# Patient Record
Sex: Female | Born: 1945 | Race: Black or African American | Hispanic: No | Smoking: Never smoker
Health system: Southern US, Community
[De-identification: ages and names within clinical notes are randomized; demographics above are authoritative.]

## PROBLEM LIST (undated history)

## (undated) DIAGNOSIS — N289 Disorder of kidney and ureter, unspecified: Secondary | ICD-10-CM

## (undated) DIAGNOSIS — I4891 Unspecified atrial fibrillation: Secondary | ICD-10-CM

## (undated) DIAGNOSIS — H269 Unspecified cataract: Secondary | ICD-10-CM

## (undated) DIAGNOSIS — I10 Essential (primary) hypertension: Secondary | ICD-10-CM

## (undated) DIAGNOSIS — I639 Cerebral infarction, unspecified: Secondary | ICD-10-CM

## (undated) DIAGNOSIS — H409 Unspecified glaucoma: Secondary | ICD-10-CM

---

## 2004-09-29 ENCOUNTER — Emergency Department (HOSPITAL_COMMUNITY): Admission: EM | Admit: 2004-09-29 | Discharge: 2004-09-29 | Payer: Self-pay | Admitting: Family Medicine

## 2004-11-19 ENCOUNTER — Encounter: Admission: RE | Admit: 2004-11-19 | Discharge: 2005-02-17 | Payer: Self-pay | Admitting: Internal Medicine

## 2007-05-20 ENCOUNTER — Emergency Department (HOSPITAL_COMMUNITY): Admission: EM | Admit: 2007-05-20 | Discharge: 2007-05-20 | Payer: Self-pay | Admitting: Emergency Medicine

## 2010-10-30 ENCOUNTER — Other Ambulatory Visit: Payer: Self-pay | Admitting: Orthopedic Surgery

## 2010-10-30 ENCOUNTER — Ambulatory Visit
Admission: RE | Admit: 2010-10-30 | Discharge: 2010-10-30 | Disposition: A | Discharge status: Home / Self Care | Payer: Medicare HMO | Source: Ambulatory Visit | Attending: Orthopedic Surgery | Admitting: Orthopedic Surgery

## 2010-10-30 DIAGNOSIS — M25569 Pain in unspecified knee: Secondary | ICD-10-CM

## 2010-10-30 DIAGNOSIS — M25559 Pain in unspecified hip: Secondary | ICD-10-CM

## 2011-05-22 LAB — DIFFERENTIAL
Basophils Absolute: 0
Eosinophils Absolute: 0.2
Eosinophils Relative: 3
Lymphocytes Relative: 35
Monocytes Relative: 11
Neutro Abs: 2.6

## 2011-05-22 LAB — CBC
Hemoglobin: 14.3
MCHC: 33.3
MCV: 88.6
RDW: 13.2
WBC: 5.2

## 2011-05-22 LAB — BASIC METABOLIC PANEL
CO2: 30
GFR calc Af Amer: >60
GFR calc non Af Amer: 54 — ABNORMAL LOW
Potassium: 4.2
Sodium: 139

## 2012-05-17 ENCOUNTER — Emergency Department (HOSPITAL_COMMUNITY)
Admission: EM | Admit: 2012-05-17 | Discharge: 2012-05-18 | Disposition: A | Discharge status: Home / Self Care | Payer: Medicare HMO | Attending: Emergency Medicine | Admitting: Emergency Medicine

## 2012-05-17 ENCOUNTER — Encounter (HOSPITAL_COMMUNITY): Payer: Self-pay | Admitting: *Deleted

## 2012-05-17 ENCOUNTER — Encounter (INDEPENDENT_AMBULATORY_CARE_PROVIDER_SITE_OTHER): Payer: Medicare HMO | Admitting: Ophthalmology

## 2012-05-17 DIAGNOSIS — I10 Essential (primary) hypertension: Secondary | ICD-10-CM

## 2012-05-17 DIAGNOSIS — I129 Hypertensive chronic kidney disease with stage 1 through stage 4 chronic kidney disease, or unspecified chronic kidney disease: Secondary | ICD-10-CM | POA: Insufficient documentation

## 2012-05-17 DIAGNOSIS — N183 Chronic kidney disease, stage 3 unspecified: Secondary | ICD-10-CM

## 2012-05-17 DIAGNOSIS — H40019 Open angle with borderline findings, low risk, unspecified eye: Secondary | ICD-10-CM

## 2012-05-17 DIAGNOSIS — E1139 Type 2 diabetes mellitus with other diabetic ophthalmic complication: Secondary | ICD-10-CM

## 2012-05-17 DIAGNOSIS — H35039 Hypertensive retinopathy, unspecified eye: Secondary | ICD-10-CM

## 2012-05-17 DIAGNOSIS — E119 Type 2 diabetes mellitus without complications: Secondary | ICD-10-CM

## 2012-05-17 DIAGNOSIS — H2510 Age-related nuclear cataract, unspecified eye: Secondary | ICD-10-CM

## 2012-05-17 DIAGNOSIS — H3581 Retinal edema: Secondary | ICD-10-CM

## 2012-05-17 DIAGNOSIS — E11319 Type 2 diabetes mellitus with unspecified diabetic retinopathy without macular edema: Secondary | ICD-10-CM

## 2012-05-17 DIAGNOSIS — H43819 Vitreous degeneration, unspecified eye: Secondary | ICD-10-CM

## 2012-05-17 HISTORY — DX: Unspecified glaucoma: H40.9

## 2012-05-17 HISTORY — DX: Essential (primary) hypertension: I10

## 2012-05-17 HISTORY — DX: Unspecified cataract: H26.9

## 2012-05-17 LAB — COMPREHENSIVE METABOLIC PANEL
ALT: 18 U/L (ref 0–35)
AST: 21 U/L (ref 0–37)
Albumin: 3.6 g/dL (ref 3.5–5.2)
Alkaline Phosphatase: 88 U/L (ref 39–117)
CO2: 29 mEq/L (ref 19–32)
Calcium: 10.1 mg/dL (ref 8.4–10.5)
GFR calc non Af Amer: 35 mL/min — ABNORMAL LOW (ref >90)
Glucose, Bld: 160 mg/dL — ABNORMAL HIGH (ref 70–99)
Sodium: 144 mEq/L (ref 135–145)

## 2012-05-17 LAB — CBC
HCT: 39.7 % (ref 36.0–46.0)
MCHC: 34 g/dL (ref 30.0–36.0)
Platelets: 390 10*3/uL (ref 150–400)
RBC: 4.58 MIL/uL (ref 3.87–5.11)

## 2012-05-17 MED ORDER — LABETALOL HCL 5 MG/ML IV SOLN
10.0000 mg | Freq: Once | INTRAVENOUS | Status: AC
  Administered 2012-05-17: 10 mg via INTRAVENOUS
  Filled 2012-05-17: qty 4

## 2012-05-17 NOTE — ED Provider Notes (Signed)
History     CSN: [id number]  Arrival date & time 05/17/12  1728   First MD Initiated Contact with Patient 05/17/12 2144      Chief Complaint  Patient presents with  . Hypertension    (Consider location/radiation/quality/duration/timing/severity/associated sxs/prior treatment) HPI  66 y.o. female in no acute distress sent here by Dr. Ashley Royalty her ophthalmologist who is evaluating her for her diabetic retinopathy for elevated blood pressure. Pressures in the office for 190s over the 110s. Patient is being treated for hypertension with amlodipine and hydrochlorothiazide she took both of her medications this a.m. Patient is asymptomatic: Denies chest pain, abdominal pain, palpitations, headache, change in vision, focal weakness,dysarthria, nausea/vomiting. Patient states that she recently went on vacation and has been eating very salty foods.  PCP: Dr Doristine Locks  Past Medical History  Diagnosis Date  . Hypertension   . Diabetes mellitus   . Glaucoma   . Cataract     History reviewed. No pertinent past surgical history.  No family history on file.  History  Substance Use Topics  . Smoking status: Never Smoker   . Smokeless tobacco: Not on file  . Alcohol Use: No    OB History    Grav Para Term Preterm Abortions TAB SAB Ect Mult Living                  Review of Systems  Constitutional: Negative for fever.  Respiratory: Negative for shortness of breath.   Cardiovascular: Negative for chest pain.  Gastrointestinal: Negative for nausea, vomiting, abdominal pain and diarrhea.  All other systems reviewed and are negative.    Allergies  Review of patient's allergies indicates no known allergies.  Home Medications   Current Outpatient Rx  Name Route Sig Dispense Refill  . AMLODIPINE BESY-BENAZEPRIL HCL 10-20 MG PO CAPS Oral Take 1 capsule by mouth daily.    Marland Kitchen HYDROCHLOROTHIAZIDE 25 MG PO TABS Oral Take 25 mg by mouth daily.    Marland Kitchen METFORMIN HCL 500 MG PO TABS Oral Take  500 mg by mouth 2 (two) times daily with a meal.      BP 194/114  Pulse 94  Temp 97.8 F (36.6 C) (Oral)  Resp 20  SpO2 98%  Physical Exam  Nursing note and vitals reviewed. Constitutional: She is oriented to person, place, and time. She appears well-developed and well-nourished. No distress.  HENT:  Head: Normocephalic.  Eyes: Conjunctivae normal and EOM are normal.  Cardiovascular: Normal rate, regular rhythm, normal heart sounds and intact distal pulses.  Exam reveals no gallop and no friction rub.   No murmur heard.      Pressure is 160s over 100 at evaluation  Pulmonary/Chest: Effort normal and breath sounds normal. No stridor. No respiratory distress. She has no wheezes. She has no rales. She exhibits no tenderness.  Abdominal: Soft. Bowel sounds are normal. She exhibits no distension and no mass. There is no tenderness. There is no rebound and no guarding.  Musculoskeletal: Normal range of motion.  Neurological: She is alert and oriented to person, place, and time.       Cranial nerves III through XII intact, strength 5 out of 5x4 extremities, negative pronator drift, finger to nose and heel-to-shin coordinated, sensation intact to pinprick and light touch, gait is coordinated and Romberg is negative.   Psychiatric: She has a normal mood and affect.    ED Course  Procedures (including critical care time)  Labs Reviewed  COMPREHENSIVE METABOLIC PANEL - Abnormal; Notable for  the following:    Glucose, Bld 160 (*)     BUN 33 (*)     Creatinine, Ser 1.55 (*)     GFR calc non Af Amer 35 (*)     GFR calc Af Amer 40 (*)     All other components within normal limits  CBC   No results found.   1. Hypertension   2. CKD (chronic kidney disease) stage 3, GFR 30-59 ml/min   3. Diabetes mellitus       MDM  Asymptomatic elevated blood pressure.   Creatinine is elevated to 1.55 baseline is unknown. Urinalysis shows proteinuria at greater than 300. Advised patient that she  needs to follow with her primary care doctor  for optimal sugar and blood pressure control. Patient voiced understanding.    Pt verbalized understanding and agrees with care plan. Outpatient follow-up and return precautions given.           Wynetta Emery, PA-C 05/18/12 [phone]

## 2012-05-17 NOTE — ED Notes (Signed)
Pt. States came from eye doctor with elevated BP. States takes hydrochlorothiazide and amlodipine for BP daily but states my bp has been high for a while".

## 2012-05-17 NOTE — ED Notes (Signed)
Pt was at dr. Ashley Royalty office and awaiting an eye procedure and told to come here because blood pressure too high..  Pt states she is having problems with vision but came after he dilated her eyes.  No stroke symptoms.  Pt takes bp medications

## 2012-05-18 LAB — URINALYSIS, ROUTINE W REFLEX MICROSCOPIC
Bilirubin Urine: NEGATIVE
Glucose, UA: NEGATIVE mg/dL
Protein, ur: >300 mg/dL — AB

## 2012-05-18 LAB — URINE MICROSCOPIC-ADD ON

## 2012-05-20 NOTE — ED Provider Notes (Signed)
Medical screening examination/treatment/procedure(s) were performed by non-physician practitioner and as supervising physician I was immediately available for consultation/collaboration.   Dione Booze, MD 05/20/12 [phone]

## 2012-05-24 ENCOUNTER — Other Ambulatory Visit: Payer: Self-pay | Admitting: Family Medicine

## 2012-05-24 DIAGNOSIS — I10 Essential (primary) hypertension: Secondary | ICD-10-CM

## 2012-05-29 ENCOUNTER — Ambulatory Visit (HOSPITAL_COMMUNITY)
Admission: RE | Admit: 2012-05-29 | Discharge: 2012-05-29 | Disposition: A | Discharge status: Home / Self Care | Payer: Medicare HMO | Source: Ambulatory Visit | Attending: Family Medicine | Admitting: Family Medicine

## 2012-05-29 ENCOUNTER — Other Ambulatory Visit: Payer: Medicare HMO

## 2012-05-29 DIAGNOSIS — I10 Essential (primary) hypertension: Secondary | ICD-10-CM

## 2012-06-04 ENCOUNTER — Other Ambulatory Visit (INDEPENDENT_AMBULATORY_CARE_PROVIDER_SITE_OTHER): Payer: Medicare HMO | Admitting: Ophthalmology

## 2012-06-04 DIAGNOSIS — E1139 Type 2 diabetes mellitus with other diabetic ophthalmic complication: Secondary | ICD-10-CM

## 2012-06-04 DIAGNOSIS — E11359 Type 2 diabetes mellitus with proliferative diabetic retinopathy without macular edema: Secondary | ICD-10-CM

## 2012-06-24 ENCOUNTER — Other Ambulatory Visit (INDEPENDENT_AMBULATORY_CARE_PROVIDER_SITE_OTHER): Payer: Medicare HMO | Admitting: Ophthalmology

## 2012-06-30 ENCOUNTER — Other Ambulatory Visit (INDEPENDENT_AMBULATORY_CARE_PROVIDER_SITE_OTHER): Payer: Medicare HMO | Admitting: Ophthalmology

## 2012-11-17 ENCOUNTER — Encounter (HOSPITAL_COMMUNITY): Payer: Self-pay | Admitting: *Deleted

## 2012-11-17 ENCOUNTER — Inpatient Hospital Stay (HOSPITAL_COMMUNITY)
Admission: EM | Admit: 2012-11-17 | Discharge: 2012-11-24 | DRG: 064 | Disposition: A | Discharge status: Rehabilitation Facility | Payer: PRIVATE HEALTH INSURANCE | Attending: Internal Medicine | Admitting: Internal Medicine

## 2012-11-17 DIAGNOSIS — H53469 Homonymous bilateral field defects, unspecified side: Secondary | ICD-10-CM | POA: Diagnosis present

## 2012-11-17 DIAGNOSIS — N189 Chronic kidney disease, unspecified: Secondary | ICD-10-CM

## 2012-11-17 DIAGNOSIS — K5900 Constipation, unspecified: Secondary | ICD-10-CM | POA: Diagnosis present

## 2012-11-17 DIAGNOSIS — N058 Unspecified nephritic syndrome with other morphologic changes: Secondary | ICD-10-CM | POA: Diagnosis present

## 2012-11-17 DIAGNOSIS — Z7901 Long term (current) use of anticoagulants: Secondary | ICD-10-CM

## 2012-11-17 DIAGNOSIS — I639 Cerebral infarction, unspecified: Secondary | ICD-10-CM

## 2012-11-17 DIAGNOSIS — R29810 Facial weakness: Secondary | ICD-10-CM | POA: Diagnosis present

## 2012-11-17 DIAGNOSIS — R519 Headache, unspecified: Secondary | ICD-10-CM | POA: Diagnosis present

## 2012-11-17 DIAGNOSIS — E1169 Type 2 diabetes mellitus with other specified complication: Secondary | ICD-10-CM | POA: Diagnosis present

## 2012-11-17 DIAGNOSIS — I4891 Unspecified atrial fibrillation: Secondary | ICD-10-CM

## 2012-11-17 DIAGNOSIS — Z79899 Other long term (current) drug therapy: Secondary | ICD-10-CM

## 2012-11-17 DIAGNOSIS — Z823 Family history of stroke: Secondary | ICD-10-CM

## 2012-11-17 DIAGNOSIS — I509 Heart failure, unspecified: Secondary | ICD-10-CM

## 2012-11-17 DIAGNOSIS — Z8249 Family history of ischemic heart disease and other diseases of the circulatory system: Secondary | ICD-10-CM

## 2012-11-17 DIAGNOSIS — I6340 Cerebral infarction due to embolism of unspecified cerebral artery: Principal | ICD-10-CM | POA: Diagnosis present

## 2012-11-17 DIAGNOSIS — I129 Hypertensive chronic kidney disease with stage 1 through stage 4 chronic kidney disease, or unspecified chronic kidney disease: Secondary | ICD-10-CM | POA: Diagnosis present

## 2012-11-17 DIAGNOSIS — R4701 Aphasia: Secondary | ICD-10-CM | POA: Diagnosis present

## 2012-11-17 DIAGNOSIS — N183 Chronic kidney disease, stage 3 unspecified: Secondary | ICD-10-CM

## 2012-11-17 DIAGNOSIS — E119 Type 2 diabetes mellitus without complications: Secondary | ICD-10-CM

## 2012-11-17 DIAGNOSIS — N184 Chronic kidney disease, stage 4 (severe): Secondary | ICD-10-CM | POA: Diagnosis present

## 2012-11-17 DIAGNOSIS — Z833 Family history of diabetes mellitus: Secondary | ICD-10-CM

## 2012-11-17 DIAGNOSIS — G8190 Hemiplegia, unspecified affecting unspecified side: Secondary | ICD-10-CM | POA: Diagnosis present

## 2012-11-17 DIAGNOSIS — I169 Hypertensive crisis, unspecified: Secondary | ICD-10-CM

## 2012-11-17 DIAGNOSIS — H409 Unspecified glaucoma: Secondary | ICD-10-CM | POA: Diagnosis present

## 2012-11-17 DIAGNOSIS — I10 Essential (primary) hypertension: Secondary | ICD-10-CM | POA: Diagnosis present

## 2012-11-17 DIAGNOSIS — I674 Hypertensive encephalopathy: Secondary | ICD-10-CM | POA: Diagnosis present

## 2012-11-17 DIAGNOSIS — H02409 Unspecified ptosis of unspecified eyelid: Secondary | ICD-10-CM | POA: Diagnosis present

## 2012-11-17 DIAGNOSIS — Z6841 Body Mass Index (BMI) 40.0 and over, adult: Secondary | ICD-10-CM

## 2012-11-17 DIAGNOSIS — Z794 Long term (current) use of insulin: Secondary | ICD-10-CM

## 2012-11-17 DIAGNOSIS — Z7982 Long term (current) use of aspirin: Secondary | ICD-10-CM

## 2012-11-17 DIAGNOSIS — G936 Cerebral edema: Secondary | ICD-10-CM | POA: Diagnosis present

## 2012-11-17 DIAGNOSIS — H532 Diplopia: Secondary | ICD-10-CM | POA: Diagnosis present

## 2012-11-17 DIAGNOSIS — R51 Headache: Secondary | ICD-10-CM

## 2012-11-17 DIAGNOSIS — E1129 Type 2 diabetes mellitus with other diabetic kidney complication: Secondary | ICD-10-CM | POA: Diagnosis present

## 2012-11-17 MED ORDER — DILTIAZEM HCL 25 MG/5ML IV SOLN
10.0000 mg | Freq: Once | INTRAVENOUS | Status: AC
  Administered 2012-11-18: 10 mg via INTRAVENOUS
  Filled 2012-11-17: qty 5

## 2012-11-17 MED ORDER — DILTIAZEM HCL 100 MG IV SOLR
5.0000 mg/h | INTRAVENOUS | Status: DC
  Administered 2012-11-18 (×2): 5 mg/h via INTRAVENOUS

## 2012-11-17 NOTE — ED Provider Notes (Signed)
History  This chart was scribed for Heather Rosch Smitty Cords, MD by Shari Heritage, ED Scribe. The patient was seen in room D30C/D30C. Patient's care was started at 2318.   CSN: [id number]  Arrival date & time 11/17/12  2315   First MD Initiated Contact with Patient 11/17/12 2318      Chief Complaint  Patient presents with  . Hypertension     Patient is a 67 y.o. female presenting with hypertension and palpitations. The history is limited by the condition of the patient.  Hypertension This is a chronic problem. The problem occurs constantly. The problem has been gradually worsening. She has tried nothing for the symptoms. The treatment provided no relief.  Palpitations  This is a new problem. The current episode started less than 1 hour ago. The problem has not changed since onset.The problem is associated with an unknown factor. Treatments tried: Cardizem. Her past medical history does not include anemia, heart disease, hyperthyroidism or valve disorder.    HPI Comments - Level 5 Caveat, Full history could not be obtained due to patient's condition.  Level 5 Caveat due to patient's condition.  [name] is a 67 y.o. female with a history of hypertension, diabetes and glaucoma who presents to the Emergency Department complaining of hypertension and atrial fibrillation onset a few hours ago. EMS was called to patient's home after she complained of a severe headache and high BP. Per EMT, patient's BP at the scene was 193/139, CBG 86, HR 109 irregular, O2 sat 100% on 2 L/min . Upon arriving here in the ED, EMS noted that patient was in atrial fibrillation. Patient has no personal history of atrial fibrillation, but her sister does. Patient is currently taking metformin 500 mg 2x daily, hydrochlorothiazide 25 mg, amlodipine-benazepril 10-20mg  daily, metoprolol succinate 50 mg daily.   Past Medical History  Diagnosis Date  . Hypertension   . Diabetes mellitus   . Glaucoma   . Cataract     No  past surgical history on file.  No family history on file.  History  Substance Use Topics  . Smoking status: Never Smoker   . Smokeless tobacco: Not on file  . Alcohol Use: No    OB History   Grav Para Term Preterm Abortions TAB SAB Ect Mult Living                  Review of Systems  Unable to perform ROS: Acuity of condition  Cardiovascular: Positive for palpitations.  Level 5 Caveat - Full ROS could not be obtained due to the condition of the patient.  Allergies  Review of patient's allergies indicates no known allergies.  Home Medications   Current Outpatient Rx  Name  Route  Sig  Dispense  Refill  . metFORMIN (GLUCOPHAGE) 500 MG tablet   Oral   Take 500 mg by mouth 2 (two) times daily with a meal.         . amLODipine-benazepril (LOTREL) 10-20 MG per capsule   Oral   Take 1 capsule by mouth daily.         . hydrochlorothiazide (HYDRODIURIL) 25 MG tablet   Oral   Take 25 mg by mouth daily.         . metoprolol succinate (TOPROL-XL) 50 MG 24 hr tablet   Oral   Take 50 mg by mouth daily. Take with or immediately following a meal.           Triage Vitals: BP 170/127  Pulse 153  Temp(Src) 98.3 F (36.8 C) (Oral)  Resp 20  SpO2 99%  Physical Exam  Constitutional: She appears well-developed and well-nourished.  HENT:  Head: Normocephalic and atraumatic.  Mouth/Throat: Oropharynx is clear and moist.  Eyes: Conjunctivae and EOM are normal. Pupils are equal, round, and reactive to light.  Neck: Normal range of motion. Neck supple. No tracheal deviation present.  Cardiovascular: Intact distal pulses.  An irregularly irregular rhythm present. Tachycardia present.   Pulmonary/Chest: Effort normal and breath sounds normal. No respiratory distress. She has no wheezes. She has no rales.  Abdominal: Soft. Bowel sounds are normal. She exhibits no distension. There is no tenderness. There is no rebound and no guarding.  Musculoskeletal: Normal range of motion.   Neurological: She is alert. She has normal reflexes. No cranial nerve deficit.  Skin: Skin is warm and dry. No rash noted. No erythema.  Psychiatric: She has a normal mood and affect.    ED Course  Procedures (including critical care time) DIAGNOSTIC STUDIES: Oxygen Saturation is 99% on room air, normal by my interpretation.    COORDINATION OF CARE: 11:28 PM- Patient informed of current plan for treatment and evaluation and agrees with plan at this time.    Labs Reviewed  CBC WITH DIFFERENTIAL - Abnormal; Notable for the following:    Monocytes Absolute 1.1 (*)    All other components within normal limits  POCT I-STAT, CHEM 8 - Abnormal; Notable for the following:    BUN 48 (*)    Creatinine, Ser 1.70 (*)    Glucose, Bld 148 (*)    All other components within normal limits  PROTIME-INR  POCT I-STAT TROPONIN I     Dg Chest 2 View  11/18/2012  *RADIOLOGY REPORT*  Clinical Data: Hypertension.  Lethargy.  CHEST - 2 VIEW  Comparison: Two-view chest 05/20/2007.  Findings: Mild cardiac enlargement is noted.  Pulmonary vascular congestion is present.  No focal airspace consolidation is evident. Mild exaggerated thoracic kyphosis is present.  IMPRESSION:  1.  Mild cardiomegaly and pulmonary vascular congestion suggesting early congestive heart failure. 2.  No significant airspace consolidation.   Original Report Authenticated By: Marin Roberts, M.D.    Ct Head Wo Contrast  11/18/2012  *RADIOLOGY REPORT*  Clinical Data: Hypertension; severe headache.  CT HEAD WITHOUT CONTRAST  Technique:  Contiguous axial images were obtained from the base of the skull through the vertex without contrast.  Comparison: None.  Findings: There is no evidence of acute infarction, mass lesion, or intra- or extra-axial hemorrhage on CT.  Scattered periventricular and subcortical white matter change likely reflects small vessel ischemic microangiopathy.  Small chronic lacunar infarcts are noted at the thalami  bilaterally. There is prominent calcification along the anterior falx cerebri.  The posterior fossa, including the cerebellum, brainstem and fourth ventricle, is within normal limits.  The third and lateral ventricles, and basal ganglia are unremarkable in appearance.  The cerebral hemispheres demonstrate grossly normal gray-white differentiation.  No mass effect or midline shift is seen.  There is no evidence of fracture; visualized osseous structures are unremarkable in appearance.  The visualized portions of the orbits are within normal limits.  The paranasal sinuses and mastoid air cells are well-aerated.  No significant soft tissue abnormalities are seen.  IMPRESSION:  1.  No acute intracranial pathology seen on CT. 2.  Scattered small vessel ischemic microangiopathy. 3.  Small chronic lacunar infarcts at the thalami bilaterally.   Original Report Authenticated By: Tonia Ghent, M.D.  No diagnosis found.    MDM  CRITICAL CARE Performed by: Jasmine Awe   Total critical care time: 60 minutes  Critical care time was exclusive of separately billable procedures and treating other patients.  Critical care was necessary to treat or prevent imminent or life-threatening deterioration.  Critical care was time spent personally by me on the following activities: development of treatment plan with patient and/or surrogate as well as nursing, discussions with consultants, evaluation of patient's response to treatment, examination of patient, obtaining history from patient or surrogate, ordering and performing treatments and interventions, ordering and review of laboratory studies, ordering and review of radiographic studies, pulse oximetry and re-evaluation of patient's condition. MDM Reviewed: previous chart, nursing note and vitals Interpretation: labs, ECG, x-ray and CT scan Total time providing critical care: 30-74 minutes. This excludes time spent performing separately reportable  procedures and services. Consults: admitting MD   Medications  diltiazem (CARDIZEM) 100 mg in dextrose 5 % 100 mL infusion (5 mg/hr Intravenous New Bag/Given 11/18/12 0041)  diltiazem (CARDIZEM) injection 10 mg (0 mg Intravenous Stopped 11/18/12 0045)  furosemide (LASIX) injection 40 mg (40 mg Intravenous Given 11/18/12 0110)         I personally performed the services described in this documentation, which was scribed in my presence. The recorded information has been reviewed and is accurate.    Jasmine Awe, MD 11/18/12 [phone]

## 2012-11-17 NOTE — ED Notes (Signed)
Pt arrived from home via [facility] c/o HA. On scene EMS found her in Afib, BP  193/139, CBG 87,  HR 109 irregular, O2 sat 100% on 2 L/min.

## 2012-11-18 ENCOUNTER — Inpatient Hospital Stay (HOSPITAL_COMMUNITY): Payer: PRIVATE HEALTH INSURANCE

## 2012-11-18 ENCOUNTER — Emergency Department (HOSPITAL_COMMUNITY): Payer: PRIVATE HEALTH INSURANCE

## 2012-11-18 ENCOUNTER — Encounter (HOSPITAL_COMMUNITY): Payer: Self-pay | Admitting: Radiology

## 2012-11-18 DIAGNOSIS — I10 Essential (primary) hypertension: Secondary | ICD-10-CM

## 2012-11-18 DIAGNOSIS — N189 Chronic kidney disease, unspecified: Secondary | ICD-10-CM

## 2012-11-18 DIAGNOSIS — R51 Headache: Secondary | ICD-10-CM | POA: Diagnosis present

## 2012-11-18 DIAGNOSIS — I639 Cerebral infarction, unspecified: Secondary | ICD-10-CM | POA: Diagnosis present

## 2012-11-18 DIAGNOSIS — I6350 Cerebral infarction due to unspecified occlusion or stenosis of unspecified cerebral artery: Secondary | ICD-10-CM

## 2012-11-18 DIAGNOSIS — I4891 Unspecified atrial fibrillation: Secondary | ICD-10-CM

## 2012-11-18 DIAGNOSIS — E119 Type 2 diabetes mellitus without complications: Secondary | ICD-10-CM

## 2012-11-18 DIAGNOSIS — E1129 Type 2 diabetes mellitus with other diabetic kidney complication: Secondary | ICD-10-CM | POA: Diagnosis present

## 2012-11-18 DIAGNOSIS — R519 Headache, unspecified: Secondary | ICD-10-CM | POA: Diagnosis present

## 2012-11-18 DIAGNOSIS — N184 Chronic kidney disease, stage 4 (severe): Secondary | ICD-10-CM | POA: Diagnosis present

## 2012-11-18 LAB — BASIC METABOLIC PANEL
BUN: 31 mg/dL — ABNORMAL HIGH (ref 6–23)
Chloride: 100 mEq/L (ref 96–112)
GFR calc Af Amer: 42 mL/min — ABNORMAL LOW (ref >90)
GFR calc non Af Amer: 36 mL/min — ABNORMAL LOW (ref >90)
Potassium: 3.4 mEq/L — ABNORMAL LOW (ref 3.5–5.1)
Sodium: 140 mEq/L (ref 135–145)

## 2012-11-18 LAB — POCT I-STAT TROPONIN I: Troponin i, poc: 0.02 ng/mL (ref 0.00–0.08)

## 2012-11-18 LAB — CBC WITH DIFFERENTIAL/PLATELET
Basophils Absolute: 0 10*3/uL (ref 0.0–0.1)
Basophils Absolute: 0 10*3/uL (ref 0.0–0.1)
Eosinophils Relative: 0 % (ref 0–5)
HCT: 36.1 % (ref 36.0–46.0)
HCT: 36.4 % (ref 36.0–46.0)
Hemoglobin: 12.2 g/dL (ref 12.0–15.0)
Hemoglobin: 12.2 g/dL (ref 12.0–15.0)
Lymphocytes Relative: 14 % (ref 12–46)
Lymphocytes Relative: 21 % (ref 12–46)
Lymphs Abs: 1.3 10*3/uL (ref 0.7–4.0)
MCV: 87 fL (ref 78.0–100.0)
Monocytes Absolute: 0.4 10*3/uL (ref 0.1–1.0)
Monocytes Absolute: 1.1 10*3/uL — ABNORMAL HIGH (ref 0.1–1.0)
Monocytes Relative: 5 % (ref 3–12)
Neutro Abs: 6.5 10*3/uL (ref 1.7–7.7)
Neutrophils Relative %: 66 % (ref 43–77)
RDW: 13.2 % (ref 11.5–15.5)
RDW: 13.2 % (ref 11.5–15.5)
WBC: 8.7 10*3/uL (ref 4.0–10.5)
WBC: 9.9 10*3/uL (ref 4.0–10.5)

## 2012-11-18 LAB — COMPREHENSIVE METABOLIC PANEL
ALT: 19 U/L (ref 0–35)
Albumin: 3.7 g/dL (ref 3.5–5.2)
Alkaline Phosphatase: 69 U/L (ref 39–117)
BUN: 38 mg/dL — ABNORMAL HIGH (ref 6–23)
Calcium: 9.9 mg/dL (ref 8.4–10.5)
Potassium: 4.5 mEq/L (ref 3.5–5.1)
Sodium: 138 mEq/L (ref 135–145)
Total Protein: 8.4 g/dL — ABNORMAL HIGH (ref 6.0–8.3)

## 2012-11-18 LAB — GLUCOSE, CAPILLARY
Glucose-Capillary: 163 mg/dL — ABNORMAL HIGH (ref 70–99)
Glucose-Capillary: 98 mg/dL (ref 70–99)
Glucose-Capillary: 122 mg/dL — ABNORMAL HIGH (ref 70–99)

## 2012-11-18 LAB — POCT I-STAT, CHEM 8
Creatinine, Ser: 1.7 mg/dL — ABNORMAL HIGH (ref 0.50–1.10)
HCT: 39 % (ref 36.0–46.0)
Hemoglobin: 13.3 g/dL (ref 12.0–15.0)
Potassium: 4.2 mEq/L (ref 3.5–5.1)
Sodium: 140 mEq/L (ref 135–145)

## 2012-11-18 LAB — RAPID URINE DRUG SCREEN, HOSP PERFORMED: Opiates: NOT DETECTED

## 2012-11-18 LAB — PROTIME-INR
INR: 1.01 (ref 0.00–1.49)
Prothrombin Time: 13.2 seconds (ref 11.6–15.2)

## 2012-11-18 LAB — HEMOGLOBIN A1C
Hgb A1c MFr Bld: 6.1 % — ABNORMAL HIGH (ref <5.7)
Mean Plasma Glucose: 128 mg/dL — ABNORMAL HIGH (ref <117)

## 2012-11-18 MED ORDER — ACETAMINOPHEN 650 MG RE SUPP: 650.0000 mg | Freq: Four times a day (QID) | RECTAL | Status: DC | PRN

## 2012-11-18 MED ORDER — SODIUM CHLORIDE 0.9 % IV SOLN
INTRAVENOUS | Status: DC
  Administered 2012-11-18 (×2): 10 mL/h via INTRAVENOUS

## 2012-11-18 MED ORDER — IOHEXOL 350 MG/ML SOLN
50.0000 mL | Freq: Once | INTRAVENOUS | Status: AC | PRN
  Administered 2012-11-18: 50 mL via INTRAVENOUS

## 2012-11-18 MED ORDER — NICARDIPINE HCL 2.5 MG/ML IV SOLN: 5.0000 mg/h | INTRAVENOUS | Status: DC

## 2012-11-18 MED ORDER — METOPROLOL TARTRATE 50 MG PO TABS
50.0000 mg | ORAL_TABLET | Freq: Two times a day (BID) | ORAL | Status: DC
  Administered 2012-11-18 (×2): 50 mg via ORAL
  Filled 2012-11-18: qty 1
  Filled 2012-11-18: qty 2
  Filled 2012-11-18: qty 1

## 2012-11-18 MED ORDER — BENAZEPRIL HCL 20 MG PO TABS
20.0000 mg | ORAL_TABLET | Freq: Every day | ORAL | Status: DC
  Filled 2012-11-18: qty 1

## 2012-11-18 MED ORDER — SODIUM BICARBONATE 8.4 % IV SOLN
INTRAVENOUS | Status: AC
  Administered 2012-11-18: 17:00:00 via INTRAVENOUS
  Filled 2012-11-18 (×2): qty 850

## 2012-11-18 MED ORDER — SODIUM CHLORIDE 0.9 % IJ SOLN
3.0000 mL | Freq: Two times a day (BID) | INTRAMUSCULAR | Status: DC
  Administered 2012-11-18 – 2012-11-20 (×5): 3 mL via INTRAVENOUS
  Administered 2012-11-20: 09:00:00 via INTRAVENOUS
  Administered 2012-11-21 – 2012-11-24 (×7): 3 mL via INTRAVENOUS

## 2012-11-18 MED ORDER — IOHEXOL 350 MG/ML SOLN: 80.0000 mL | Freq: Once | INTRAVENOUS | Status: DC | PRN

## 2012-11-18 MED ORDER — SODIUM CHLORIDE 0.9 % IV SOLN: INTRAVENOUS | Status: DC

## 2012-11-18 MED ORDER — SODIUM CHLORIDE 0.9 % IJ SOLN
3.0000 mL | Freq: Two times a day (BID) | INTRAMUSCULAR | Status: DC
  Administered 2012-11-18 – 2012-11-22 (×7): 3 mL via INTRAVENOUS

## 2012-11-18 MED ORDER — FUROSEMIDE 10 MG/ML IJ SOLN
40.0000 mg | Freq: Once | INTRAMUSCULAR | Status: AC
  Administered 2012-11-18: 40 mg via INTRAVENOUS
  Filled 2012-11-18: qty 4

## 2012-11-18 MED ORDER — AMLODIPINE BESYLATE 10 MG PO TABS
10.0000 mg | ORAL_TABLET | Freq: Every day | ORAL | Status: DC
  Administered 2012-11-18: 10 mg via ORAL
  Filled 2012-11-18: qty 1

## 2012-11-18 MED ORDER — AMLODIPINE BESY-BENAZEPRIL HCL 10-20 MG PO CAPS: 1.0000 | ORAL_CAPSULE | Freq: Every day | ORAL | Status: DC

## 2012-11-18 MED ORDER — NICARDIPINE HCL IN NACL 20-0.86 MG/200ML-% IV SOLN
5.0000 mg/h | INTRAVENOUS | Status: DC
  Administered 2012-11-18: 2 mg/h via INTRAVENOUS
  Filled 2012-11-18: qty 200

## 2012-11-18 MED ORDER — DEXTROSE 5 % IV SOLN
15.0000 mg/kg/h | INTRAVENOUS | Status: DC
  Filled 2012-11-18: qty 200

## 2012-11-18 MED ORDER — INSULIN ASPART 100 UNIT/ML ~~LOC~~ SOLN
0.0000 [IU] | Freq: Three times a day (TID) | SUBCUTANEOUS | Status: DC
  Administered 2012-11-18 – 2012-11-19 (×3): 2 [IU] via SUBCUTANEOUS
  Administered 2012-11-19 – 2012-11-22 (×8): 1 [IU] via SUBCUTANEOUS
  Administered 2012-11-22 – 2012-11-23 (×3): 2 [IU] via SUBCUTANEOUS
  Administered 2012-11-23: 1 [IU] via SUBCUTANEOUS
  Administered 2012-11-24: 2 [IU] via SUBCUTANEOUS
  Administered 2012-11-24: 1 [IU] via SUBCUTANEOUS

## 2012-11-18 MED ORDER — ONDANSETRON HCL 4 MG/2ML IJ SOLN: 4.0000 mg | Freq: Four times a day (QID) | INTRAMUSCULAR | Status: DC | PRN

## 2012-11-18 MED ORDER — HYDRALAZINE HCL 20 MG/ML IJ SOLN
10.0000 mg | INTRAMUSCULAR | Status: DC | PRN
  Administered 2012-11-18: 10 mg via INTRAVENOUS
  Filled 2012-11-18: qty 1

## 2012-11-18 MED ORDER — ONDANSETRON HCL 4 MG PO TABS: 4.0000 mg | ORAL_TABLET | Freq: Four times a day (QID) | ORAL | Status: DC | PRN

## 2012-11-18 MED ORDER — HYDROCHLOROTHIAZIDE 25 MG PO TABS
25.0000 mg | ORAL_TABLET | Freq: Every day | ORAL | Status: DC
  Administered 2012-11-18: 25 mg via ORAL
  Filled 2012-11-18: qty 1

## 2012-11-18 MED ORDER — METOPROLOL TARTRATE 1 MG/ML IV SOLN
5.0000 mg | INTRAVENOUS | Status: DC | PRN
  Administered 2012-11-19: 5 mg via INTRAVENOUS
  Filled 2012-11-18: qty 5

## 2012-11-18 MED ORDER — NICARDIPINE HCL IN NACL 40-0.83 MG/200ML-% IV SOLN
5.0000 mg/h | INTRAVENOUS | Status: DC
  Administered 2012-11-18 – 2012-11-19 (×2): 5 mg/h via INTRAVENOUS
  Filled 2012-11-18 (×2): qty 200

## 2012-11-18 MED ORDER — ACETAMINOPHEN 325 MG PO TABS
650.0000 mg | ORAL_TABLET | Freq: Four times a day (QID) | ORAL | Status: DC | PRN
  Administered 2012-11-22: 650 mg via ORAL
  Filled 2012-11-18: qty 2

## 2012-11-18 MED ORDER — TRAMADOL HCL 50 MG PO TABS
50.0000 mg | ORAL_TABLET | Freq: Four times a day (QID) | ORAL | Status: DC | PRN
  Administered 2012-11-18: 50 mg via ORAL
  Filled 2012-11-18 (×2): qty 1

## 2012-11-18 NOTE — Consult Note (Signed)
Referring Physician: Butler Denmark    Chief Complaint: confusion, HA , HTN  HPI:                                                                                                                                         [name] is an 67 y.o. female who was brought into the hospital intialy due to experiencing headache last evening. It was also noted she had elevated BP by family. EMS was called and patient was brought to the ER. Patient blood pressure was found to be 172/127 and patient was found to be tachycardic and in A. fib with RVR. CT of the head was negative for acute infarct.  At this time patient was started on Cardizem infusion and admitted.  Due to increased complaints of vision neurology was called.  Patient was sent to MRI and findings large acute left PCA infarct and small acute right PCA cortical infarct involving the occipital pole.    Date last known well: 4.9.14 Time last known well: 2000 tPA Given: No: Out of window  Past Medical History  Diagnosis Date  . Hypertension   . Diabetes mellitus   . Glaucoma   . Cataract     History reviewed. No pertinent past surgical history.  Family History  Problem Relation Age of Onset  . Stroke Mother   . Diabetes type II Mother   . CAD Father    Social History:  reports that she has never smoked. She does not have any smokeless tobacco history on file. She reports that she does not drink alcohol or use illicit drugs.  Allergies: No Known Allergies  Medications:                                                                                                                           Prior to Admission:  Prescriptions prior to admission  Medication Sig Dispense Refill  . amLODipine-benazepril (LOTREL) 10-20 MG per capsule Take 1 capsule by mouth daily.      . hydrochlorothiazide (HYDRODIURIL) 25 MG tablet Take 25 mg by mouth daily.      . metFORMIN (GLUCOPHAGE) 500 MG tablet Take 500 mg by mouth 2 (two) times daily with a meal.       . metoprolol succinate (TOPROL-XL) 50 MG 24 hr tablet Take 50 mg by mouth daily. Take with or immediately following  a meal.       Scheduled: . insulin aspart  0-9 Units Subcutaneous TID WC  . sodium chloride  3 mL Intravenous Q12H  . sodium chloride  3 mL Intravenous Q12H    ROS:                                                                                                                                       History obtained from unobtainable from patient due to confusion   Neurologic Examination:                                                                                                      Blood pressure 160/116, pulse 98, temperature 98.5 F (36.9 C), temperature source Oral, resp. rate 16, height 5' (1.524 m), weight 110.6 kg (243 lb 13.3 oz), SpO2 100.00%.   Mental Status: Alert, confused, able to follow some commands but when asked questions she does not answer with appropriate answers.   Speech fluent without evidence of aphasia.  Able to follow simple commands. Cranial Nerves: II: Discs flat bilaterally; Visual fields difficult to assess, she does not blink to threat bilaterally and cannot count fingers when held in front of her. pupils equal, round, reactive to light III,IV, VI: ptosis not present, patient looks to left and right when asked to look toward the voice when her name is called. V,VII: smile symmetric, facial light touch sensation normal bilaterally VIII: hearing normal bilaterally IX,X: gag reflex present XI: bilateral shoulder shrug XII: midline tongue extension Motor: Right : Upper extremity   4/5    Left:     Upper extremity   5/5  Lower extremity   (note)    Lower extremity   5/5 --right hip has fracture and she has decreased strength in proximal muscles due to pain Tone and bulk:normal tone throughout; no atrophy noted Sensory: Pinprick and light touch intact throughout, bilaterally Deep Tendon Reflexes: 2+ and symmetric throughout UE, bilateral KJ  and AJ not present Plantars: Right: downgoing   Left: downgoing CV: pulses palpable throughout   Results for orders placed during the hospital encounter of 11/17/12 (from the past 48 hour(s))  CBC WITH DIFFERENTIAL     Status: Abnormal   Collection Time    11/17/12 11:47 PM      Result Value Range   WBC 9.9  4.0 - 10.5 K/uL   RBC 4.18  3.87 - 5.11 MIL/uL   Hemoglobin 12.2  12.0 - 15.0 g/dL  HCT 36.4  36.0 - 46.0 %   MCV 87.1  78.0 - 100.0 fL   MCH 29.2  26.0 - 34.0 pg   MCHC 33.5  30.0 - 36.0 g/dL   RDW 16.1  09.6 - 04.5 %   Platelets 348  150 - 400 K/uL   Neutrophils Relative 66  43 - 77 %   Neutro Abs 6.5  1.7 - 7.7 K/uL   Lymphocytes Relative 21  12 - 46 %   Lymphs Abs 2.0  0.7 - 4.0 K/uL   Monocytes Relative 11  3 - 12 %   Monocytes Absolute 1.1 (*) 0.1 - 1.0 K/uL   Eosinophils Relative 2  0 - 5 %   Eosinophils Absolute 0.2  0.0 - 0.7 K/uL   Basophils Relative 0  0 - 1 %   Basophils Absolute 0.0  0.0 - 0.1 K/uL  PROTIME-INR     Status: None   Collection Time    11/17/12 11:47 PM      Result Value Range   Prothrombin Time 13.2  11.6 - 15.2 seconds   INR 1.01  0.00 - 1.49  POCT I-STAT TROPONIN I     Status: None   Collection Time    11/18/12 12:09 AM      Result Value Range   Troponin i, poc 0.02  0.00 - 0.08 ng/mL   Comment 3            Comment: Due to the release kinetics of cTnI,     a negative result within the first hours     of the onset of symptoms does not rule out     myocardial infarction with certainty.     If myocardial infarction is still suspected,     repeat the test at appropriate intervals.  POCT I-STAT, CHEM 8     Status: Abnormal   Collection Time    11/18/12 12:11 AM      Result Value Range   Sodium 140  135 - 145 mEq/L   Potassium 4.2  3.5 - 5.1 mEq/L   Chloride 103  96 - 112 mEq/L   BUN 48 (*) 6 - 23 mg/dL   Creatinine, Ser 4.09 (*) 0.50 - 1.10 mg/dL   Glucose, Bld 811 (*) 70 - 99 mg/dL   Calcium, Ion 9.14  7.82 - 1.30 mmol/L   TCO2  28  0 - 100 mmol/L   Hemoglobin 13.3  12.0 - 15.0 g/dL   HCT 95.6  21.3 - 08.6 %  TROPONIN I     Status: None   Collection Time    11/18/12  3:30 AM      Result Value Range   Troponin I <0.30  <0.30 ng/mL   Comment:            Due to the release kinetics of cTnI,     a negative result within the first hours     of the onset of symptoms does not rule out     myocardial infarction with certainty.     If myocardial infarction is still suspected,     repeat the test at appropriate intervals.  HEMOGLOBIN A1C     Status: Abnormal   Collection Time    11/18/12  3:31 AM      Result Value Range   Hemoglobin A1C 6.1 (*) <5.7 %   Comment: (NOTE)  According to the ADA Clinical Practice Recommendations for 2011, when     HbA1c is used as a screening test:      >=6.5%   Diagnostic of Diabetes Mellitus               (if abnormal result is confirmed)     5.7-6.4%   Increased risk of developing Diabetes Mellitus     References:Diagnosis and Classification of Diabetes Mellitus,Diabetes     Care,2011,34(Suppl 1):S62-S69 and Standards of Medical Care in             Diabetes - 2011,Diabetes Care,2011,34 (Suppl 1):S11-S61.   Mean Plasma Glucose 128 (*) <117 mg/dL  URINE RAPID DRUG SCREEN (HOSP PERFORMED)     Status: None   Collection Time    11/18/12  3:45 AM      Result Value Range   Opiates NONE DETECTED  NONE DETECTED   Cocaine NONE DETECTED  NONE DETECTED   Benzodiazepines NONE DETECTED  NONE DETECTED   Amphetamines NONE DETECTED  NONE DETECTED   Tetrahydrocannabinol NONE DETECTED  NONE DETECTED   Barbiturates NONE DETECTED  NONE DETECTED   Comment:            DRUG SCREEN FOR MEDICAL PURPOSES     ONLY.  IF CONFIRMATION IS NEEDED     FOR ANY PURPOSE, NOTIFY LAB     WITHIN 5 DAYS.                LOWEST DETECTABLE LIMITS     FOR URINE DRUG SCREEN     Drug Class       Cutoff (ng/mL)     Amphetamine      1000      Barbiturate      200     Benzodiazepine   200     Tricyclics       300     Opiates          300     Cocaine          300     THC              50  POCT PREGNANCY, URINE     Status: None   Collection Time    11/18/12  3:46 AM      Result Value Range   Preg Test, Ur NEGATIVE  NEGATIVE   Comment:            THE SENSITIVITY OF THIS     METHODOLOGY IS >24 mIU/mL  MRSA PCR SCREENING     Status: None   Collection Time    11/18/12  4:54 AM      Result Value Range   MRSA by PCR NEGATIVE  NEGATIVE   Comment:            The GeneXpert MRSA Assay (FDA     approved for NASAL specimens     only), is one component of a     comprehensive MRSA colonization     surveillance program. It is not     intended to diagnose MRSA     infection nor to guide or     monitor treatment for     MRSA infections.  GLUCOSE, CAPILLARY     Status: Abnormal   Collection Time    11/18/12  7:22 AM      Result Value Range   Glucose-Capillary 177 (*) 70 - 99 mg/dL  COMPREHENSIVE METABOLIC PANEL     Status:  Abnormal   Collection Time    11/18/12  9:04 AM      Result Value Range   Sodium 138  135 - 145 mEq/L   Potassium 4.5  3.5 - 5.1 mEq/L   Chloride 98  96 - 112 mEq/L   CO2 31  19 - 32 mEq/L   Glucose, Bld 183 (*) 70 - 99 mg/dL   BUN 38 (*) 6 - 23 mg/dL   Creatinine, Ser 1.19 (*) 0.50 - 1.10 mg/dL   Calcium 9.9  8.4 - 14.7 mg/dL   Total Protein 8.4 (*) 6.0 - 8.3 g/dL   Albumin 3.7  3.5 - 5.2 g/dL   AST 17  0 - 37 U/L   ALT 19  0 - 35 U/L   Alkaline Phosphatase 69  39 - 117 U/L   Total Bilirubin 0.2 (*) 0.3 - 1.2 mg/dL   GFR calc non Af Amer 31 (*) >90 mL/min   GFR calc Af Amer 36 (*) >90 mL/min   Comment:            The eGFR has been calculated     using the CKD EPI equation.     This calculation has not been     validated in all clinical     situations.     eGFR's persistently     <90 mL/min signify     possible Chronic Kidney Disease.  CBC WITH DIFFERENTIAL     Status: Abnormal   Collection  Time    11/18/12  9:04 AM      Result Value Range   WBC 8.7  4.0 - 10.5 K/uL   RBC 4.15  3.87 - 5.11 MIL/uL   Hemoglobin 12.2  12.0 - 15.0 g/dL   HCT 82.9  56.2 - 13.0 %   MCV 87.0  78.0 - 100.0 fL   MCH 29.4  26.0 - 34.0 pg   MCHC 33.8  30.0 - 36.0 g/dL   RDW 86.5  78.4 - 69.6 %   Platelets 368  150 - 400 K/uL   Neutrophils Relative 81 (*) 43 - 77 %   Neutro Abs 7.0  1.7 - 7.7 K/uL   Lymphocytes Relative 14  12 - 46 %   Lymphs Abs 1.3  0.7 - 4.0 K/uL   Monocytes Relative 5  3 - 12 %   Monocytes Absolute 0.4  0.1 - 1.0 K/uL   Eosinophils Relative 0  0 - 5 %   Eosinophils Absolute 0.0  0.0 - 0.7 K/uL   Basophils Relative 0  0 - 1 %   Basophils Absolute 0.0  0.0 - 0.1 K/uL  GLUCOSE, CAPILLARY     Status: Abnormal   Collection Time    11/18/12 12:26 PM      Result Value Range   Glucose-Capillary 163 (*) 70 - 99 mg/dL   Dg Chest 2 View  2/95/2841  *RADIOLOGY REPORT*  Clinical Data: Hypertension.  Lethargy.  CHEST - 2 VIEW  Comparison: Two-view chest 05/20/2007.  Findings: Mild cardiac enlargement is noted.  Pulmonary vascular congestion is present.  No focal airspace consolidation is evident. Mild exaggerated thoracic kyphosis is present.  IMPRESSION:  1.  Mild cardiomegaly and pulmonary vascular congestion suggesting early congestive heart failure. 2.  No significant airspace consolidation.   Original Report Authenticated By: Marin Roberts, M.D.    Ct Head Wo Contrast  11/18/2012  *RADIOLOGY REPORT*  Clinical Data: Hypertension; severe headache.  CT HEAD WITHOUT CONTRAST  Technique:  Contiguous axial images were obtained from the base of the skull through the vertex without contrast.  Comparison: None.  Findings: There is no evidence of acute infarction, mass lesion, or intra- or extra-axial hemorrhage on CT.  Scattered periventricular and subcortical white matter change likely reflects small vessel ischemic microangiopathy.  Small chronic lacunar infarcts are noted at the thalami  bilaterally. There is prominent calcification along the anterior falx cerebri.  The posterior fossa, including the cerebellum, brainstem and fourth ventricle, is within normal limits.  The third and lateral ventricles, and basal ganglia are unremarkable in appearance.  The cerebral hemispheres demonstrate grossly normal gray-white differentiation.  No mass effect or midline shift is seen.  There is no evidence of fracture; visualized osseous structures are unremarkable in appearance.  The visualized portions of the orbits are within normal limits.  The paranasal sinuses and mastoid air cells are well-aerated.  No significant soft tissue abnormalities are seen.  IMPRESSION:  1.  No acute intracranial pathology seen on CT. 2.  Scattered small vessel ischemic microangiopathy. 3.  Small chronic lacunar infarcts at the thalami bilaterally.   Original Report Authenticated By: Tonia Ghent, M.D.    Mr Brain Wo Contrast  11/18/2012  *RADIOLOGY REPORT*  Clinical Data: 67 year old female with right side numbness and weakness.  Aphasia.  Possible visual field cut.  Extremely high blood pressure.  MRI HEAD WITHOUT CONTRAST  Technique:  Multiplanar, multiecho pulse sequences of the brain and surrounding structures were obtained according to standard protocol without intravenous contrast.  Comparison: Head CT without contrast 11/17/2012.  Findings: The examination had to be discontinued prior to completion due to patient anxiety.  Axial diffusion, axial T2-weighted imaging, and sagittal T1- weighted imaging was acquired.  Confluent restricted diffusion throughout much of the left PCA territory.  Extensive left occipital lobe involvement.  Extension to the left mesial temporal lobe.  Patchy involvement of the inferior left parietal lobe. Small focus of involvement at the left dorsal lateral thalamus.  At the same time there are small areas of patchy mostly cortical restricted diffusion in the right occipital pole, right PCA  territory.  No posterior fossa restricted diffusion.  No other supratentorial restricted diffusion.  Affected areas show T2 hyperintensity in a pattern compatible with cytotoxic edema.  Major intracranial vascular flow voids are grossly preserved.  No ventriculomegaly.  Superimposed periventricular white matter T2 and FLAIR hyperintensity plus chronic lacunar infarcts in the bilateral thalami, pons, and left corona radiata out a.  Probable chronic micro hemorrhage in the right pons. There is probably there are probably also one or more chronic micro hemorrhages in the right cerebellar hemisphere.  Grossly normal pituitary, cervicomedullary junction and visualized cervical spine.  Hyperostosis of the calvarium. Visualized orbit soft tissues are within normal limits.  Visualized paranasal sinuses and mastoids are clear.  Negative visible scalp soft tissues.  IMPRESSION: 1.  Large acute left PCA infarct.  No significant mass effect at this time. 2.  Small acute right PCA cortical infarct involving the occipital pole. 3.  Underlying advanced chronic small vessel ischemia. 4.  The examination had to be discontinued prior to completion.   Original Report Authenticated By: Erskine Speed, M.D.     Assessment and plan discussed with with attending physician and they are in agreement.    Felicie Morn PA-C [facility] [phone]  11/18/2012, 1:38 PM  Patient seen and examined.  Clinical course and management discussed.  Necessary edits performed.  I agree with the above.  Assessment  and plan of care developed and discussed below.    Assessment: 67 y.o. female presenting after developing headache, increased BP and confusion.  Has seemed to worsen while hospitalized.  MRI today shows bilateral occipital infarcts.  Concerned about the posterior circulation.  Patient not considered a tPA candidate since LSW time was yesterday.    Stroke Risk Factors - atrial fibrillation, diabetes mellitus and  hypertension  Plan: 1. HgbA1c, fasting lipid panel 2. CTA of the brain.  Patient to have a low contrast study due to decreased renal function.  Will view posterior circulation and need for further intervention.   3. PT consult, OT consult, Speech consult 4. Echocardiogram 5. Carotid dopplers 6. Prophylactic therapy-Antiplatelet med: Aspirin - dose 325mg  daily 7. Risk factor modification 8. Telemetry monitoring 9. Frequent neuro checks  [name], MD [facility] [phone]  11/18/2012  5:09 PM

## 2012-11-18 NOTE — Progress Notes (Signed)
  Echocardiogram 2D Echocardiogram has been performed.  [name] 11/18/2012, 10:21 AM 

## 2012-11-18 NOTE — Care Management Note (Signed)
    Page 1 of 1   11/18/2012     8:17:09 AM   CARE MANAGEMENT NOTE 11/18/2012  Patient:  [name], [name]   Account Number:  [id number]  Date Initiated:  11/18/2012  Documentation initiated by:  Junius Creamer  Subjective/Objective Assessment:   adm w at fib     Action/Plan:   lives alone, pcp dr Consuella Lose griffin   Anticipated DC Date:     Anticipated DC Plan:        DC Planning Services  CM consult      Choice offered to / List presented to:             Status of service:   Medicare Important Message given?   (If response is "NO", the following Medicare IM given date fields will be blank) Date Medicare IM given:   Date Additional Medicare IM given:    Discharge Disposition:    Per UR Regulation:  Reviewed for med. necessity/level of care/duration of stay  If discussed at Long Length of Stay Meetings, dates discussed:    Comments:  4/10 0816 debbie Kathlynn Swofford rn,bsn

## 2012-11-18 NOTE — ED Notes (Addendum)
You may contact the following if needed:   [facility] [place](Daughter) [phone] [facility] [name](Sister) [phone] [facility] [place](Sister) [phone]

## 2012-11-18 NOTE — Progress Notes (Signed)
[facility] Progress Note [place] TEAM 1 - Stepdown/ICU TEAM   Shawny Borkowski ZOX:[id number] DOB: April 21, 1946 DOA: 11/17/2012 PCP: Astrid Divine, MD  Brief narrative: 67 y.o. female with history of diabetes mellitus type 2 and hypertension started experiencing headache last evening around 9 PM. It was mostly frontal and severe. EMS was called and patient was brought to the ER. Patient's blood pressure was found to be very high. In addition patient was found to be tachycardic and EKG showed patient is in A. fib with RVR which was new for her. CT of the head was negative for anything acute. Patient was nonfocal and denied any blurred vision, difficulty speaking or swallowing or any weakness of the upper lower extremities. Patient was found to be mildly confused initially. At time of admission the patient had been started on Cardizem infusion and has been admitted for further management. Patient denied any chest pain, shortness of breath, nausea/ vomiting or abdominal pain. Patient stated her blood pressure has not been well controlled recently and states that she has been compliant with her medications.   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 & Small acute right PCA cortical infarct involving the occipital -appreciate Neurology assistance -needs CTA head to clarify anatomy/any thrombus -NPO until swallow eval completed -MRI showed b/l PCA territory CVA    New Onset Atrial fibrillation with RVR -rate better controlled so since needs Cardene gtt for BP control will dc Cardizem gtt and use prn IV Lopressor for HR > 120    CKD (chronic kidney disease) stage 3, GFR 30-59 ml/min -Has progressed since 2008 but also looks a little pre renal -since she needs IV contrast (will use low dose Omnipaque)  will give bicarb infusion @ 75/hr -check BMET in am -suspect use of Metformin, diabetic nephropathy and poorly controlled HTN contributing    Diabetes mellitus, controlled -HgbA1c 6.1-given her age and renal function this is an appropriate reading- too tight control in this pt population can lead to recurrent hypoglycemia -would PERMANENTLY discontinue Metformin and search for alternative oral agent    Headache - 2/2 high BP and new CVA- better per pt report   DVT prophylaxis: SCDs Code Status: Full Family Communication: Patient and sister Disposition Plan: Currently in step down but we'll need to transfer to ICU given initiation of Cardene infusion Isolation: None  Consultants: Neurology  Procedures: 2-D echocardiogram pending CT angios of the head pending  Antibiotics: None  HPI/Subjective: Patient alert and having less expressive aphasia. No apparent extremity focal deficits appreciated. Becomes easily agitated as evidenced by tachycardia and hypertension when stimulated especially when family in room. Seems mostly to become frustrated with word finding and attempting to communicate. I was called later while pt was undergoing MRI to be notified that she is now having visual changes and numbness of right arm. MRI reveals evolving posterior CVA.   Objective: Blood pressure 161/83, pulse 78, temperature 98.5 F (36.9 C), temperature source Oral, resp. rate 23, height 5' (1.524 m), weight 110.6 kg (243 lb 13.3 oz), SpO2 100.00%.  Intake/Output Summary (Last 24 hours) at 11/18/12 1506 Last data filed at 11/18/12 1049  Gross per 24 hour  Intake 234.92 ml  Output    735 ml  Net -500.08 ml     Exam: Follow up exam completed. Patient admitted at 3:19 AM this morning  Data Reviewed: Basic Metabolic Panel:  Recent Labs Lab 11/18/12 0011 11/18/12 0904  NA 140 138  K 4.2 4.5  CL 103 98  CO2  --  31  GLUCOSE 148* 183*  BUN 48* 38*  CREATININE 1.70* 1.69*  CALCIUM   --  9.9   Liver Function Tests:  Recent Labs Lab 11/18/12 0904  AST 17  ALT 19  ALKPHOS 69  BILITOT 0.2*  PROT 8.4*  ALBUMIN 3.7   No results found for this basename: LIPASE, AMYLASE,  in the last 168 hours No results found for this basename: AMMONIA,  in the last 168 hours CBC:  Recent Labs Lab 11/17/12 2347 11/18/12 0011 11/18/12 0904  WBC 9.9  --  8.7  NEUTROABS 6.5  --  7.0  HGB 12.2 13.3 12.2  HCT 36.4 39.0 36.1  MCV 87.1  --  87.0  PLT 348  --  368   Cardiac Enzymes:  Recent Labs Lab 11/18/12 0330  TROPONINI <0.30   BNP (last 3 results) No results found for this basename: PROBNP,  in the last 8760 hours CBG:  Recent Labs Lab 11/18/12 0722 11/18/12 1226  GLUCAP 177* 163*    Recent Results (from the past 240 hour(s))  MRSA PCR SCREENING     Status: None   Collection Time    11/18/12  4:54 AM      Result Value Range Status   MRSA by PCR NEGATIVE  NEGATIVE Final   Comment:            The GeneXpert MRSA Assay (FDA     approved for NASAL specimens     only), is one component of a     comprehensive MRSA colonization     surveillance program. It is not     intended to diagnose MRSA     infection nor to guide or     monitor treatment for     MRSA infections.     Studies:  Recent x-ray studies have been reviewed in detail by the Attending Physician  Scheduled Meds:  Reviewed in detail by the Attending Physician   Junious Silk, ANP [facility] Office  [phone] Pager [phone]  On-Call/Text Page:      [email or web address]      password [email or web address]  If 7PM-7AM, please contact night-coverage [email or web address] Password [facility] 11/18/2012, 3:06 PM   LOS: 1 day   I have examined the patient, reviewed the chart and modified the above note which I agree with.   Dorraine Ellender,MD [phone] 11/18/2012, 4:45 PM

## 2012-11-18 NOTE — H&P (Addendum)
[facility] History and Physical  [name] ZOX:[id number] DOB: [birth year] DOA: 11/17/2012  Referring physician: ER physician. PCP: Heather Divine, MD  Specialists: None.  Chief Complaint: Headache.  HPI: [name] is a 67 y.o. female with history of diabetes mellitus type 2 and hypertension started experiencing headache last evening around 9 PM. It was mostly frontal and severe. EMS was called and patient was brought to the ER. Patient blood pressure was found to be very high in addition patient was found to be tachycardic and EKG showed patient is in A. fib with RVR. CT of the head was negative for anything acute. Patient was nonfocal denied any blurred vision difficulty speaking or swallowing or any weakness of the upper lower extremities. Patient was found to be mildly confused initially. At this time patient has been started on Cardizem infusion and has been admitted for further management. Patient denies any chest pain shortness of breath nausea vomiting abdominal pain. Patient states her blood pressure has not been well controlled recently and states that she has been compliant with her medications.  Review of Systems: As presented in the history of presenting illness, rest negative.  Past Medical History  Diagnosis Date  . Hypertension   . Diabetes mellitus   . Glaucoma   . Cataract    History reviewed. No pertinent past surgical history. Social History:  reports that she has never smoked. She does not have any smokeless tobacco history on file. She reports that she does not drink alcohol or use illicit drugs. Lives at home. where does patient live-- Can do ADLs. Can patient participate in ADLs?  No Known Allergies  Family History  Problem Relation Age of Onset  . Stroke Mother   . Diabetes type II Mother   . CAD Father       Prior to Admission medications   Medication Sig Start Date End Date Taking? Authorizing Provider  amLODipine-benazepril (LOTREL)  10-20 MG per capsule Take 1 capsule by mouth daily.   Yes Historical Provider, MD  hydrochlorothiazide (HYDRODIURIL) 25 MG tablet Take 25 mg by mouth daily.   Yes Historical Provider, MD  metFORMIN (GLUCOPHAGE) 500 MG tablet Take 500 mg by mouth 2 (two) times daily with a meal.   Yes Historical Provider, MD  metoprolol succinate (TOPROL-XL) 50 MG 24 hr tablet Take 50 mg by mouth daily. Take with or immediately following a meal.   Yes Historical Provider, MD   Physical Exam: Filed Vitals:   11/18/12 0215 11/18/12 0230 11/18/12 0245 11/18/12 0300  BP: 178/93 172/97 156/89 161/77  Pulse: 66     Temp:      TempSrc:      Resp: 15 14 20 20   SpO2: 96%        General:  Well-developed and nourished.  Eyes: Anicteric no pallor.  ENT: No facial asymmetry. No discharge from ears eyes nose or mouth.  Neck: No mass felt.  Cardiovascular: S1-S2 heard tachycardic and irregular.  Respiratory: No rhonchi or crepitations.  Abdomen: Soft nontender bowel sounds present.  Skin: No rash.  Musculoskeletal: No edema.  Psychiatric: Appears normal.  Neurologic: Presently alert awake oriented to time place and person. Moves all extremities.  Labs on Admission:  Basic Metabolic Panel:  Recent Labs Lab 11/18/12 0011  NA 140  K 4.2  CL 103  GLUCOSE 148*  BUN 48*  CREATININE 1.70*   Liver Function Tests: No results found for this basename: AST, ALT, ALKPHOS, BILITOT, PROT, ALBUMIN,  in the last  168 hours No results found for this basename: LIPASE, AMYLASE,  in the last 168 hours No results found for this basename: AMMONIA,  in the last 168 hours CBC:  Recent Labs Lab 11/17/12 2347 11/18/12 0011  WBC 9.9  --   NEUTROABS 6.5  --   HGB 12.2 13.3  HCT 36.4 39.0  MCV 87.1  --   PLT 348  --    Cardiac Enzymes: No results found for this basename: CKTOTAL, CKMB, CKMBINDEX, TROPONINI,  in the last 168 hours  BNP (last 3 results) No results found for this basename: PROBNP,  in the last  8760 hours CBG: No results found for this basename: GLUCAP,  in the last 168 hours  Radiological Exams on Admission: Dg Chest 2 View  11/18/2012  *RADIOLOGY REPORT*  Clinical Data: Hypertension.  Lethargy.  CHEST - 2 VIEW  Comparison: Two-view chest 05/20/2007.  Findings: Mild cardiac enlargement is noted.  Pulmonary vascular congestion is present.  No focal airspace consolidation is evident. Mild exaggerated thoracic kyphosis is present.  IMPRESSION:  1.  Mild cardiomegaly and pulmonary vascular congestion suggesting early congestive heart failure. 2.  No significant airspace consolidation.   Original Report Authenticated By: Marin Roberts, M.D.    Ct Head Wo Contrast  11/18/2012  *RADIOLOGY REPORT*  Clinical Data: Hypertension; severe headache.  CT HEAD WITHOUT CONTRAST  Technique:  Contiguous axial images were obtained from the base of the skull through the vertex without contrast.  Comparison: None.  Findings: There is no evidence of acute infarction, mass lesion, or intra- or extra-axial hemorrhage on CT.  Scattered periventricular and subcortical white matter change likely reflects small vessel ischemic microangiopathy.  Small chronic lacunar infarcts are noted at the thalami bilaterally. There is prominent calcification along the anterior falx cerebri.  The posterior fossa, including the cerebellum, brainstem and fourth ventricle, is within normal limits.  The third and lateral ventricles, and basal ganglia are unremarkable in appearance.  The cerebral hemispheres demonstrate grossly normal gray-white differentiation.  No mass effect or midline shift is seen.  There is no evidence of fracture; visualized osseous structures are unremarkable in appearance.  The visualized portions of the orbits are within normal limits.  The paranasal sinuses and mastoid air cells are well-aerated.  No significant soft tissue abnormalities are seen.  IMPRESSION:  1.  No acute intracranial pathology seen on CT. 2.   Scattered small vessel ischemic microangiopathy. 3.  Small chronic lacunar infarcts at the thalami bilaterally.   Original Report Authenticated By: Tonia Ghent, M.D.     EKG: Independently reviewed. Atrial fibrillation with RVR.  Assessment/Plan Principal Problem:   Hypertension, accelerated Active Problems:   Atrial fibrillation with RVR   CKD (chronic kidney disease)   Diabetes mellitus   1. Atrial fibrillation with RVR - patient has been started on Cardizem infusion. I have increased patient's Toprol dose from 50 mg XL daily to 50 mg by mouth twice a day with one dose now. Slowly taper off Cardizem infusion once patient takes over Toprol. Check thyroid function tests and 2-D echo. Cardiac markers have been negative and patient denies chest pain. If MRI brain is negative for anything acute patient may need to be placed on oral anticoagulants given A. fib with history of diabetes and hypertension. 2. Accelerated hypertension with possible hypertensive encephalopathy - presently patient is on Cardizem infusion and also her metoprolol dose has been increased along with a home dose of other medications have been continued. Patient has also received  one dose of Lasix in the ER. Closely follow blood pressure trends. Check MRI brain for anything acute including PRES. 3. Headache - probably from #2 reason. Check MRI brain. Patient has been placed on tramadol when necessary. 4. Diabetes mellitus type 2 - check hemoglobin A1c. Patient has been placed on sliding-scale coverage. 5. Kidney disease probably chronic - closely follow intake output and metabolic panel. UA is pending.    Code Status: Full code.  Family Communication: Patient's sister at bedside.  Disposition Plan: Admit to inpatient.    Naziah Portee N. [facility] Pager [phone].  If 7PM-7AM, please contact night-coverage [email or web address] Password [facility] 11/18/2012, 3:19 AM

## 2012-11-18 NOTE — ED Notes (Signed)
C/o left sided HA

## 2012-11-19 DIAGNOSIS — I4891 Unspecified atrial fibrillation: Secondary | ICD-10-CM

## 2012-11-19 DIAGNOSIS — I10 Essential (primary) hypertension: Secondary | ICD-10-CM

## 2012-11-19 LAB — BASIC METABOLIC PANEL
BUN: 30 mg/dL — ABNORMAL HIGH (ref 6–23)
Chloride: 98 mEq/L (ref 96–112)
GFR calc Af Amer: 39 mL/min — ABNORMAL LOW (ref >90)
GFR calc non Af Amer: 34 mL/min — ABNORMAL LOW (ref >90)
Potassium: 3.3 mEq/L — ABNORMAL LOW (ref 3.5–5.1)
Sodium: 140 mEq/L (ref 135–145)

## 2012-11-19 LAB — GLUCOSE, CAPILLARY: Glucose-Capillary: 126 mg/dL — ABNORMAL HIGH (ref 70–99)

## 2012-11-19 LAB — LIPID PANEL
Cholesterol: 122 mg/dL (ref 0–200)
Triglycerides: 48 mg/dL (ref <150)

## 2012-11-19 MED ORDER — ASPIRIN 325 MG PO TABS
325.0000 mg | ORAL_TABLET | Freq: Every day | ORAL | Status: DC
  Administered 2012-11-19 – 2012-11-20 (×2): 325 mg via ORAL
  Filled 2012-11-19 (×2): qty 1

## 2012-11-19 MED ORDER — ASPIRIN 300 MG RE SUPP
300.0000 mg | Freq: Every day | RECTAL | Status: DC
  Filled 2012-11-19 (×2): qty 1

## 2012-11-19 MED ORDER — RIVAROXABAN 15 MG PO TABS
15.0000 mg | ORAL_TABLET | Freq: Every day | ORAL | Status: DC
  Administered 2012-11-19 – 2012-11-23 (×5): 15 mg via ORAL
  Filled 2012-11-19 (×6): qty 1

## 2012-11-19 MED ORDER — DILTIAZEM HCL 30 MG PO TABS
30.0000 mg | ORAL_TABLET | Freq: Four times a day (QID) | ORAL | Status: DC
  Filled 2012-11-19 (×3): qty 1

## 2012-11-19 MED ORDER — METOPROLOL TARTRATE 25 MG PO TABS
25.0000 mg | ORAL_TABLET | Freq: Two times a day (BID) | ORAL | Status: DC
  Administered 2012-11-19 – 2012-11-20 (×3): 25 mg via ORAL
  Filled 2012-11-19 (×4): qty 1

## 2012-11-19 MED ORDER — DILTIAZEM HCL 60 MG PO TABS
60.0000 mg | ORAL_TABLET | Freq: Four times a day (QID) | ORAL | Status: DC
  Administered 2012-11-19 – 2012-11-21 (×8): 60 mg via ORAL
  Filled 2012-11-19 (×12): qty 1

## 2012-11-19 MED ORDER — RIVAROXABAN 20 MG PO TABS
20.0000 mg | ORAL_TABLET | Freq: Every day | ORAL | Status: DC
  Filled 2012-11-19: qty 1

## 2012-11-19 NOTE — Progress Notes (Signed)
Stroke Team Progress Note  HISTORY [name] is an 67 y.o. female who was brought into the hospital intialy due to experiencing headache last evening. It was also noted she had elevated BP by family. EMS was called and patient was brought to the ER. Patient blood pressure was found to be 172/127 and patient was found to be tachycardic and in A. fib with RVR. CT of the head was negative for acute infarct. At this time patient was started on Cardizem infusion and admitted. Due to increased complaints of vision neurology was called. Patient was sent to MRI and findings large acute left PCA infarct and small acute right PCA cortical infarct involving the occipital pole. Patient was not a TPA candidate secondary to delay in arrival.   SUBJECTIVE Her RN is at the bedside.  Overall she feels her condition is gradually worsening since admission.  OBJECTIVE Most recent Vital Signs: Filed Vitals:   11/19/12 0611 11/19/12 0630 11/19/12 0700 11/19/12 0800  BP: 141/80 124/84 133/78 146/91  Pulse: 102 111 123 97  Temp:    98.8 F (37.1 C)  TempSrc:    Oral  Resp: 25 17 24 18   Height:      Weight:      SpO2: 99% 100% 99% 100%   CBG (last 3)   Recent Labs  11/18/12 1721 11/18/12 2136 11/19/12 0834  GLUCAP 98 122* 127*   IV Fluid Intake:   . sodium chloride 10 mL/hr at 11/19/12 0700  . niCARdipine in saline Stopped (11/19/12 0700)  .  sodium bicarbonate 150 mEq in sterile water 1000 mL infusion 75 mL/hr at 11/19/12 0700    MEDICATIONS  . insulin aspart  0-9 Units Subcutaneous TID WC  . sodium chloride  3 mL Intravenous Q12H  . sodium chloride  3 mL Intravenous Q12H   PRN:  acetaminophen, acetaminophen, hydrALAZINE, metoprolol, ondansetron (ZOFRAN) IV, ondansetron  Diet:  NPO  Activity:    Up with assistance DVT Prophylaxis:  SCDs   CLINICALLY SIGNIFICANT STUDIES Basic Metabolic Panel:  Recent Labs Lab 11/18/12 2134 11/19/12 0535  NA 140 140  K 3.4* 3.3*  CL 100 98  CO2 30 32   GLUCOSE 130* 142*  BUN 31* 30*  CREATININE 1.48* 1.57*  CALCIUM 9.8 9.3   Liver Function Tests:  Recent Labs Lab 11/18/12 0904  AST 17  ALT 19  ALKPHOS 69  BILITOT 0.2*  PROT 8.4*  ALBUMIN 3.7   CBC:  Recent Labs Lab 11/17/12 2347 11/18/12 0011 11/18/12 0904  WBC 9.9  --  8.7  NEUTROABS 6.5  --  7.0  HGB 12.2 13.3 12.2  HCT 36.4 39.0 36.1  MCV 87.1  --  87.0  PLT 348  --  368   Coagulation:  Recent Labs Lab 11/17/12 2347  LABPROT 13.2  INR 1.01   Cardiac Enzymes:  Recent Labs Lab 11/18/12 0330  TROPONINI <0.30   Urinalysis: No results found for this basename: COLORURINE, APPERANCEUR, LABSPEC, PHURINE, GLUCOSEU, HGBUR, BILIRUBINUR, KETONESUR, PROTEINUR, UROBILINOGEN, NITRITE, LEUKOCYTESUR,  in the last 168 hours Lipid Panel No results found for this basename: chol, trig, hdl, cholhdl, vldl, ldlcalc   HgbA1C  Lab Results  Component Value Date   HGBA1C 6.1* 11/18/2012   Urine Drug Screen:     Component Value Date/Time   LABOPIA NONE DETECTED 11/18/2012 0345   COCAINSCRNUR NONE DETECTED 11/18/2012 0345   LABBENZ NONE DETECTED 11/18/2012 0345   AMPHETMU NONE DETECTED 11/18/2012 0345   THCU NONE DETECTED 11/18/2012  0345   LABBARB NONE DETECTED 11/18/2012 0345    Alcohol Level: No results found for this basename: ETH,  in the last 168 hours  CT of the brain  11/18/2012   1.  No acute intracranial pathology seen on CT. 2.  Scattered small vessel ischemic microangiopathy. 3.  Small chronic lacunar infarcts at the thalami bilaterally.    CT Angio of the Head 11/18/2012   1.  Proximal left posterior cerebral artery occlusion. 2.  Large left PCA territory infarct. 3.  Mild distal MCA small vessel disease, left greater than right.   MRI of the brain  11/18/2012  1.  Large acute left PCA infarct.  No significant mass effect at this time. 2.  Small acute right PCA cortical infarct involving the occipital pole. 3.  Underlying advanced chronic small vessel ischemia. 4.  The  examination had to be discontinued prior to completion.    MRA of the brain  See CT angio head  2D Echocardiogram  EF 60-65% with no source of embolus.   Carotid Doppler    CXR  11/18/2012   1.  Mild cardiomegaly and pulmonary vascular congestion suggesting early congestive heart failure. 2.  No significant airspace consolidation.    EKG  atrial fibrillation, rate 169.   Therapy Recommendations   Physical Exam   Obese middle-aged [place] American lady currently not in distress.Awake alert. Afebrile. Head is nontraumatic. Neck is supple without bruit. Hearing is normal. Cardiac exam no murmur or gallop. Lungs are clear to auscultation. Distal pulses are well felt. Neurological exam ; awake alert moderately aphasic with nonfluent speech.speaks short sentences. Understands simple 1 step and midline commands only. Unable to name and repeat. Left gaze preference and unable to look past midline to the right. Does not blink to threat on the right but does so minimally on the left. Has only minimum left peripheral vision and is almost blind. Pupils irregular reactive. Mild right lower facial weakness. Tongue midline. Motor system exam shows mild right hemiparesis with right upper and lower extremity drift. Weakness of right grip and intrinsic hand muscles. Sensation and coordination difficult to interpret as patient not very cooperative. Right plantar upgoing left is downgoing. Gait was not tested.  ASSESSMENT [name] is a 67 y.o. female initially presenting with headache and confusion in the setting of new afib and accelerated hypertension, BP 170/127. She developed right hemiparesis and visual abnormalities after admission. Imaging confirms bilateral (left > right) PCA infarct. Infarcts felt to be embolic secondary to new onset atrial fibrillation. No on  antiplatelets prior to admission. Now on aspirin' for secondary stroke prevention. 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 Work up underway.  atrial fibrillation  Accelerated hypertension Diabetes, HgbA1c 6.1, at goal < 7.0 Headache Morbid obesity, Body mass index is 46.8 kg/(m^2). Kidney disease, likely chronic, Cr 1.57 Family history of stroke  Hospital day # 2  TREATMENT/PLAN  Continue aspirin for secondary stroke prevention while NPO. Once able to swallow or has permanent feeding tube placed, recommend anticoagulation for secondary stroke prevention, prefer NOAC where bridging not required.  SBP goal < 180, may be elevated in response to stroke and will spontaneously decrease. Can consider more aggressive treatment by time of discharge  Check swallow  OOB. Therapy evals - PT, OT and ST  Check lipids  F/u Carotid Doppler  Dr. Pearlean Brownie discussed diagnosis, prognosis and plan of care  with patient, RN and daughter Burnetta Sabin via telephone and with Dr Butler Denmark.  Annie Main, MSN, RN, ANVP-BC, ANP-BC, [facility] Pager: [phone] 11/19/2012 9:58 AM  I have personally obtained a history, examined the patient, evaluated imaging results, and formulated the assessment and plan of care. I agree with the above. Delia Heady, MD

## 2012-11-19 NOTE — Evaluation (Signed)
Speech Language Pathology Evaluation Patient Details Name: [name] MRN: [id number] DOB: 01/05/1946 Today's Date: 11/19/2012 Time: 1130-1200 SLP Time Calculation (min): 30 min  Problem List:  Patient Active Problem List  Diagnosis  . Hypertension, accelerated  . New Onset Atrial fibrillation with RVR  . CKD (chronic kidney disease) stage 3, GFR 30-59 ml/min  . Diabetes mellitus, controlled  . Headache  . 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 Hypertension   . Diabetes mellitus   . Glaucoma   . Cataract    Past Surgical History: History reviewed. No pertinent past surgical history. HPI: 67 y.o. female with hx DM presenting after developing headache, increased BP and confusion. MRI revealed acute cerebral infarction/Large acute left PCA infarct & Small acute right PCA cortical infarct involving the occipital lobe.  Assessment / Plan / Recommendation Clinical Impression  Pt presents with a posterior aphasia, characterized by fluent speech with presence of anomia, semantic/phonemic paraphasias, impaired ability to follow commands and answer basic questions accurately.  Visual deficits heavily impacting function as well.  Demonstrates emerging recognition of errors with attempts to self-correct.  Sister present for evaluation and educated re: the nature, impact, and recovery process with aphasia. Rec SLP f/u to address basic communication needs and education.       SLP Assessment  Patient needs continued Speech Lanaguage Pathology Services    Follow Up Recommendations  Inpatient Rehab    Frequency and Duration min 3x week  2 weeks   Pertinent Vitals/Pain No pain   SLP Goals  SLP Goals Potential to Achieve Goals: Good SLP Goal #1: Pt will answer basic biographical questions with 80% accuracy and min assist. SLP Goal #2: Pt will  follow functional one-step commands with 75% accuracy and mod assist. SLP Goal #3: Pt will name functional objects in room with multimodal cues and 70% accuracy.  SLP Evaluation Prior Functioning  Cognitive/Linguistic Baseline: Within functional limits Type of Home: House Lives With: Daughter Available Help at Discharge: Other (Comment) (daughter works; siblings in town)   Cognition  Overall Cognitive Status: Other (comment) (to be assessed as language allows) Arousal/Alertness: Awake/alert    Comprehension  Auditory Comprehension Overall Auditory Comprehension: Impaired Yes/No Questions: Impaired Basic Biographical Questions: 51-75% accurate Commands: Impaired One Step Basic Commands: 25-49% accurate Conversation: Simple Visual Recognition/Discrimination Discrimination: Exceptions to [facility] Common Objects: Able in field of 2 (with mod tactile/verbal cues) Reading Comprehension Reading Status: Not tested    Expression Expression Primary Mode of Expression: Verbal Verbal Expression Overall Verbal Expression: Impaired Initiation: No impairment Automatic Speech: Counting;Day of week Level of Generative/Spontaneous Verbalization: Phrase Repetition: Impaired Level of Impairment: Phrase level Naming: Impairment Responsive: 0-25% accurate Confrontation: Impaired Common Objects: Able in field of 2 (with mod tactile/verbal cues) Convergent: 0-24% accurate Divergent: 0-24% accurate Verbal Errors: Semantic paraphasias;Phonemic paraphasias Pragmatics: No impairment Written Expression Dominant Hand: Right Written Expression: Not tested   Oral / Motor Oral Motor/Sensory Function Overall Oral Motor/Sensory Function: Other (comment) (asymmetry CN VII right) Motor Speech Overall Motor Speech: Appears within functional limits for tasks assessed   GO    [name] [name], [place] CCC/SLP Pager [phone]  [name] [name] 11/19/2012, 1:46 PM

## 2012-11-19 NOTE — Progress Notes (Signed)
[facility] Progress Note [place] TEAM 1 - Stepdown/ICU TEAM   Ashlye Oviedo WUJ:[id number] DOB: [birth year] DOA: 11/17/2012 PCP: Astrid Divine, MD  Brief narrative: 67 y.o. female with history of diabetes mellitus type 2 and hypertension started experiencing headache last evening around 9 PM. It was mostly frontal and severe. EMS was called and patient was brought to the ER. Patient's blood pressure was found to be very high. In addition patient was found to be tachycardic and EKG showed patient is in A. fib with RVR which was new for her. CT of the head was negative for anything acute. Patient was nonfocal and denied any blurred vision, difficulty speaking or swallowing or any weakness of the upper lower extremities. Patient was found to be mildly confused initially. At time of admission the patient had been started on Cardizem infusion and has been admitted for further management. Patient denied any chest pain, shortness of breath, nausea/ vomiting or abdominal pain. Patient stated her blood pressure has not been well controlled recently and states that she has been compliant with her medications.  First 24 hours after admission patient initially had an appearance consistent with PRES during initial examination on the morning rounds. Subsequently later in the day when the attending physician returned to re-evaluate the patient she noticed some subtle focal neurological deficits involving the right arm and orbital ptosis and diplopia. Subsequently a positive MRI report was called revealing bilateral PCA territory strokes greater on the left. The MRI also revealed left parietal/temporal and left occipital involvement as well. This was associated with cytotoxic edema. There were also areas of chronic micro-hemorrhagic changes in the right cerebellar hemisphere. Neurology was consulted.   On 11/19/2012 patient continued to have symptoms consistent with evolving stroke. She had marked upper  right extremity weakness although sensation remained intact. She was somewhat weaker on the left lower extremity than the right lower extremity. She had marked ptosis of the left eye with right facial drooping. She does have a prominent visual gaze deficit as evidenced by bilateral hemianopsia with left sided visual field preserved and intermittent bouts of diplopia.  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 Pearlean Brownie /Neurology who said we can start Xarelto once patient can swallow- will ask pharmacy to assist    New Onset Atrial fibrillation with RVR -Cont BB and add oral CCB    CKD (chronic kidney disease) stage 3, GFR 30-59 ml/min -Has progressed since 2008 but also looks a little pre renal -since she needs IV contrast (will use low dose Omnipaque) we gave bicarb infusion @ 75/hr x 24 -check BMET in am -suspect use of Metformin, diabetic nephropathy and poorly controlled HTN contributing    Diabetes mellitus, controlled -HgbA1c 6.1-given her age and renal function this is an appropriate reading- too tight control in this pt population can lead to recurrent hypoglycemia -would PERMANENTLY discontinue Metformin and search for alternative oral agent    Headache - 2/2  high BP and new CVA- better per pt report   DVT  prophylaxis: SCDs Code Status: Full Family Communication: Patient  Disposition Plan: Transfer to SDU Isolation: None  Consultants: Neurology  Procedures: 2-D echocardiogram (4/10) Study Conclusions  - Left ventricle: The cavity size was normal. There was mild concentric hypertrophy. Systolic function was normal. The estimated ejection fraction was in the range of 60% to 65%. Wall motion was normal; there were no regional wall motion abnormalities. - Mitral valve: Mild to moderate regurgitation. - Left atrium: The atrium was moderately to severely dilated. - Right atrium: The atrium was dilated. - Pulmonary arteries: Systolic pressure was moderately increased. PA peak pressure: 52mm Hg (S).  Carotid dopplers  pending  Antibiotics: None  HPI/Subjective: Patient alert and interactive; more verbal but still seems to have aphasia and mild dysarthria. Not as anxious and no complaints verbalized.   Objective: Blood pressure 146/91, pulse 97, temperature 98.8 F (37.1 C), temperature source Oral, resp. rate 18, height 5' (1.524 m), weight 108.7 kg (239 lb 10.2 oz), SpO2 100.00%.  Intake/Output Summary (Last 24 hours) at 11/19/12 1356 Last data filed at 11/19/12 0800  Gross per 24 hour  Intake 1683.49 ml  Output   2270 ml  Net -586.51 ml     Exam: General: No acute respiratory distress Lungs: Clear to auscultation bilaterally without wheezes or crackles, 2 L Cardiovascular: Irregular rate and rhythm without murmur gallop or rub normal S1 and S2, no peripheral edema or JVD Abdomen: Nontender, nondistended, soft, bowel sounds positive, no rebound, no ascites, no appreciable mass Musculoskeletal: No significant cyanosis, clubbing of bilateral lower extremities Neurological: Alert and oriented x 3, right upper extremity weak 1-2/5 w/ intact sensation and overall right side neglect; LLE somewhat weaker than RLE with strength 4/5; has marked left orbital ptosis and right facial  droop, mild dysarthria and improved mixed aphasia   Data Reviewed: Basic Metabolic Panel:  Recent Labs Lab 11/18/12 0011 11/18/12 0904 11/18/12 2134 11/19/12 0535  NA 140 138 140 140  K 4.2 4.5 3.4* 3.3*  CL 103 98 100 98  CO2  --  31 30 32  GLUCOSE 148* 183* 130* 142*  BUN 48* 38* 31* 30*  CREATININE 1.70* 1.69* 1.48* 1.57*  CALCIUM  --  9.9 9.8 9.3   Liver Function Tests:  Recent Labs Lab 11/18/12 0904  AST 17  ALT 19  ALKPHOS 69  BILITOT 0.2*  PROT 8.4*  ALBUMIN 3.7   No results found for this basename: LIPASE, AMYLASE,  in the last 168 hours No results found for this basename: AMMONIA,  in the last 168 hours CBC:  Recent Labs Lab 11/17/12 2347 11/18/12 0011 11/18/12 0904  WBC 9.9  --  8.7  NEUTROABS 6.5  --  7.0  HGB 12.2 13.3 12.2  HCT 36.4 39.0 36.1  MCV 87.1  --  87.0  PLT 348  --  368   Cardiac Enzymes:  Recent Labs Lab 11/18/12 0330  TROPONINI <0.30   BNP (last 3 results) No results found for this basename: PROBNP,  in the last 8760 hours CBG:  Recent Labs Lab 11/18/12 1226 11/18/12 1721 11/18/12 2136 11/19/12 0834 11/19/12 1317  GLUCAP 163* 98 122* 127* 129*    Recent Results (from the past 240 hour(s))  MRSA PCR SCREENING     Status: None   Collection Time    11/18/12  4:54 AM      Result Value Range Status   MRSA by PCR NEGATIVE  NEGATIVE Final   Comment:            The GeneXpert MRSA Assay (FDA     approved for NASAL specimens     only), is one component of a     comprehensive MRSA colonization     surveillance program. It is not     intended to diagnose MRSA     infection nor to guide or     monitor treatment for     MRSA infections.     Studies:  Recent x-ray studies have been reviewed in detail by the Attending Physician  Scheduled Meds:  Reviewed in detail by the Attending Physician   Junious Silk, ANP [facility] Office  [phone] Pager [phone]  On-Call/Text Page:      [email or web address]       password [email or web address]  If 7PM-7AM, please contact night-coverage [email or web address] Password [facility] 11/19/2012, 1:56 PM   LOS: 2 days   I have examined the patient, reviewed the chart and modified the above note which I agree with.   Amarea Macdowell,MD [phone] 11/19/2012, 6:14 PM

## 2012-11-19 NOTE — Evaluation (Signed)
Clinical/Bedside Swallow Evaluation Patient Details  Name: [name] MRN: [id number] Date of Birth: 12-10-1945  Today's Date: 11/19/2012 Time: 1100-1130 SLP Time Calculation (min): 30 min  Past Medical History:  Past Medical History  Diagnosis Date  . Hypertension   . Diabetes mellitus   . Glaucoma   . Cataract    Past Surgical History: History reviewed. No pertinent past surgical history. HPI:  67 y.o. female with hx DM presenting after developing headache, increased BP and confusion. MRI revealed acute cerebral infarction/Large acute left PCA infarct & Small acute right PCA cortical infarct involving the occipital lobe.      Assessment / Plan / Recommendation Clinical Impression  Pt presents with mild sensory deficits, but overall functional swallow with adequate bolus recognition, swift swallow trigger, and no indications of compromised airway protection.  Pt with significant visual deficits impacting ability to self-feed.  Recommend initiating a heart healthy diet with thin liquids; full supervision to assist with feeding.  Allow pt to feed herself with hand-over-hand assist when needed.      Aspiration Risk  Mild    Diet Recommendation Regular;Thin liquid   Liquid Administration via: Cup Medication Administration: Whole meds with liquid Supervision:  (assist as needed) Compensations: Slow rate;Small sips/bites Postural Changes and/or Swallow Maneuvers: Seated upright 90 degrees    Other  Recommendations Oral Care Recommendations: Oral care BID   Follow Up Recommendations  None             Swallow Study Prior Functional Status       General Date of Onset: 11/17/12 HPI: 67 y.o. female presenting after developing headache, increased BP and confusion.  Has seemed to worsen while hospitalized.  MRI today shows bilateral occipital infarcts.  Concerned about the posterior circulation.  Patient not considered a tPA candidate since LSW time was yesterday Type of  Study: Bedside swallow evaluation Diet Prior to this Study: NPO Temperature Spikes Noted: No Behavior/Cognition: Alert;Pleasant mood;Requires cueing Self-Feeding Abilities: Needs assist Patient Positioning: Upright in bed Baseline Vocal Quality: Clear Volitional Cough: Weak Volitional Swallow: Unable to elicit    Oral/Motor/Sensory Function Overall Oral Motor/Sensory Function: Other (comment) (asymmetry CN VII right)   Ice Chips Ice chips: Within functional limits   Thin Liquid Thin Liquid: Within functional limits    Nectar Thick Nectar Thick Liquid: Not tested   Honey Thick Honey Thick Liquid: Not tested   Puree Puree: Impaired Presentation: Spoon Oral Phase Functional Implications: Right anterior spillage   Solid  [name] L. [place], [place] CCC/SLP Pager [phone]     Solid: Within functional limits Presentation: Self Fed       [name] [name] 11/19/2012,1:25 PM

## 2012-11-19 NOTE — Progress Notes (Addendum)
ANTICOAGULATION CONSULT NOTE - Initial Consult  Pharmacy Consult for Xarelto Indication: atrial fibrillation  No Known Allergies  Patient Measurements: Height: 5' (152.4 cm) Weight: 239 lb 10.2 oz (108.7 kg) IBW/kg (Calculated) : 45.5 Heparin Dosing Weight: n/a  Vital Signs: Temp: 98.9 F (37.2 C) (04/11 1200) Temp src: Oral (04/11 1200) BP: 159/102 mmHg (04/11 1500) Pulse Rate: 118 (04/11 1000)  Labs:  Recent Labs  11/17/12 2347  11/18/12 0011 11/18/12 0330 11/18/12 0904 11/18/12 2134 11/19/12 0535  HGB 12.2  --  13.3  --  12.2  --   --   HCT 36.4  --  39.0  --  36.1  --   --   PLT 348  --   --   --  368  --   --   LABPROT 13.2  --   --   --   --   --   --   INR 1.01  --   --   --   --   --   --   CREATININE  --   < > 1.70*  --  1.69* 1.48* 1.57*  TROPONINI  --   --   --  <0.30  --   --   --   < > = values in this interval not displayed.  Estimated Creatinine Clearance: 40.5 ml/min (by C-G formula based on Cr of 1.57).   Medical History: Past Medical History  Diagnosis Date  . Hypertension   . Diabetes mellitus   . Glaucoma   . Cataract     Medications:  Scheduled:  . aspirin  300 mg Rectal Daily   Or  . aspirin  325 mg Oral Daily  . diltiazem  60 mg Oral Q6H  . insulin aspart  0-9 Units Subcutaneous TID WC  . metoprolol tartrate  25 mg Oral BID  . sodium chloride  3 mL Intravenous Q12H  . sodium chloride  3 mL Intravenous Q12H  . [DISCONTINUED] diltiazem  30 mg Oral Q6H    Assessment: 67 yo female with stroke felt to be embolic secondary to new onset atrial fibrillation.  Pharmacy asked to begin Xarelto for secondary stroke prevention.  Also on ASA.  Will need reduced Xarelto dosing due to CrCl < 50.  Goal of Therapy:  Stroke prevention Monitor platelets by anticoagulation protocol: Yes   Plan:  1. Xarelto 15 mg po daily. 2. Will begin Xarelto education. 3. Monitor for signs or symptoms of bleeding.  Tad Moore, BCPS   Clinical Pharmacist Pager [phone]  11/19/2012 3:43 PM

## 2012-11-19 NOTE — Evaluation (Signed)
Physical Therapy Evaluation Patient Details Name: [name] MRN: [id number] DOB: 10/19/1945 Today's Date: 11/19/2012 Time: 9629-5284 PT Time Calculation (min): 35 min  PT Assessment / Plan / Recommendation Clinical Impression  Patient is a 67 y/o female admitted with headache, hypertension,  A-fib w/RVR without focal deficits.  Deficits demonstrated after admission with new large left PCA infact and small right PCA infarct noted on MRI.  Pt presents with expressive and receptive aphasia, decreased independence with mobility, due to decreased sitting balance, decreased right UE/LE strength/sensation and awareness, poor activity tolerance with BP 192/158 and HR up to 133 with activity at edge of bed, decreased vision with left gaze preference and right hemianopsia.  Patient will benefit from skilled PT in the acute setting to maximize independence and hopefully allow return home with family assist after CIR stay.    PT Assessment  Patient needs continued PT services    Follow Up Recommendations  CIR;Supervision/Assistance - 24 hour    Does the patient have the potential to tolerate intense rehabilitation    yes  Barriers to Discharge  unsure home environment and 24 hour help      Equipment Recommendations  Wheelchair (measurements PT);Wheelchair cushion (measurements PT) (cane versus walker TBA)    Recommendations for Other Services  Rehab consult   Frequency Min 4X/week    Precautions / Restrictions Precautions Precautions: Fall   Pertinent Vitals/Pain No pain complaints      Mobility  Bed Mobility Bed Mobility: Supine to Sit;Sit to Supine;Sitting - Scoot to Edge of Bed Supine to Sit: HOB elevated;3: Mod assist;With rails Sitting - Scoot to [place] of Bed: 2: Max assist Sit to Supine: HOB flat;1: +2 Total assist Sit to Supine: Patient Percentage: 10% Details for Bed Mobility Assistance: needed help to get right leg off bed and to scoot right hip out; to supine pt unable to  assist much due to poor motor planning and confusion about how to lie down Transfers Transfers: Sit to Stand Sit to Stand: 1: +2 Total assist;From bed Sit to Stand: Patient Percentage: 30% Stand to Sit: 1: +2 Total assist;To bed Stand to Sit: Patient Percentage: 30% Details for Transfer Assistance: difficult from bed in lowest position to get right leg extended in standing and hips under trunk; assist to sit to get hips closer to head of bed.  Unable to take steps to move to head of bed due to poor weight acceptance right LE    Vision   Visual testing; pt tracks to left and able to track to midline, does not move eyes to right; turns head left to visualize how many fingers with eyes deiviated left.  Does not blink to threat   PT Diagnosis: Hemiplegia dominant side;Difficulty walking;Generalized weakness  PT Problem List: Decreased strength;Decreased balance;Decreased mobility;Decreased activity tolerance;Decreased cognition;Decreased knowledge of use of DME;Impaired sensation;Decreased safety awareness;Decreased range of motion PT Treatment Interventions: DME instruction;Gait training;Balance training;Neuromuscular re-education;Cognitive remediation;Functional mobility training;Patient/family education;Therapeutic activities;Therapeutic exercise;Wheelchair mobility training   PT Goals Acute Rehab PT Goals PT Goal Formulation: With patient Time For Goal Achievement: 12/03/12 Potential to Achieve Goals: Good Pt will Roll Supine to Right Side: with rail;with supervision PT Goal: Rolling Supine to Right Side - Progress: Goal set today Pt will Roll Supine to Left Side: with min assist;with rail PT Goal: Rolling Supine to Left Side - Progress: Goal set today Pt will go Supine/Side to Sit: with min assist PT Goal: Supine/Side to Sit - Progress: Goal set today Pt will Sit at [place] of  Bed: with modified independence;with no upper extremity support PT Goal: Sit at Edge Of Bed - Progress: Goal set  today Pt will go Sit to Supine/Side: with mod assist PT Goal: Sit to Supine/Side - Progress: Goal set today Pt will go Sit to Stand: with upper extremity assist;with mod assist PT Goal: Sit to Stand - Progress: Goal set today Pt will go Stand to Sit: with min assist;with upper extremity assist PT Goal: Stand to Sit - Progress: Goal set today Pt will Stand: with min assist;1 - 2 min;with unilateral upper extremity support PT Goal: Stand - Progress: Goal set today Pt will Ambulate: 16 - 50 feet;with least restrictive assistive device;with mod assist PT Goal: Ambulate - Progress: Goal set today  Visit Information  Last PT Received On: 11/19/12 Assistance Needed: +2    Subjective Data  Subjective: I need some time.  I can't do it with my left. Patient Stated Goal: To get better   Prior Functioning  Home Living Lives With: Daughter Available Help at Discharge: Available PRN/intermittently;Family (daughter works, but has other famly who may can help) Type of Home: House Home Layout: One level Prior Function Level of Independence: Independent;Independent with assistive device(s) Driving: Yes Comments: unsure due to pt wax/wane cognition and ability to communicat (question cane or walker for ambulation prior due to some hip pain) Communication Communication: Expressive difficulties;Receptive difficulties Dominant Hand: Right    Cognition  Cognition Overall Cognitive Status: Impaired Area of Impairment: Following commands;Attention Arousal/Alertness: Awake/alert Orientation Level: Other (comment) (knows name, not DOB, language makes difficult to assess) Behavior During Session: Agitated Current Attention Level: Sustained Attention - Other Comments: has difficulty focusing long due to frustration and fatigue as well as distracted by her own deficits Following Commands: Follows one step commands inconsistently Safety/Judgement - Other Comments: seems fearful to move due to vision  deficits and right inattention    Extremity/Trunk Assessment Right Upper Extremity Assessment RUE ROM/Strength/Tone: Deficits RUE ROM/Strength/Tone Deficits: lifts to about 80 deg shoulder flex, AAROM WFL, strength shoulder flex 3-/5, elbow flex 3+/4, ext 3-/5 RUE Sensation: Deficits RUE Sensation Deficits: diminished to light touch Left Upper Extremity Assessment LUE ROM/Strength/Tone: Deficits LUE ROM/Strength/Tone Deficits: AROM grossly WFL, strength shoulder flex 4-/5, elbow flex 4+/5 LUE Sensation: WFL - Light Touch Right Lower Extremity Assessment RLE ROM/Strength/Tone: Deficits RLE ROM/Strength/Tone Deficits: AAROM limited stiffness in hip, in supine w/ HOB 50 deg about 60 deg hip flex, 30 deg knee flex, ankle AROM WFL RLE Sensation: Deficits RLE Sensation Deficits: though states left feels diminished compared to right has decreased awareness with increased time to move leg or attempt to move with supine to sit and did not demonstrate much weight acceptance in standing. Left Lower Extremity Assessment LLE Sensation: Deficits LLE Sensation Deficits: states left leg feels decreased compared to right   Balance Balance Balance Assessed: Yes Static Sitting Balance Static Sitting - Balance Support: Left upper extremity supported;No upper extremity supported;Feet supported Static Sitting - Level of Assistance: 4: Min assist;5: Stand by assistance Static Sitting - Comment/# of Minutes: initially leaning left with left hand on rail or bed and needed cues to sit upright (leaning on bed with HOB elevated;) over time able to sit with left hand in lap; demonstrates right rotation and left pelvic obliquity with right trunk shortened Dynamic Sitting Balance Dynamic Sitting - Balance Support: Feet supported Dynamic Sitting - Level of Assistance: 5: Stand by assistance Dynamic Sitting - Balance Activities: Reaching for objects Dynamic Sitting - Comments: sitting reaching with  left UE assisted due  to difficulty understanding commands Static Standing Balance Static Standing - Balance Support: Bilateral upper extremity supported Static Standing - Level of Assistance: 1: +2 Total assist Static Standing - Comment/# of Minutes: stood about 30 seconds with pt=30%  End of Session PT - End of Session Activity Tolerance: Patient limited by fatigue;Treatment limited secondary to medical complications (Comment) (high BP, high HR) Patient left: with call bell/phone within reach;in bed  GP     [facility] 11/19/2012, 5:11 PM [place], PT [phone] 11/19/2012

## 2012-11-20 DIAGNOSIS — I6350 Cerebral infarction due to unspecified occlusion or stenosis of unspecified cerebral artery: Secondary | ICD-10-CM

## 2012-11-20 LAB — GLUCOSE, CAPILLARY
Glucose-Capillary: 128 mg/dL — ABNORMAL HIGH (ref 70–99)
Glucose-Capillary: 151 mg/dL — ABNORMAL HIGH (ref 70–99)

## 2012-11-20 LAB — BASIC METABOLIC PANEL
BUN: 28 mg/dL — ABNORMAL HIGH (ref 6–23)
Chloride: 95 mEq/L — ABNORMAL LOW (ref 96–112)
Glucose, Bld: 120 mg/dL — ABNORMAL HIGH (ref 70–99)
Potassium: 3.6 mEq/L (ref 3.5–5.1)
Sodium: 138 mEq/L (ref 135–145)

## 2012-11-20 MED ORDER — METOPROLOL TARTRATE 50 MG PO TABS
50.0000 mg | ORAL_TABLET | Freq: Two times a day (BID) | ORAL | Status: DC
  Administered 2012-11-20: 50 mg via ORAL
  Filled 2012-11-20 (×3): qty 1

## 2012-11-20 MED ORDER — ASPIRIN 81 MG PO CHEW
81.0000 mg | CHEWABLE_TABLET | Freq: Every day | ORAL | Status: DC
  Administered 2012-11-21 – 2012-11-24 (×4): 81 mg via ORAL
  Filled 2012-11-20 (×5): qty 1

## 2012-11-20 NOTE — Progress Notes (Signed)
Physical Therapy Treatment Patient Details Name: [name] MRN: [id number] DOB: [birth year] Today's Date: 11/20/2012 Time: 5621-3086 PT Time Calculation (min): 24 min  PT Assessment / Plan / Recommendation Comments on Treatment Session  Pt. needed a great deal of physical assist for transfers and will benefit from use of lift equipment with nursing staff.      Follow Up Recommendations  CIR;Supervision/Assistance - 24 hour     Does the patient have the potential to tolerate intense rehabilitation     Barriers to Discharge        Equipment Recommendations  Wheelchair (measurements PT);Wheelchair cushion (measurements PT)    Recommendations for Other Services    Frequency Min 4X/week   Plan Discharge plan remains appropriate    Precautions / Restrictions Precautions Precautions: Fall Restrictions Weight Bearing Restrictions: No   Pertinent Vitals/Pain BP 150/88.  Pain in right hip, longstanding, and did not want pain med.  Repositioned for comfort    Mobility  Bed Mobility Bed Mobility: Supine to Sit;Sitting - Scoot to Edge of Bed Supine to Sit: HOB elevated;3: Mod assist;With rails Sitting - Scoot to [place] of Bed: 2: Max assist Details for Bed Mobility Assistance: assist to sit upright at edge of bed and establish her balance Transfers Transfers: Sit to Stand;Stand to Sit;Stand Pivot Transfers Sit to Stand: 1: +2 Total assist;From bed Sit to Stand: Patient Percentage: 30% Stand to Sit: 1: +2 Total assist;To chair/3-in-1 Stand to Sit: Patient Percentage: 30% Stand Pivot Transfers: 1: +2 Total assist Stand Pivot Transfers: Patient Percentage: 30% Details for Transfer Assistance: Pt. requirde 2 assist and use of bed pad to complete transfer bed to recliner  Ambulation/Gait Ambulation/Gait Assistance: Not tested (comment)    Exercises     PT Diagnosis:    PT Problem List:   PT Treatment Interventions:     PT Goals Acute Rehab PT Goals Pt will go Supine/Side to  Sit: with min assist PT Goal: Supine/Side to Sit - Progress: Progressing toward goal Pt will Sit at Edge of Bed: with modified independence;with no upper extremity support PT Goal: Sit at Edge Of Bed - Progress: Progressing toward goal Pt will go Sit to Stand: with upper extremity assist;with mod assist PT Goal: Sit to Stand - Progress: Progressing toward goal Pt will go Stand to Sit: with min assist;with upper extremity assist PT Goal: Stand to Sit - Progress: Progressing toward goal  Visit Information  Last PT Received On: 11/20/12 Assistance Needed: +2    Subjective Data  Subjective: Pt. reports longstanding pain in right hip and surgery in her future.   Cognition  Cognition Overall Cognitive Status: Impaired Area of Impairment: Following commands;Attention Arousal/Alertness: Awake/alert Orientation Level: Other (comment) (unable to correctly identify her sister by name) Behavior During Session: Anxious Current Attention Level: Sustained Following Commands: Follows one step commands inconsistently    Balance  Balance Balance Assessed: Yes Static Sitting Balance Static Sitting - Balance Support: No upper extremity supported;Feet supported Static Sitting - Level of Assistance: 4: Min assist;5: Stand by assistance;Other (comment) (progressing to stand by assist) Static Sitting - Comment/# of Minutes: 5  End of Session PT - End of Session Equipment Utilized During Treatment: Gait belt Activity Tolerance: Patient limited by fatigue Patient left: in chair;with call bell/phone within reach;with family/visitor present Nurse Communication: Mobility status;Need for lift equipment   GP     Ferman Hamming 11/20/2012, 10:27 AM Weldon Picking PT Acute Rehab Services [phone] Beeper [phone]

## 2012-11-20 NOTE — Progress Notes (Signed)
Stroke Team Progress Note  HISTORY [name] is an 67 y.o. female who was brought into the hospital intialy due to experiencing headache last evening. It was also noted she had elevated BP by family. EMS was called and patient was brought to the ER. Patient blood pressure was found to be 172/127 and patient was found to be tachycardic and in A. fib with RVR. CT of the head was negative for acute infarct. At this time patient was started on Cardizem infusion and admitted. Due to increased complaints of vision neurology was called. Patient was sent to MRI and findings large acute left PCA infarct and small acute right PCA cortical infarct involving the occipital pole. Patient was not a TPA candidate secondary to delay in arrival.   SUBJECTIVE Family at bedside. Doing well. No complaints. Tolerating PO intake.  OBJECTIVE Most recent Vital Signs: Filed Vitals:   11/20/12 0011 11/20/12 0423 11/20/12 0809 11/20/12 1156  BP: 146/92 120/77 150/88 135/76  Pulse: 77 69 89 87  Temp: 98 F (36.7 C) 97.6 F (36.4 C) 99 F (37.2 C) 99 F (37.2 C)  TempSrc: Oral Oral Oral Oral  Resp: 24 17 22 19   Height:      Weight:  231 lb 14.8 oz (105.2 kg)    SpO2: 97% 97% 99% 91%   CBG (last 3)   Recent Labs  11/19/12 1818 11/19/12 2148 11/20/12 0753  GLUCAP 153* 126* 128*   IV Fluid Intake:   . sodium chloride Stopped (11/19/12 1400)    MEDICATIONS  . aspirin  81 mg Oral Daily  . diltiazem  60 mg Oral Q6H  . insulin aspart  0-9 Units Subcutaneous TID WC  . metoprolol tartrate  50 mg Oral BID  . rivaroxaban  15 mg Oral Q supper  . sodium chloride  3 mL Intravenous Q12H  . sodium chloride  3 mL Intravenous Q12H   PRN:  acetaminophen, acetaminophen, hydrALAZINE, ondansetron (ZOFRAN) IV, ondansetron  Diet:  Carb Control  Activity:    Up with assistance DVT Prophylaxis:  SCDs   CLINICALLY SIGNIFICANT STUDIES Basic Metabolic Panel:   Recent Labs Lab 11/19/12 0535 11/20/12 0630  NA 140  138  K 3.3* 3.6  CL 98 95*  CO2 32 35*  GLUCOSE 142* 120*  BUN 30* 28*  CREATININE 1.57* 1.72*  CALCIUM 9.3 9.5   Liver Function Tests:   Recent Labs Lab 11/18/12 0904  AST 17  ALT 19  ALKPHOS 69  BILITOT 0.2*  PROT 8.4*  ALBUMIN 3.7   CBC:   Recent Labs Lab 11/17/12 2347 11/18/12 0011 11/18/12 0904  WBC 9.9  --  8.7  NEUTROABS 6.5  --  7.0  HGB 12.2 13.3 12.2  HCT 36.4 39.0 36.1  MCV 87.1  --  87.0  PLT 348  --  368   Coagulation:   Recent Labs Lab 11/17/12 2347  LABPROT 13.2  INR 1.01   Cardiac Enzymes:   Recent Labs Lab 11/18/12 0330  TROPONINI <0.30   Urinalysis: No results found for this basename: COLORURINE, APPERANCEUR, LABSPEC, PHURINE, GLUCOSEU, HGBUR, BILIRUBINUR, KETONESUR, PROTEINUR, UROBILINOGEN, NITRITE, LEUKOCYTESUR,  in the last 168 hours Lipid Panel    Component Value Date/Time   CHOL 122 11/19/2012 0535   HgbA1C  Lab Results  Component Value Date   HGBA1C 6.1* 11/18/2012   Urine Drug Screen:     Component Value Date/Time   LABOPIA NONE DETECTED 11/18/2012 0345   COCAINSCRNUR NONE DETECTED 11/18/2012 0345  LABBENZ NONE DETECTED 11/18/2012 0345   AMPHETMU NONE DETECTED 11/18/2012 0345   THCU NONE DETECTED 11/18/2012 0345   LABBARB NONE DETECTED 11/18/2012 0345    Alcohol Level: No results found for this basename: ETH,  in the last 168 hours  CT of the brain  11/18/2012   1.  No acute intracranial pathology seen on CT. 2.  Scattered small vessel ischemic microangiopathy. 3.  Small chronic lacunar infarcts at the thalami bilaterally.    CT Angio of the Head 11/18/2012   1.  Proximal left posterior cerebral artery occlusion. 2.  Large left PCA territory infarct. 3.  Mild distal MCA small vessel disease, left greater than right.   MRI of the brain  11/18/2012  1.  Large acute left PCA infarct.  No significant mass effect at this time. 2.  Small acute right PCA cortical infarct involving the occipital pole. 3.  Underlying advanced chronic  small vessel ischemia. 4.  The examination had to be discontinued prior to completion.    MRA of the brain  See CT angio head  2D Echocardiogram  EF 60-65% with no source of embolus.   Carotid Doppler  Preliminary report: There is no ICA stenosis. Vertebral artery flow is antegrade.  CXR  11/18/2012   1.  Mild cardiomegaly and pulmonary vascular congestion suggesting early congestive heart failure. 2.  No significant airspace consolidation.    EKG  atrial fibrillation, rate 169.   Therapy Recommendations   Physical Exam   Obese middle-aged [place] American lady currently not in distress.Awake alert. Afebrile. Head is nontraumatic. Neck is supple without bruit. Hearing is normal. Cardiac exam IRREGULARLY IRREGULAR. No murmur or gallop.   Neurological exam Awake alert. EXPRESSIVE APHASIA, BUT ABLE TO SAY SOME WORDS AND PHRASES. SOME PERSEVERATION. FOLLOWS SIMPLE COMMANDS. Left gaze preference and unable to look past midline to the right. Does not blink to threat on the right but does so minimally on the left. Has only minimum left peripheral vision and is almost blind. Pupils AND REACTIVE. Mild right lower facial weakness. Tongue midline. Motor system exam shows mild right hemiparesis with right upper and lower extremity drift. Weakness of right grip and intrinsic hand muscles. Sensation and coordination difficult to interpret as patient not very cooperative. Right plantar upgoing left is downgoing. Gait was not tested.  ASSESSMENT [name] is a 67 y.o. female initially presenting with headache and confusion in the setting of new afib and accelerated hypertension, BP 170/127. She developed right hemiparesis and visual abnormalities after admission. Imaging confirms bilateral (left > right) PCA infarct. Infarcts felt to be embolic secondary to new onset atrial fibrillation. No on  antiplatelets prior to admission. Now on aspirin' for secondary stroke prevention. 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   atrial fibrillation  Accelerated hypertension Diabetes, HgbA1c 6.1, at goal < 7.0 Headache Morbid obesity, Body mass index is 45.29 kg/(m^2). Kidney disease, likely chronic, Cr 1.57 Family history of stroke  Hospital day # 3  TREATMENT/PLAN  xarelto for anticoagulation in setting of atrial fibrillation and stroke prevention  SBP goal < 180; gradually normalize over next 1 week  Will sign off. F/u with Dr. Pearlean Brownie in [facility] Stroke clinic in 2-3 months   Suanne Marker, MD 11/20/2012, 12:15 PM Certified in Neurology, Neurophysiology and Neuroimaging [facility] - Stroke Team  Please refer to [email or web address] for on-call Stroke MD

## 2012-11-20 NOTE — Progress Notes (Signed)
VASCULAR LAB PRELIMINARY  PRELIMINARY  PRELIMINARY  PRELIMINARY  Carotid Dopplers completed.    Preliminary report:  There is no ICA stenosis.  Vertebral artery flow is antegrade.  [name], RVT 11/20/2012, 10:22 AM

## 2012-11-20 NOTE — Progress Notes (Signed)
[facility] Progress Note [place] TEAM 1 - Stepdown/ICU TEAM   George Alcantar ZOX:[id number] DOB: [birth year] DOA: 11/17/2012 PCP: Astrid Divine, MD  Brief narrative: 67 y.o. female with history of diabetes mellitus type 2 and hypertension started experiencing headache last evening around 9 PM. It was mostly frontal and severe. EMS was called and patient was brought to the ER. Patient's blood pressure was found to be very high. In addition patient was found to be tachycardic and EKG showed patient is in A. fib with RVR which was new for her. CT of the head was negative for anything acute. Patient was nonfocal and denied any blurred vision, difficulty speaking or swallowing or any weakness of the upper lower extremities. Patient was found to be mildly confused initially. At time of admission the patient had been started on Cardizem infusion and has been admitted for further management. Patient denied any chest pain, shortness of breath, nausea/ vomiting or abdominal pain. Patient stated her blood pressure has not been well controlled recently and states that she has been compliant with her medications.  First 24 hours after admission patient initially had an appearance consistent with PRES during initial examination on the morning rounds. Subsequently later in the day when the attending physician returned to re-evaluate the patient she noticed some subtle focal neurological deficits involving the right arm and orbital ptosis and diplopia. Subsequently a positive MRI report was called revealing bilateral PCA territory strokes greater on the left. The MRI also revealed left parietal/temporal and left occipital involvement as well. This was associated with cytotoxic edema. There were also areas of chronic micro-hemorrhagic changes in the right cerebellar hemisphere. Neurology was consulted.   On 11/19/2012 patient continued to have symptoms consistent with evolving stroke. She had marked upper  right extremity weakness although sensation remained intact. She was somewhat weaker on the left lower extremity than the right lower extremity. She had marked ptosis of the left eye with right facial drooping. She does have a prominent visual gaze deficit as evidenced by bilateral hemianopsia with left sided visual field preserved and intermittent bouts of diplopia.  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 Pearlean Brownie - we have started Xarelto and an 81 mg ASA per recommendations    New Onset Atrial fibrillation with RVR -Cont BB- will be increasing dose closer to home dose-  - cont short acting CCB for now and modify dose base upon rate control once Metoprolol has been increased    CKD (chronic kidney disease) stage 3, GFR 30-59 ml/min -Has progressed since 2008  -since she needs IV contrast (will use low dose Omnipaque) we gave bicarb infusion @ 75/hr x 24 -Bmet does reveal a bump in Creatinine which needs to be followed - holding Metformin    Diabetes mellitus, controlled -HgbA1c 6.1-given her age and renal function this is an appropriate reading- too tight control in this pt population can lead to recurrent hypoglycemia -would PERMANENTLY discontinue Metformin and search for alternative oral agent  Headache - 2/2 high BP and new CVA- better per pt report   DVT prophylaxis: SCDs Code Status: Full Family Communication:  Patient  Disposition Plan: Transfer totelemetry- CIR eval requested Isolation: None  Consultants: Neurology  Procedures: 2-D echocardiogram (4/10) Study Conclusions  - Left ventricle: The cavity size was normal. There was mild concentric hypertrophy. Systolic function was normal. The estimated ejection fraction was in the range of 60% to 65%. Wall motion was normal; there were no regional wall motion abnormalities. - Mitral valve: Mild to moderate regurgitation. - Left atrium: The atrium was moderately to severely dilated. - Right atrium: The atrium was dilated. - Pulmonary arteries: Systolic pressure was moderately increased. PA peak pressure: 52mm Hg (S).  Carotid dopplers  pending  Antibiotics: None  HPI/Subjective: Patient alert and interactive; more verbal but continues to have some trouble with expressive aphasia- has no new complaints- eating and swallowing well   Objective: Blood pressure 150/88, pulse 89, temperature 99 F (37.2 C), temperature source Oral, resp. rate 22, height 5' (1.524 m), weight 105.2 kg (231 lb 14.8 oz), SpO2 99.00%.  Intake/Output Summary (Last 24 hours) at 11/20/12 1152 Last data filed at 11/20/12 0757  Gross per 24 hour  Intake    388 ml  Output   1200 ml  Net   -812 ml     Exam: General: No acute respiratory distress Lungs: Clear to auscultation bilaterally without wheezes or crackles, 2 L Cardiovascular: Irregular rate and rhythm without murmur gallop or rub normal S1 and S2, no peripheral edema or JVD Abdomen: Nontender, nondistended, soft, bowel sounds positive, no rebound, no ascites, no appreciable mass Musculoskeletal: No significant cyanosis, clubbing of bilateral lower extremities Neurological: Alert and oriented x 3,moves RT upper extremity but not on request- overall right side neglect; LLE somewhat weaker than RLE with strength 4/5; has marked left orbital ptosis and right facial droop, mild dysarthria and improved mixed  aphasia   Data Reviewed: Basic Metabolic Panel:  Recent Labs Lab 11/18/12 0011 11/18/12 0904 11/18/12 2134 11/19/12 0535 11/20/12 0630  NA 140 138 140 140 138  K 4.2 4.5 3.4* 3.3* 3.6  CL 103 98 100 98 95*  CO2  --  31 30 32 35*  GLUCOSE 148* 183* 130* 142* 120*  BUN 48* 38* 31* 30* 28*  CREATININE 1.70* 1.69* 1.48* 1.57* 1.72*  CALCIUM  --  9.9 9.8 9.3 9.5   Liver Function Tests:  Recent Labs Lab 11/18/12 0904  AST 17  ALT 19  ALKPHOS 69  BILITOT 0.2*  PROT 8.4*  ALBUMIN 3.7   No results found for this basename: LIPASE, AMYLASE,  in the last 168 hours No results found for this basename: AMMONIA,  in the last 168 hours CBC:  Recent Labs Lab 11/17/12 2347 11/18/12 0011 11/18/12 0904  WBC 9.9  --  8.7  NEUTROABS 6.5  --  7.0  HGB 12.2 13.3 12.2  HCT 36.4 39.0 36.1  MCV 87.1  --  87.0  PLT 348  --  368   Cardiac Enzymes:  Recent Labs Lab 11/18/12 0330  TROPONINI <0.30   BNP (last 3 results) No results found for this basename: PROBNP,  in the last 8760 hours CBG:  Recent Labs Lab 11/19/12 0834 11/19/12 1317 11/19/12 1818 11/19/12 2148 11/20/12 0753  GLUCAP 127* 129* 153* 126* 128*    Recent Results (from the past 240 hour(s))  MRSA PCR SCREENING     Status: None   Collection Time  11/18/12  4:54 AM      Result Value Range Status   MRSA by PCR NEGATIVE  NEGATIVE Final   Comment:            The GeneXpert MRSA Assay (FDA     approved for NASAL specimens     only), is one component of a     comprehensive MRSA colonization     surveillance program. It is not     intended to diagnose MRSA     infection nor to guide or     monitor treatment for     MRSA infections.     Studies:  Recent x-ray studies have been reviewed in detail by the Attending Physician  Scheduled Meds:  Reviewed in detail by the Attending Physician   Calvert Cantor, MD [phone]  If 7PM-7AM, please contact night-coverage [email or web address] Password  [facility] 11/20/2012, 11:52 AM   LOS: 3 days

## 2012-11-21 ENCOUNTER — Encounter (HOSPITAL_COMMUNITY): Payer: Self-pay | Admitting: *Deleted

## 2012-11-21 LAB — BASIC METABOLIC PANEL
BUN: 28 mg/dL — ABNORMAL HIGH (ref 6–23)
BUN: 28 mg/dL — ABNORMAL HIGH (ref 6–23)
Calcium: 9.2 mg/dL (ref 8.4–10.5)
Chloride: 97 mEq/L (ref 96–112)
Creatinine, Ser: 1.72 mg/dL — ABNORMAL HIGH (ref 0.50–1.10)
GFR calc Af Amer: 35 mL/min — ABNORMAL LOW (ref >90)
GFR calc Af Amer: 35 mL/min — ABNORMAL LOW (ref >90)
GFR calc non Af Amer: 30 mL/min — ABNORMAL LOW (ref >90)
Glucose, Bld: 120 mg/dL — ABNORMAL HIGH (ref 70–99)
Glucose, Bld: 121 mg/dL — ABNORMAL HIGH (ref 70–99)
Potassium: 4.2 mEq/L (ref 3.5–5.1)
Potassium: 3.4 mEq/L — ABNORMAL LOW (ref 3.5–5.1)
Sodium: 137 mEq/L (ref 135–145)

## 2012-11-21 LAB — GLUCOSE, CAPILLARY
Glucose-Capillary: 125 mg/dL — ABNORMAL HIGH (ref 70–99)
Glucose-Capillary: 138 mg/dL — ABNORMAL HIGH (ref 70–99)
Glucose-Capillary: 151 mg/dL — ABNORMAL HIGH (ref 70–99)

## 2012-11-21 MED ORDER — METOPROLOL SUCCINATE ER 50 MG PO TB24
50.0000 mg | ORAL_TABLET | Freq: Every day | ORAL | Status: DC
  Administered 2012-11-21 – 2012-11-24 (×4): 50 mg via ORAL
  Filled 2012-11-21 (×5): qty 1

## 2012-11-21 MED ORDER — DILTIAZEM HCL ER COATED BEADS 240 MG PO CP24
240.0000 mg | ORAL_CAPSULE | Freq: Every day | ORAL | Status: DC
  Administered 2012-11-21 – 2012-11-24 (×4): 240 mg via ORAL
  Filled 2012-11-21 (×4): qty 1

## 2012-11-21 NOTE — Progress Notes (Signed)
Pt transferred to room 4700 accompanied by daughter

## 2012-11-21 NOTE — Progress Notes (Signed)
[facility] Progress Note [place] TEAM 1 - Stepdown/ICU TEAM   Reham Slabaugh WUJ:[id number] DOB: 03-14-1946 DOA: 11/17/2012 PCP: Astrid Divine, MD  Brief narrative: 67 y.o. female with history of diabetes mellitus type 2 and hypertension started experiencing headache last evening around 9 PM. It was mostly frontal and severe. EMS was called and patient was brought to the ER. Patient's blood pressure was found to be very high. In addition patient was found to be tachycardic and EKG showed patient is in A. fib with RVR which was new for her. CT of the head was negative for anything acute. Patient was nonfocal and denied any blurred vision, difficulty speaking or swallowing or any weakness of the upper lower extremities. Patient was found to be mildly confused initially. At time of admission the patient had been started on Cardizem infusion and has been admitted for further management. Patient denied any chest pain, shortness of breath, nausea/ vomiting or abdominal pain. Patient stated her blood pressure has not been well controlled recently and states that she has been compliant with her medications.  First 24 hours after admission patient initially had an appearance consistent with PRES during initial examination on the morning rounds. Subsequently later in the day when attending physician returned to re-evaluate the patient she noticed some subtle focal neurological deficits involving the right arm and ptosis, right facial droop and diplopia. A positive MRI report was called revealing bilateral PCA territory strokes greater on the left. The MRI also revealed left parietal/temporal and left occipital involvement as well. This was associated with cytotoxic edema. There were also areas of chronic micro-hemorrhagic changes in the right cerebellar hemisphere. Neurology was consulted.   On 11/19/2012 patient continued to have symptoms consistent with evolving stroke. She had marked upper right  extremity weakness although sensation remained intact. She was somewhat weaker on the left lower extremity than the right lower extremity. She had marked ptosis of the left eye with right facial drooping. She does have a prominent visual gaze deficit as evidenced by bilateral hemianopsia with left sided visual field preserved and intermittent bouts of diplopia.  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 Pearlean Brownie - we have started Xarelto and an 81 mg ASA per recommendations    New Onset Atrial fibrillation with RVR -Cont BB- have increased dose to home dose - transition short acting CCB to long acting today    CKD (chronic kidney disease) stage 3, GFR 30-59 ml/min -Has progressed since 2008  -since she needs IV contrast (will use low dose Omnipaque) we gave bicarb infusion @ 75/hr x 24 -Bmet does reveal a bump in Creatinine which needs to be followed     Diabetes mellitus, controlled -HgbA1c 6.1-given her age and renal function this is an appropriate reading- too tight control in this pt population can lead to recurrent hypoglycemia -would PERMANENTLY discontinue Metformin and search for alternative oral agent - would recommend a different agent upon d/c- at this time sugars relatively controlled  Headache - 2/2 high BP and new CVA- better per pt report   DVT prophylaxis: SCDs Code Status: Full Family Communication: Patient   Disposition Plan: Transfer totelemetry- CIR eval requested Isolation: None  Consultants: Neurology  Procedures: 2-D echocardiogram (4/10) Study Conclusions  - Left ventricle: The cavity size was normal. There was mild concentric hypertrophy. Systolic function was normal. The estimated ejection fraction was in the range of 60% to 65%. Wall motion was normal; there were no regional wall motion abnormalities. - Mitral valve: Mild to moderate regurgitation. - Left atrium: The atrium was moderately to severely dilated. - Right atrium: The atrium was dilated. - Pulmonary arteries: Systolic pressure was moderately increased. PA peak pressure: 52mm Hg (S).  Carotid dopplers  pending  Antibiotics: None  HPI/Subjective: Patient alert and interactive; more verbal but continues to have some trouble with expressive aphasia- has no new complaints- eating and swallowing well   Objective: Blood pressure 135/77, pulse 92, temperature 98.6 F (37 C), temperature source Oral, resp. rate 21, height 5' (1.524 m), weight 109.5 kg (241 lb 6.5 oz), SpO2 96.00%.  Intake/Output Summary (Last 24 hours) at 11/21/12 1405 Last data filed at 11/21/12 0221  Gross per 24 hour  Intake    123 ml  Output    725 ml  Net   -602 ml     Exam: General: No acute respiratory distress Lungs: Clear to auscultation bilaterally without wheezes or crackles, 2 L Cardiovascular: Irregular rate and rhythm without murmur gallop or rub normal S1 and S2, no peripheral edema or JVD Abdomen: Nontender, nondistended, soft, bowel sounds positive, no rebound, no ascites, no appreciable mass Musculoskeletal: No significant cyanosis, clubbing of bilateral lower extremities Neurological: Alert and oriented x 3,moves RT upper extremity but not on request- overall right side neglect;; has marked left orbital ptosis and right facial droop, mild dysarthria and improved mixed aphasia   Data Reviewed: Basic Metabolic  Panel:  Recent Labs Lab 11/18/12 2134 11/19/12 0535 11/20/12 0630 11/21/12 0635 11/21/12 0840  NA 140 140 138 137 138  K 3.4* 3.3* 3.6 4.2 3.4*  CL 100 98 95* 96 97  CO2 30 32 35* 31 31  GLUCOSE 130* 142* 120* 120* 121*  BUN 31* 30* 28* 28* 28*  CREATININE 1.48* 1.57* 1.72* 1.74* 1.72*  CALCIUM 9.8 9.3 9.5 9.2 9.5   Liver Function Tests:  Recent Labs Lab 11/18/12 0904  AST 17  ALT 19  ALKPHOS 69  BILITOT 0.2*  PROT 8.4*  ALBUMIN 3.7   No results found for this basename: LIPASE, AMYLASE,  in the last 168 hours No results found for this basename: AMMONIA,  in the last 168 hours CBC:  Recent Labs Lab 11/17/12 2347 11/18/12 0011 11/18/12 0904  WBC 9.9  --  8.7  NEUTROABS 6.5  --  7.0  HGB 12.2 13.3 12.2  HCT 36.4 39.0 36.1  MCV 87.1  --  87.0  PLT 348  --  368   Cardiac Enzymes:  Recent Labs Lab 11/18/12 0330  TROPONINI <0.30   BNP (last 3 results) No results found for this basename: PROBNP,  in the last 8760 hours CBG:  Recent Labs Lab 11/20/12 1237 11/20/12 1707 11/20/12 2109 11/21/12 0827 11/21/12 1205  GLUCAP 147* 103* 151* 125* 138*    Recent Results (from the past 240 hour(s))  MRSA PCR SCREENING     Status: None   Collection Time    11/18/12  4:54 AM      Result Value  Range Status   MRSA by PCR NEGATIVE  NEGATIVE Final   Comment:            The GeneXpert MRSA Assay (FDA     approved for NASAL specimens     only), is one component of a     comprehensive MRSA colonization     surveillance program. It is not     intended to diagnose MRSA     infection nor to guide or     monitor treatment for     MRSA infections.     Studies:  Recent x-ray studies have been reviewed in detail by the Attending Physician  Scheduled Meds:  Reviewed in detail by the Attending Physician   Calvert Cantor, MD [phone]  If 7PM-7AM, please contact night-coverage [email or web address] Password [email or web address] 11/21/2012, 2:05 PM   LOS: 4 days

## 2012-11-21 NOTE — Progress Notes (Signed)
Foley d/c per removal protocol. Pt tolerated procedure well

## 2012-11-22 DIAGNOSIS — G8110 Spastic hemiplegia affecting unspecified side: Secondary | ICD-10-CM

## 2012-11-22 DIAGNOSIS — I69991 Dysphagia following unspecified cerebrovascular disease: Secondary | ICD-10-CM

## 2012-11-22 DIAGNOSIS — I69921 Dysphasia following unspecified cerebrovascular disease: Secondary | ICD-10-CM

## 2012-11-22 DIAGNOSIS — I6340 Cerebral infarction due to embolism of unspecified cerebral artery: Secondary | ICD-10-CM

## 2012-11-22 LAB — GLUCOSE, CAPILLARY
Glucose-Capillary: 118 mg/dL — ABNORMAL HIGH (ref 70–99)
Glucose-Capillary: 137 mg/dL — ABNORMAL HIGH (ref 70–99)
Glucose-Capillary: 207 mg/dL — ABNORMAL HIGH (ref 70–99)

## 2012-11-22 LAB — BASIC METABOLIC PANEL
BUN: 28 mg/dL — ABNORMAL HIGH (ref 6–23)
Creatinine, Ser: 1.74 mg/dL — ABNORMAL HIGH (ref 0.50–1.10)
GFR calc Af Amer: 35 mL/min — ABNORMAL LOW (ref >90)
GFR calc non Af Amer: 30 mL/min — ABNORMAL LOW (ref >90)

## 2012-11-22 NOTE — Progress Notes (Signed)
ANTICOAGULATION CONSULT NOTE - Follow up Consult  Pharmacy Consult for Xarelto Indication: atrial fibrillation  No Known Allergies  Patient Measurements: Height: 5' (152.4 cm) Weight: 240 lb 15.4 oz (109.3 kg) (bedscale) IBW/kg (Calculated) : 45.5 Heparin Dosing Weight: n/a  Vital Signs: Temp: 97.9 F (36.6 C) (04/14 0547) Temp src: Oral (04/14 0547) BP: 139/95 mmHg (04/14 0547) Pulse Rate: 86 (04/14 0547)  Labs:  Recent Labs  11/21/12 0635 11/21/12 0840 11/22/12 0530  CREATININE 1.74* 1.72* 1.74*    Estimated Creatinine Clearance: 36.6 ml/min (by C-G formula based on Cr of 1.74).   Medical History: Past Medical History  Diagnosis Date  . Hypertension   . Diabetes mellitus   . Glaucoma   . Cataract     Medications:  Scheduled:  . aspirin  81 mg Oral Daily  . diltiazem  240 mg Oral Daily  . insulin aspart  0-9 Units Subcutaneous TID WC  . metoprolol succinate  50 mg Oral Q breakfast  . rivaroxaban  15 mg Oral Q supper  . sodium chloride  3 mL Intravenous Q12H  . sodium chloride  3 mL Intravenous Q12H    Assessment: 67 yo female with stroke felt to be embolic secondary to new onset atrial fibrillation. Yesterday started on Xareto for secondary stroke prevention.   Also on ASA.  Requiring reduced Xarelto dosing due to CrCl < 50.  Scr today remains stable at 1.74.  No bleeding noted.  No new CBC available.  Of note patient is also taking diltiazem.  The recommendations per [employer] for Xarelto use in patients with renal impairment CrCl 15 to 80 mL/min, are to avoid concomitant use with drugs that are both P-glycoprotein inhibitors and moderate CYP3A4 inhibitors (ie,  Diltiazem) unless benefit outweighs potential bleeding risk .   Goal of Therapy:  Stroke prevention Monitor platelets by anticoagulation protocol: Yes   Plan:  1. No change in  Xarelto 15 mg po daily with supper. 2.  Xarelto education prior to discharge 3. Monitor for signs or symptoms of  bleeding.  Noah Delaine, RPh Clinical Pharmacist Pager: [phone]  11/22/2012 11:56 AM

## 2012-11-22 NOTE — Progress Notes (Signed)
Physical Therapy Treatment Patient Details Name: [name] MRN: [id number] DOB: [birth year] Today's Date: 11/22/2012 Time: 4098-1191 PT Time Calculation (min): 23 min  PT Assessment / Plan / Recommendation Comments on Treatment Session  Pt cont's to require +2 (A) for all mobility.   C/o Lt hip pain with mobility (sister reports pt was supposed to have hip surgery).      Follow Up Recommendations  CIR;Supervision/Assistance - 24 hour     Does the patient have the potential to tolerate intense rehabilitation     Barriers to Discharge        Equipment Recommendations  Wheelchair (measurements PT);Wheelchair cushion (measurements PT)    Recommendations for Other Services    Frequency Min 4X/week   Plan Discharge plan remains appropriate    Precautions / Restrictions Precautions Precautions: Fall Restrictions Weight Bearing Restrictions: No Other Position/Activity Restrictions: Per sister in room, pt had a bad right hip before this and was to have surgery, this is really painful to her now   Pertinent Vitals/Pain C/o Lt hip pain.  RN made aware.      Mobility  Bed Mobility Bed Mobility: Rolling Right;Rolling Left;Right Sidelying to Sit;Sitting - Scoot to [place] of Bed Rolling Right: 3: Mod assist;With rail Rolling Left: 2: Max assist;With rail Right Sidelying to Sit: 1: +2 Total assist Right Sidelying to Sit: Patient Percentage: 20% Sitting - Scoot to Edge of Bed: 1: +2 Total assist Sitting - Scoot to Edge of Bed: Patient Percentage: 0% Details for Bed Mobility Assistance: Max directional cues for technique.  (A) for LE''s, to lift shoulders/trunk to sitting upright, & use of draw pad to bring hips closer to EOB.   (A)/support for pt to establish her balance once sitting EOB Transfers Transfers: Sit to Stand;Stand to Sit Sit to Stand: From elevated surface;1: +2 Total assist Sit to Stand: Patient Percentage: 50% Stand to Sit: 1: +2 Total assist;To chair/3-in-1 Stand to  Sit: Patient Percentage: 0% Stand Pivot Transfers: 1: +2 Total assist Stand Pivot Transfers: Patient Percentage: 30% Details for Transfer Assistance: Use of draw pad to craddle pt's hips to lift & pivot around to recliner.  Pt unable to move LE"s.    Ambulation/Gait Ambulation/Gait Assistance: Not tested (comment)     PT Goals Acute Rehab PT Goals Time For Goal Achievement: 12/03/12 Potential to Achieve Goals: Good Pt will Roll Supine to Right Side: with rail;with supervision PT Goal: Rolling Supine to Right Side - Progress: Progressing toward goal Pt will Roll Supine to Left Side: with min assist;with rail PT Goal: Rolling Supine to Left Side - Progress: Progressing toward goal Pt will go Supine/Side to Sit: with min assist PT Goal: Supine/Side to Sit - Progress: Not met Pt will Sit at [facility] of Bed: with modified independence;with no upper extremity support PT Goal: Sit at Edge Of Bed - Progress: Progressing toward goal Pt will go Sit to Supine/Side: with mod assist Pt will go Sit to Stand: with upper extremity assist;with mod assist PT Goal: Sit to Stand - Progress: Not met Pt will go Stand to Sit: with min assist;with upper extremity assist PT Goal: Stand to Sit - Progress: Progressing toward goal Pt will Stand: with min assist;1 - 2 min;with unilateral upper extremity support Pt will Ambulate: 16 - 50 feet;with least restrictive assistive device;with mod assist  Visit Information  Last PT Received On: 11/22/12 Assistance Needed: +2    Subjective Data      Cognition  Cognition Overall Cognitive Status: Impaired Area of  Impairment: Following commands;Attention;Awareness of errors Arousal/Alertness: Awake/alert Orientation Level: Other (comment) (she was able to identify her sister by name today) Current Attention Level: Sustained Following Commands: Follows one step commands inconsistently;Follows one step commands with increased time Safety/Judgement: Decreased safety  judgement for tasks assessed;Decreased awareness of need for assistance Safety/Judgement - Other Comments: Says she wants to go home today, but not realizing that she is physically unable at this time Awareness of Errors: Assistance required to identify errors made;Assistance required to correct errors made    Balance  Static Sitting Balance Static Sitting - Balance Support: Left upper extremity supported;Feet supported Static Sitting - Level of Assistance: 5: Stand by assistance;4: Min assist Static Sitting - Comment/# of Minutes: Cues + faciliation for sitting midline.  Had pt place her Lt hand in lap due to she had tendency to push towards Rt with L UE.   Static Standing Balance Static Standing - Balance Support: Bilateral upper extremity supported Static Standing - Level of Assistance: 1: +2 Total assist Static Standing - Comment/# of Minutes: stood about 45 secs +2 total (A) pt=50%, but did not achieve standing completely upright despite faciliation at pelvis & chest.    End of Session PT - End of Session Equipment Utilized During Treatment: Gait belt Activity Tolerance: Patient limited by pain;Patient limited by fatigue Patient left: in chair;with call bell/phone within reach;with family/visitor present Nurse Communication: Mobility status;Need for lift equipment     Verdell Face, [place] [phone] 11/22/2012

## 2012-11-22 NOTE — Evaluation (Signed)
Occupational Therapy Evaluation Patient Details Name: Chalonda Schlatter MRN: [id number] DOB: 07/16/1946 Today's Date: 11/22/2012 Time: 0865-7846 OT Time Calculation (min): 44 min  OT Assessment / Plan / Recommendation Clinical Impression  This 67 yo female admitted with HA, HTN, and A-fib with RVR and then started having stroke symptoms and found to have large left PCA and small right PCA infarct presents to acute OT with problems below. Will benefit from acute OT with follow up at CIR.    OT Assessment  Patient needs continued OT Services    Follow Up Recommendations  CIR    Barriers to Discharge Decreased caregiver support (maybe)    Equipment Recommendations   (TBD)       Frequency  Min 3X/week    Precautions / Restrictions Precautions Precautions: Fall Restrictions Weight Bearing Restrictions: No Other Position/Activity Restrictions: Per sister in room, pt had a bad right hip before this and was to have surgery, this is really painful to her now   Pertinent Vitals/Pain Right hip pain--bad hip PTA, per sister was suppose to have surgery    ADL  Eating/Feeding: Simulated;Minimal assistance (with max cues) Where Assessed - Eating/Feeding: Bed level Grooming: Simulated;Minimal assistance (with max cues) Where Assessed - Grooming: Supine, head of bed up Upper Body Bathing: Simulated;Moderate assistance (with max cues) Where Assessed - Upper Body Bathing: Supine, head of bed up;Supine, head of bed flat;Rolling right and/or left Lower Body Bathing: Simulated;+1 Total assistance (with max cues) Where Assessed - Lower Body Bathing: Supine, head of bed up;Supine, head of bed flat;Rolling right and/or left Upper Body Dressing: Simulated;+1 Total assistance (with max cues) Where Assessed - Upper Body Dressing: Supine, head of bed up;Supine, head of bed flat;Rolling right and/or left Lower Body Dressing: Simulated;+1 Total assistance (with max cues) Where Assessed - Lower Body Dressing:  Supine, head of bed up;Supine, head of bed flat;Rolling right and/or left Toilet Transfer: Simulated;+2 Total assistance Toilet Transfer: Patient Percentage:  (came up to stand with 50% (+2), pivot 20% (+2)) Toilet Transfer Method: Stand pivot Toilet Transfer Equipment:  (raised be to recliner going to pt's left) Toileting - Clothing Manipulation and Hygiene: Simulated;+1 Total assistance Where Assessed - Toileting Clothing Manipulation and Hygiene: Supine, head of bed flat;Rolling right and/or left;Sit on 3-in-1 or toilet Equipment Used: Gait belt Transfers/Ambulation Related to ADLs: Pt total A +2 sit to stand, stand to sit 50%; ambulation---pt unable at present    OT Diagnosis: Generalized weakness;Cognitive deficits;Hemiplegia dominant side;Acute pain;Disturbance of vision  OT Problem List: Decreased strength;Decreased range of motion;Decreased activity tolerance;Impaired balance (sitting and/or standing);Impaired vision/perception;Decreased coordination;Decreased cognition;Decreased knowledge of use of DME or AE;Impaired UE functional use;Pain OT Treatment Interventions: Self-care/ADL training;Neuromuscular education;Therapeutic exercise;DME and/or AE instruction;Patient/family education;Visual/perceptual remediation/compensation;Cognitive remediation/compensation;Therapeutic activities;Balance training   OT Goals Acute Rehab OT Goals OT Goal Formulation: With patient Time For Goal Achievement: 12/06/12 Potential to Achieve Goals: Good Miscellaneous OT Goals Miscellaneous OT Goal #1: Pt will be able to roll right and left with min A with HOB flat and use of rail to A with BADLs and in prep for sitting at EOB. OT Goal: Miscellaneous Goal #1 - Progress: Goal set today Miscellaneous OT Goal #2: Pt will be able to sit EOB for 10 minutes to work on grooming activites--finding the items she needs for the tasks. OT Goal: Miscellaneous Goal #2 - Progress: Goal set today Miscellaneous OT Goal #3:  Pt will be able to maintain standing in [place] for 10+ minutes to work on weightbearing through upper and  lower extremities and increasing core strength as a precursor to BADLs. OT Goal: Miscellaneous Goal #3 - Progress: Goal set today Miscellaneous OT Goal #4: Pt's family will be independent with Bil UE A/AAROM with pt. OT Goal: Miscellaneous Goal #4 - Progress: Goal set today  Visit Information  Last OT Received On: 11/22/12 Assistance Needed: +2 PT/OT Co-Evaluation/Treatment: Yes (partial)    Subjective Data  Subjective: "I want to go home today"   Prior Functioning     Home Living Lives With: Daughter Available Help at Discharge: Available PRN/intermittently;Family Type of Home: House Home Layout: One level Prior Function Level of Independence: Independent Communication Communication: Expressive difficulties;Receptive difficulties Dominant Hand: Right         Vision/Perception Vision - History Baseline Vision: Wears glasses only for reading (not often, needs new glasses per sister) Patient Visual Report: Peripheral vision impairment;Central vision impairment Vision - Assessment Eye Alignment: Impaired (comment) Vision Assessment: Vision tested Additional Comments: Pt with tendency to keep eyes and head turned to the left; however she can get eyes half past midline with increased time and effort (but not maintain); follows tracking to midline from far left, but then looses site of object; no other testing completed  due to pt's inability to follow commands for testing due to receptive and expressive issues.   Cognition  Cognition Overall Cognitive Status: Impaired Area of Impairment: Following commands;Attention;Awareness of errors Arousal/Alertness: Awake/alert Orientation Level:  (she was able to identify her sister by name today) Behavior During Session: Restless (once we started moving (even with rolling in bed)) Current Attention Level: Sustained Following  Commands: Follows one step commands inconsistently;Follows one step commands with increased time Safety/Judgement: Decreased safety judgement for tasks assessed;Decreased awareness of need for assistance Safety/Judgement - Other Comments: Says she wants to go home today, but not realizing that she is physically unable at this time    Extremity/Trunk Assessment Right Upper Extremity Assessment RUE ROM/Strength/Tone: Deficits RUE ROM/Strength/Tone Deficits: AAROM [facility] for all joints; supine: AROM of shoulder flexion to about 60 degrees, extension from this 60 degrees to full with control, elbow flexion and extension full, forearm supination to 45 degrees, full pronation, full wrist extension and flexion, digit flexion and extension full---all movements are slow and take increased time. RUE Coordination: Deficits Left Upper Extremity Assessment LUE ROM/Strength/Tone: Deficits LUE ROM/Strength/Tone Deficits: Grossly 3/5 at close to normal rate of speed     Mobility Bed Mobility Bed Mobility: Rolling Right;Rolling Left;Right Sidelying to Sit;Sitting - Scoot to [place] of Bed Rolling Right: 3: Mod assist;With rail Rolling Left: 2: Max assist;With rail Right Sidelying to Sit: 1: +2 Total assist Right Sidelying to Sit: Patient Percentage: 20% Sitting - Scoot to Edge of Bed: 1: +2 Total assist Sitting - Scoot to Edge of Bed: Patient Percentage: 0% Transfers Transfers: Sit to Stand;Stand to Sit Sit to Stand: From elevated surface;1: +2 Total assist Sit to Stand: Patient Percentage: 50% Stand to Sit: 1: +2 Total assist;To chair/3-in-1 Stand to Sit: Patient Percentage: 0% Details for Transfer Assistance: used bed pad as well for stand pivot to recliner going to pt's left        Balance Static Sitting Balance Static Sitting - Balance Support: Left upper extremity supported;Feet supported Static Sitting - Level of Assistance:  (min guard A) Static Sitting - Comment/# of Minutes: 8 minutes Static  Standing Balance Static Standing - Balance Support: Bilateral upper extremity supported Static Standing - Level of Assistance: 1: +2 Total assist Static Standing - Comment/# of Minutes:  stood about 61 seconda with 50%, but not fully errect   End of Session OT - End of Session Equipment Utilized During Treatment: Gait belt Activity Tolerance: Patient limited by fatigue;Patient limited by pain Patient left: in chair;with call bell/phone within reach;with family/visitor present Nurse Communication: Patient requests pain meds;Need for lift equipment (maxi move, pad under pt)       Evette Georges [phone] 11/22/2012, 10:26 AM

## 2012-11-22 NOTE — Progress Notes (Addendum)
Rehab Admissions Coordinator Note:  Patient was screened by Brock Ra for appropriateness for an Inpatient Acute Rehab Consult.  At this time, we are recommending Inpatient Rehab consult, which has already been ordered.  Brock Ra 11/22/2012, 8:55 AM  I can be reached at [phone].

## 2012-11-22 NOTE — Progress Notes (Signed)
[facility] Progress Note [place] TEAM 1 - Stepdown/ICU TEAM   [name] ZOX:[id number] DOB: [birth year] DOA: 11/17/2012 PCP: Astrid Divine, MD  Brief narrative: 67 y.o. female with history of diabetes mellitus type 2 and hypertension started experiencing headache last evening around 9 PM. It was mostly frontal and severe. EMS was called and patient was brought to the ER. Patient's blood pressure was found to be very high. In addition patient was found to be tachycardic and EKG showed patient is in A. fib with RVR which was new for her. CT of the head was negative for anything acute. Patient was nonfocal and denied any blurred vision, difficulty speaking or swallowing or any weakness of the upper lower extremities. Patient was found to be mildly confused initially. At time of admission the patient had been started on Cardizem infusion and has been admitted for further management. Patient denied any chest pain, shortness of breath, nausea/ vomiting or abdominal pain. Patient stated her blood pressure has not been well controlled recently and states that she has been compliant with her medications.  First 24 hours after admission patient initially had an appearance consistent with PRES during initial examination on the morning rounds. Subsequently later in the day when attending physician returned to re-evaluate the patient she noticed some subtle focal neurological deficits involving the right arm and ptosis, right facial droop and diplopia. A positive MRI report was called revealing bilateral PCA territory strokes greater on the left. The MRI also revealed left parietal/temporal and left occipital involvement as well. This was associated with cytotoxic edema. There were also areas of chronic micro-hemorrhagic changes in the right cerebellar hemisphere. Neurology was consulted.   On 11/19/2012 patient continued to have symptoms consistent with evolving stroke. She had marked upper right  extremity weakness although sensation remained intact. She was somewhat weaker on the left lower extremity than the right lower extremity. She had marked ptosis of the left eye with right facial drooping. She does have a prominent visual gaze deficit as evidenced by bilateral hemianopsia with left sided visual field preserved and intermittent bouts of diplopia.  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 Pearlean Brownie - we have started Xarelto and an 81 mg ASA per recommendations    New Onset Atrial fibrillation with RVR -Cont BB- have increased to home dose - Short acting CCB transitioned to long acting 4/13    CKD (chronic kidney disease) stage 3, GFR 30-59 ml/min -Has progressed since 2008  -Because she was given IV contrast  for CTA Head we gave bicarb infusion @ 75/hr x 24 -Bmet did reveal a slight bump in Creatinine which needs to be followed    Diabetes mellitus, controlled -HgbA1c 6.1-given her age and renal function this is an appropriate reading- too tight control in this pt population can lead to recurrent hypoglycemia -would PERMANENTLY discontinue Metformin - would recommend a different agent upon d/c- at this time sugars relatively controlled    Headache - 2/2 high BP and new CVA- better per pt report   DVT prophylaxis: SCDs Code Status: Full  Family Communication: Patient  Disposition Plan: Transfer to telemetry- CIR eval requested Isolation:  None  Consultants: Neurology  Procedures: 2-D echocardiogram (4/10) Study Conclusions - Left ventricle: The cavity size was normal. There was mild concentric hypertrophy. Systolic function was normal. The estimated ejection fraction was in the range of 60% to 65%. Wall motion was normal; there were no regional wall motion abnormalities. - Mitral valve: Mild to moderate regurgitation. - Left atrium: The atrium was moderately to severely dilated. - Right atrium: The atrium was dilated. - Pulmonary arteries: Systolic pressure was moderately increased. PA peak pressure: 52mm Hg (S).  Carotid dopplers cannot locate final report in EPIC- preliminary is negative  Antibiotics: None  HPI/Subjective: Patient alert and interactive;aphasia resolved, able to use RUE more easily but still with some neglect   Objective: Blood pressure 141/86, pulse 82, temperature 97.8 F (36.6 C), temperature source Oral, resp. rate 18, height 5' (1.524 m), weight 109.3 kg (240 lb 15.4 oz), SpO2 100.00%.  Intake/Output Summary (Last 24 hours) at 11/22/12 1450 Last data filed at 11/22/12 1312  Gross per 24 hour  Intake    363 ml  Output      0 ml  Net    363 ml     Exam: General: No acute respiratory distress Lungs: Clear to auscultation bilaterally without wheezes or crackles, RA Cardiovascular: Irregular rate and rhythm without murmur gallop or rub normal S1 and S2, no peripheral edema or JVD Abdomen: Nontender, nondistended, soft, bowel sounds positive, no rebound, no ascites, no appreciable mass Musculoskeletal: No significant cyanosis, clubbing of bilateral lower extremities Neurological: Alert and oriented x 3, moves RUE on command and able to lift over her head but still weaker than left; has improving left ptosis and right facial droop, no dysarthria and improved mixed aphasia   Data Reviewed: Basic Metabolic Panel:  Recent Labs Lab 11/19/12 0535 11/20/12 0630 11/21/12 0635  11/21/12 0840 11/22/12 0530  NA 140 138 137 138 138  K 3.3* 3.6 4.2 3.4* 3.9  CL 98 95* 96 97 99  CO2 32 35* 31 31 29   GLUCOSE 142* 120* 120* 121* 122*  BUN 30* 28* 28* 28* 28*  CREATININE 1.57* 1.72* 1.74* 1.72* 1.74*  CALCIUM 9.3 9.5 9.2 9.5 9.2   Liver Function Tests:  Recent Labs Lab 11/18/12 0904  AST 17  ALT 19  ALKPHOS 69  BILITOT 0.2*  PROT 8.4*  ALBUMIN 3.7   CBC:  Recent Labs Lab 11/17/12 2347 11/18/12 0011 11/18/12 0904  WBC 9.9  --  8.7  NEUTROABS 6.5  --  7.0  HGB 12.2 13.3 12.2  HCT 36.4 39.0 36.1  MCV 87.1  --  87.0  PLT 348  --  368   Cardiac Enzymes:  Recent Labs Lab 11/18/12 0330  TROPONINI <0.30   CBG:  Recent Labs Lab 11/21/12 0827 11/21/12 1205 11/21/12 1611 11/21/12 2055 11/22/12 1132  GLUCAP 125* 138* 151* 159* 199*    Recent Results (from the past 240 hour(s))  MRSA PCR SCREENING     Status: None   Collection Time    11/18/12  4:54 AM      Result Value Range Status   MRSA by PCR NEGATIVE  NEGATIVE Final   Comment:            The GeneXpert MRSA Assay (FDA     approved for NASAL specimens     only), is one component of a     comprehensive MRSA colonization  surveillance program. It is not     intended to diagnose MRSA     infection nor to guide or     monitor treatment for     MRSA infections.     Studies:  Recent x-ray studies have been reviewed in detail by the Attending Physician  Scheduled Meds:  Reviewed in detail by the Attending Physician   Junious Silk, ANP [phone]  If 7PM-7AM, please contact night-coverage [email or web address] Password [email or web address] 11/22/2012, 2:50 PM   LOS: 5 days   Lonia Blood, MD [facility] Office  [phone] Pager [phone]  On-Call/Text Page:      [email or web address]      password [facility]

## 2012-11-22 NOTE — Consult Note (Signed)
Physical Medicine and Rehabilitation Consult Reason for Consult: CVA Referring Physician: [facility]   HPI: [name] is a 67 y.o. right-handed female with history of hypertension, diabetes mellitus with peripheral neuropathy. Admitted 11/18/2012 with headache and elevated blood pressure 172/127. EKG showed atrial fibrillation with RVR. 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 Echocardiogram with ejection fraction of 65% and no wall motion abnormalities. Carotid Dopplers with no ICA stenosis. Patient did not receive TPA. Started on Cardizem infusion for atrial fibrillation and transition to by mouth. 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 Neurology services consulted maintained on aspirin therapy as well as Xarelto. Physical and occupational therapy evaluations completed 11/19/2012 with recommendations of physical medicine rehabilitation consult to consider inpatient rehabilitation services.   Review of Systems  Gastrointestinal: Positive for constipation.  Neurological: Positive for dizziness and headaches.  All other systems reviewed and are negative.   Past Medical History  Diagnosis Date  . Hypertension   . Diabetes mellitus   . Glaucoma   . Cataract    History reviewed. No pertinent past surgical history. Family History  Problem Relation Age of Onset  . Stroke Mother   . Diabetes type II Mother   . CAD Father    Social History:  reports that she has never smoked. She does not have any smokeless tobacco history on file. She reports that she does not drink alcohol or use illicit drugs. Allergies: No Known Allergies Medications Prior to Admission  Medication Sig Dispense Refill  . amLODipine-benazepril (LOTREL) 10-20 MG per capsule Take 1 capsule by mouth daily.      . hydrochlorothiazide (HYDRODIURIL) 25 MG tablet Take 25 mg by mouth daily.      . metFORMIN (GLUCOPHAGE) 500 MG tablet  Take 500 mg by mouth 2 (two) times daily with a meal.      . metoprolol succinate (TOPROL-XL) 50 MG 24 hr tablet Take 50 mg by mouth daily. Take with or immediately following a meal.        Home: Home Living Lives With: Daughter Available Help at Discharge: Available PRN/intermittently;Family (daughter works, but has other Journalist, newspaper who may can help) Type of Home: House Home Layout: One level  Functional History: Prior Function Driving: Yes Comments: unsure due to pt wax/wane cognition and ability to communicat (question cane or walker for ambulation prior due to some hip pain) Functional Status:  Mobility: Bed Mobility Bed Mobility: Supine to Sit;Sitting - Scoot to Edge of Bed Supine to Sit: HOB elevated;3: Mod assist;With rails Sitting - Scoot to Edge of Bed: 2: Max assist Sit to Supine: HOB flat;1: +2 Total assist Sit to Supine: Patient Percentage: 10% Transfers Transfers: Sit to Stand;Stand to Sit;Stand Pivot Transfers Sit to Stand: 1: +2 Total assist;From bed Sit to Stand: Patient Percentage: 30% Stand to Sit: 1: +2 Total assist;To chair/3-in-1 Stand to Sit: Patient Percentage: 30% Stand Pivot Transfers: 1: +2 Total assist Stand Pivot Transfers: Patient Percentage: 30% Ambulation/Gait Ambulation/Gait Assistance: Not tested (comment)    ADL:    Cognition: Cognition Overall Cognitive Status: Other (comment) (to be assessed as language allows) Arousal/Alertness: Awake/alert Orientation Level: Oriented to person;Disoriented to place;Disoriented to time;Disoriented to situation Cognition Overall Cognitive Status: Impaired Area of Impairment: Following commands;Attention Arousal/Alertness: Awake/alert Orientation Level: Other (comment) (unable to correctly identify her sister by name) Behavior During Session: Anxious Current Attention Level: Sustained Attention - Other Comments: has difficulty focusing long due to frustration  and fatigue as well as distracted by her own  deficits Following Commands: Follows one step commands inconsistently Safety/Judgement - Other Comments: seems fearful to move due to vision deficits and right inattention  Blood pressure 139/95, pulse 86, temperature 97.9 F (36.6 C), temperature source Oral, resp. rate 20, height 5' (1.524 m), weight 109.3 kg (240 lb 15.4 oz), SpO2 93.00%. Physical Exam  Vitals reviewed. Eyes:  Pupils reactive to light  Neck: Neck supple. No thyromegaly present.  Cardiovascular:  Cardiac rate controlled  Pulmonary/Chest: Effort normal and breath sounds normal.  Abdominal: Bowel sounds are normal. She exhibits no distension.  Neurological: She is alert.  Patient is appropriate to basic conversation during exam. She followed simple commands. She was able to provide her name and age. Noted left gaze preference.  Poorly responsive.Oriented to self only,Receptive language deficits noted Dense right homonymous hemianopsia with poor compensation Motor strength 3 minus in the right deltoid, biceps, triceps, grip, hip flexor knee extensor ankle dorsiflexor plantar flexor 4/5 on the left side Unable to assess sensory secondary to lethargy and aphasia  Results for orders placed during the hospital encounter of 11/17/12 (from the past 24 hour(s))  GLUCOSE, CAPILLARY     Status: Abnormal   Collection Time    11/21/12  8:27 AM      Result Value Range   Glucose-Capillary 125 (*) 70 - 99 mg/dL  BASIC METABOLIC PANEL     Status: Abnormal   Collection Time    11/21/12  8:40 AM      Result Value Range   Sodium 138  135 - 145 mEq/L   Potassium 3.4 (*) 3.5 - 5.1 mEq/L   Chloride 97  96 - 112 mEq/L   CO2 31  19 - 32 mEq/L   Glucose, Bld 121 (*) 70 - 99 mg/dL   BUN 28 (*) 6 - 23 mg/dL   Creatinine, Ser 0.86 (*) 0.50 - 1.10 mg/dL   Calcium 9.5  8.4 - 57.8 mg/dL   GFR calc non Af Amer 30 (*) >90 mL/min   GFR calc Af Amer 35 (*) >90 mL/min  GLUCOSE, CAPILLARY     Status: Abnormal   Collection Time    11/21/12  12:05 PM      Result Value Range   Glucose-Capillary 138 (*) 70 - 99 mg/dL  GLUCOSE, CAPILLARY     Status: Abnormal   Collection Time    11/21/12  4:11 PM      Result Value Range   Glucose-Capillary 151 (*) 70 - 99 mg/dL   Comment 1 Documented in Chart     Comment 2 Notify RN    GLUCOSE, CAPILLARY     Status: Abnormal   Collection Time    11/21/12  8:55 PM      Result Value Range   Glucose-Capillary 159 (*) 70 - 99 mg/dL   Comment 1 Notify RN     Comment 2 Documented in Chart     No results found.  Assessment/Plan: Diagnosis: Large left PCA infarct with receptive aphasia, lethargy, right homonymous hemianopsia, right hemiparesis 1. Does the need for close, 24 hr/day medical supervision in concert with the patient's rehab needs make it unreasonable for this patient to be served in a less intensive setting? Potentially 2. Co-Morbidities requiring supervision/potential complications: Chronic kidney disease, diabetes, atrial fibrillation with rapid ventricular response, hypertension 3. Due to bladder management, bowel management, safety, skin/wound care, disease management, medication administration, pain management and patient education, does the patient require 24  hr/day rehab nursing? Potentially 4. Does the patient require coordinated care of a physician, rehab nurse, PT (1-2 hrs/day, 5 days/week), OT (1-2 hrs/day, 5 days/week) and SLP (0.5-1 hrs/day, 5 days/week) to address physical and functional deficits in the context of the above medical diagnosis(es)? Potentially Addressing deficits in the following areas: balance, endurance, locomotion, strength, transferring, bowel/bladder control, bathing, dressing, feeding, grooming, toileting, cognition, speech, language, swallowing and psychosocial support 5. Can the patient actively participate in an intensive therapy program of at least 3 hrs of therapy per day at least 5 days per week? No 6. The potential for patient to make measurable  gains while on inpatient rehab is fair 7. Anticipated functional outcomes upon discharge from inpatient rehab are Not applicable with PT, Not applicable with OT, Not applicable with SLP. 8. Estimated rehab length of stay to reach the above functional goals is: Not applicable 9. Does the patient have adequate social supports to accommodate these discharge functional goals? Potentially 10. Anticipated D/C setting: Home 11. Anticipated post D/C treatments: HH therapy 12. Overall Rehab/Functional Prognosis: fair  RECOMMENDATIONS: This patient's condition is appropriate for continued rehabilitative care in the following setting: Anticipate patient should be ready for CIR level in 2-3 days. Will follow rehabilitation progress Patient has agreed to participate in recommended program. Potentially Note that insurance prior authorization may be required for reimbursement for recommended care.  Comment:    11/22/2012

## 2012-11-23 DIAGNOSIS — I509 Heart failure, unspecified: Secondary | ICD-10-CM

## 2012-11-23 DIAGNOSIS — G8110 Spastic hemiplegia affecting unspecified side: Secondary | ICD-10-CM

## 2012-11-23 DIAGNOSIS — I6330 Cerebral infarction due to thrombosis of unspecified cerebral artery: Secondary | ICD-10-CM

## 2012-11-23 LAB — BASIC METABOLIC PANEL
Chloride: 101 mEq/L (ref 96–112)
Creatinine, Ser: 1.66 mg/dL — ABNORMAL HIGH (ref 0.50–1.10)
GFR calc Af Amer: 37 mL/min — ABNORMAL LOW (ref >90)
GFR calc non Af Amer: 32 mL/min — ABNORMAL LOW (ref >90)
Potassium: 4 mEq/L (ref 3.5–5.1)

## 2012-11-23 LAB — GLUCOSE, CAPILLARY: Glucose-Capillary: 131 mg/dL — ABNORMAL HIGH (ref 70–99)

## 2012-11-23 NOTE — Progress Notes (Signed)
Physical Therapy Treatment Patient Details Name: [name] MRN: [id number] DOB: 06/21/1946 Today's Date: 11/23/2012 Time: 7829-5621 PT Time Calculation (min): 25 min  PT Assessment / Plan / Recommendation Comments on Treatment Session  Pt progressing slowly with mobility.  Pleasant but requires encouragement throughout.      Follow Up Recommendations  CIR;Supervision/Assistance - 24 hour     Does the patient have the potential to tolerate intense rehabilitation     Barriers to Discharge        Equipment Recommendations  Wheelchair (measurements PT);Wheelchair cushion (measurements PT)    Recommendations for Other Services    Frequency Min 4X/week   Plan Discharge plan remains appropriate    Precautions / Restrictions Precautions Precautions: Fall Restrictions Weight Bearing Restrictions: No       Mobility  Bed Mobility Bed Mobility: Rolling Right;Rolling Left Rolling Right: 3: Mod assist;With rail Rolling Left: 2: Max assist;With rail Right Sidelying to Sit: 1: +2 Total assist;With rails Right Sidelying to Sit: Patient Percentage: 20% Sitting - Scoot to Edge of Bed: 1: +1 Total assist Sitting - Scoot to Edge of Bed: Patient Percentage: 0% Details for Bed Mobility Assistance: Cues for sequencing & technique.  (A) to lift shoulders/trunk to sitting upright, manage LE's, & use of draw pad to pivot hips around to EOB as well as scoot closer to edge.  Cues for lateral weight shifting to unweigh hips L<>R with scooting closer to EOB.  (A) to align shoulders/trunk over BOS (hips) while sitting.   Transfers Transfers: Sit to Stand;Stand to Sit;Stand Pivot Transfers Sit to Stand: 1: +2 Total assist;From bed;With armrests;From chair/3-in-1;With upper extremity assist Sit to Stand: Patient Percentage: 50% (30% from recliner) Stand to Sit: 1: +2 Total assist;With upper extremity assist;With armrests;To chair/3-in-1 Stand to Sit: Patient Percentage: 0% Stand Pivot Transfers: 1:  +2 Total assist Stand Pivot Transfers: Patient Percentage: 30% Details for Transfer Assistance: Increased (A) to stand from recliner.  From recliner, pt not extending trunk & hips.  With SPT bed>recliner pt still unable to move LE's but pivoted with increased ease compared to previous PT session.  Faciliation at hips to promote increased upright posture with (A) to stand, balance, rotation of hips with transfer, & control descent.  Cues for sequencing, technique Ambulation/Gait Ambulation/Gait Assistance: Not tested (comment)    Exercises      PT Goals Acute Rehab PT Goals Time For Goal Achievement: 12/03/12 Potential to Achieve Goals: Good Pt will Roll Supine to Right Side: with rail;with supervision PT Goal: Rolling Supine to Right Side - Progress: Not met Pt will Roll Supine to Left Side: with min assist;with rail PT Goal: Rolling Supine to Left Side - Progress: Not met Pt will go Supine/Side to Sit: with min assist PT Goal: Supine/Side to Sit - Progress: Not met Pt will Sit at [facility] of Bed: with modified independence;with no upper extremity support Pt will go Sit to Supine/Side: with mod assist Pt will go Sit to Stand: with upper extremity assist;with mod assist PT Goal: Sit to Stand - Progress: Not met Pt will go Stand to Sit: with min assist;with upper extremity assist PT Goal: Stand to Sit - Progress: Not met Pt will Stand: with min assist;1 - 2 min;with unilateral upper extremity support Pt will Ambulate: 16 - 50 feet;with least restrictive assistive device;with mod assist  Visit Information  Last PT Received On: 11/23/12 Assistance Needed: +2    Subjective Data  Subjective: "Give me some time", with all transitional movements.  Cognition  Cognition Overall Cognitive Status: Impaired Area of Impairment: Problem solving;Executive functioning;Memory Arousal/Alertness: Awake/alert Orientation Level: Disoriented to;Place;Time;Situation Behavior During Session: Flat  affect Attention - Other Comments: has difficulty focusing long due to frustration and fatigue as well as distracted by her own deficits Memory Deficits: Pt unable to recall reason for admittance to hospital until she was given cues.   Following Commands: Follows one step commands with increased time;Follows multi-step commands inconsistently Awareness of Errors: Assistance required to correct errors made;Assistance required to identify errors made Awareness of Errors - Other Comments: Pt reports she's unaware of leaning to Rt sitting EOB.   Problem Solving: requires step-by-step directional cues for tasks.   Cognition - Other Comments: When asking where she was she was able to tell me "[place]", but unable to tell me she was [facility], she also kept repeating "[place]" even after being told she was at the hospital.      Balance     End of Session PT - End of Session Equipment Utilized During Treatment: Gait belt Activity Tolerance: Patient limited by fatigue Patient left: in chair;with call bell/phone within reach Nurse Communication: Mobility status;Need for lift equipment     Verdell Face, [place] [phone] 11/23/2012

## 2012-11-23 NOTE — Progress Notes (Signed)
Speech Language Pathology Treatment Patient Details Name: [name] MRN: [id number] DOB: Dec 23, 1945 Today's Date: 11/23/2012 Time: 8295-6213 SLP Time Calculation (min): 35 min  Assessment / Plan / Recommendation Clinical Impression  pt able to answer biographical questions with 90% accuracy.  difficulty noted with birth month, date, sister's name (May).  She follows verbal directions with 90% accuracy.  Naming functional objects in room is significantly impaired, with perseveration and inability to verbalize correct name.  Pt frequently states, "It takes me awhile" or "I'm trying to think of the name".  Nursing reports improvement in functional communication re: wants and needs.    SLP Plan  Continue with current plan of care    Pertinent Vitals/Pain No pain reported  SLP Goals  SLP Goals SLP Goal #1 - Progress: Met SLP Goal #2 - Progress: Met SLP Goal #3 - Progress: Progressing toward goal  General Temperature Spikes Noted: No Respiratory Status: Room air Behavior/Cognition: Alert;Cooperative;Requires cueing Patient Positioning: Upright in chair  Oral Cavity - Oral Hygiene Brush patient's teeth BID with toothbrush (using toothpaste with fluoride): Yes   Treatment Treatment focused on: Aphasia Skilled Treatment: Treatment focused on auditory comprehension and verbal expression tasks of direction following, answer biographical questions, and functional naming.  [name] B. Murvin Natal [facility], CCC-SLP [phone]  [name] 11/23/2012, 12:25 PM

## 2012-11-23 NOTE — Progress Notes (Signed)
[facility] Progress Note   [name] ZOX:[id number] DOB: 1945/11/02 DOA: 11/17/2012 PCP: Astrid Divine, MD  Brief narrative: 67 y.o. female with history of diabetes mellitus type 2 and hypertension started experiencing headache last evening around 9 PM. It was mostly frontal and severe. EMS was called and patient was brought to the ER. Patient's blood pressure was found to be very high. In addition patient was found to be tachycardic and EKG showed patient is in A. fib with RVR which was new for her. CT of the head was negative for anything acute. Patient was nonfocal and denied any blurred vision, difficulty speaking or swallowing or any weakness of the upper lower extremities. Patient was found to be mildly confused initially. At time of admission the patient had been started on Cardizem infusion and has been admitted for further management. Patient denied any chest pain, shortness of breath, nausea/ vomiting or abdominal pain. Patient stated her blood pressure has not been well controlled recently and states that she has been compliant with her medications.  First 24 hours after admission patient initially had an appearance consistent with PRES during initial examination on the morning rounds. Subsequently later in the day when attending physician returned to re-evaluate the patient she noticed some subtle focal neurological deficits involving the right arm and ptosis, right facial droop and diplopia. A positive MRI report was called revealing bilateral PCA territory strokes greater on the left. The MRI also revealed left parietal/temporal and left occipital involvement as well. This was associated with cytotoxic edema. There were also areas of chronic micro-hemorrhagic changes in the right cerebellar hemisphere. Neurology was consulted.   On 11/19/2012 patient continued to have symptoms consistent with evolving stroke. She had marked upper right extremity weakness although sensation  remained intact. She was somewhat weaker on the left lower extremity than the right lower extremity. She had marked ptosis of the left eye with right facial drooping. She does have a prominent visual gaze deficit as evidenced by bilateral hemianopsia with left sided visual field preserved and intermittent bouts of diplopia.  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 Pearlean Brownie - we have started Xarelto and an 81 mg ASA per recommendations -Awaiting CIR, possibly in am.    New Onset Atrial fibrillation with RVR -Cont BB- have increased to home dose - Short acting CCB transitioned to long acting 4/13    CKD (chronic kidney disease) stage 3, GFR 30-59 ml/min -Has progressed since 2008  -Because she was given IV contrast  for CTA Head we gave bicarb infusion @ 75/hr x 24 -Bmet did reveal a slight bump in Creatinine which needs to be followed    Diabetes mellitus, controlled -HgbA1c 6.1-given her age and renal function this is an appropriate reading- too tight control in this pt population can lead to recurrent hypoglycemia -would PERMANENTLY discontinue Metformin - would recommend a different agent upon d/c- at this time sugars relatively controlled    Headache - 2/2 high BP and new CVA- better per pt report   DVT prophylaxis: SCDs Code Status: Full Family Communication:  Patient  Disposition Plan: CIR, hopefully in am Isolation: None  Consultants: Neurology  Procedures: 2-D echocardiogram  (4/10) Study Conclusions - Left ventricle: The cavity size was normal. There was mild concentric hypertrophy. Systolic function was normal. The estimated ejection fraction was in the range of 60% to 65%. Wall motion was normal; there were no regional wall motion abnormalities. - Mitral valve: Mild to moderate regurgitation. - Left atrium: The atrium was moderately to severely dilated. - Right atrium: The atrium was dilated. - Pulmonary arteries: Systolic pressure was moderately increased. PA peak pressure: 52mm Hg (S).  Carotid dopplers cannot locate final report in EPIC- preliminary is negative  Antibiotics: None  HPI/Subjective: Patient alert and interactive;aphasia resolved, able to use RUE more easily but still with some neglect   Objective: Blood pressure 138/84, pulse 80, temperature 98.7 F (37.1 C), temperature source Oral, resp. rate 18, height 5' (1.524 m), weight 108.047 kg (238 lb 3.2 oz), SpO2 97.00%.  Intake/Output Summary (Last 24 hours) at 11/23/12 1554 Last data filed at 11/23/12 1421  Gross per 24 hour  Intake    726 ml  Output      0 ml  Net    726 ml     Exam: General: No acute respiratory distress Lungs: Clear to auscultation bilaterally without wheezes or crackles, RA Cardiovascular: Irregular rate and rhythm without murmur gallop or rub normal S1 and S2, no peripheral edema or JVD Abdomen: Nontender, nondistended, soft, bowel sounds positive, no rebound, no ascites, no appreciable mass Musculoskeletal: No significant cyanosis, clubbing of bilateral lower extremities Neurological: Alert and oriented x 3, moves RUE on command and able to lift over her head but still weaker than left; has improving left ptosis and right facial droop, no dysarthria and improved mixed aphasia   Data Reviewed: Basic Metabolic Panel:  Recent Labs Lab 11/20/12 0630 11/21/12 0635 11/21/12 0840 11/22/12 0530 11/23/12 0500  NA 138 137 138 138 137  K 3.6 4.2 3.4* 3.9  4.0  CL 95* 96 97 99 101  CO2 35* 31 31 29 29   GLUCOSE 120* 120* 121* 122* 138*  BUN 28* 28* 28* 28* 25*  CREATININE 1.72* 1.74* 1.72* 1.74* 1.66*  CALCIUM 9.5 9.2 9.5 9.2 9.2   Liver Function Tests:  Recent Labs Lab 11/18/12 0904  AST 17  ALT 19  ALKPHOS 69  BILITOT 0.2*  PROT 8.4*  ALBUMIN 3.7   CBC:  Recent Labs Lab 11/17/12 2347 11/18/12 0011 11/18/12 0904  WBC 9.9  --  8.7  NEUTROABS 6.5  --  7.0  HGB 12.2 13.3 12.2  HCT 36.4 39.0 36.1  MCV 87.1  --  87.0  PLT 348  --  368   Cardiac Enzymes:  Recent Labs Lab 11/18/12 0330  TROPONINI <0.30   CBG:  Recent Labs Lab 11/22/12 1132 11/22/12 1607 11/22/12 2101 11/23/12 0616 11/23/12 1150  GLUCAP 199* 137* 207* 131* 175*    Recent Results (from the past 240 hour(s))  MRSA PCR SCREENING     Status: None   Collection Time    11/18/12  4:54 AM      Result Value Range Status   MRSA by PCR NEGATIVE  NEGATIVE Final   Comment:            The GeneXpert MRSA Assay (FDA     approved for NASAL specimens     only), is one component of a     comprehensive MRSA colonization     surveillance program. It  is not     intended to diagnose MRSA     infection nor to guide or     monitor treatment for     MRSA infections.      11/23/2012, 3:54 PM   LOS: 6 days   Peggye Pitt, MD [facility] Pager: [phone]

## 2012-11-23 NOTE — Progress Notes (Signed)
Rehab admissions - Evaluated for possible admission.  I spoke with patient, sister, and brother-in-law at the bedside.  Sister prefers inpatient rehab admission.  I have called and faxed information to insurance case manager.  Will await response from insurance.  I do not expect to get an insurance decision until tomorrow regarding possible inpatient rehab admission.  Call me for questions.  #[phone]

## 2012-11-23 NOTE — Progress Notes (Signed)
Consult follow up  Pt is more alert today.  Word finding deficits present.  Emotional lability  Motor 3-/5 RUE adn RLE, 5/5 on Left  Oriented to person, able to select hospital correctly from choices.  A/P 1. L PCA infarct with aphasia, R hemiparesis.  Cognition improved to the point that she is appropriate for CIR.

## 2012-11-23 NOTE — Clinical Documentation Improvement (Signed)
GENERIC DOCUMENTATION CLARIFICATION QUERY  CLINICAL DOCUMENTATION QUERIES ARE NOT PART OF THE PERMANENT MEDICAL RECORD  Please update your documentation within the medical record to reflect your response to this query.                                                                                         11/23/12   Dr. Benjamine Mola and/or Associates,  In a better effort to capture your patient's severity of illness, reflect appropriate length of stay and utilization of resources, a review of the patient medical record has revealed the following indicators:  "Cytogenic Edema" documented in the hospitalist's progress notes.  "...able to use RUE more easwily but still with some neglect" in hospitalist's progress note 11/24/14.   Based on your clinical judgment, please document in the progress notes and discharge summary if a condition below provides greater specificity regarding the "cytogenic edema" and "neglect":   - Brain Edema   - Cerebral Edema   - Other Condition   - Unable to Clinically Determine  ___________________________________________________________________________________   - Neurological Neglect   - Other Condition   - Unable to Clinically Determine    In responding to this query please exercise your independent judgment.    The fact that a query is asked, does not imply that any particular answer is desired or expected.   Reviewed:  no additional documentation provided-- not my note- please send to person who wrote this  Thank You,  Jerral Ralph  RN BSN CCDS Certified Clinical Documentation Specialist: Cell   [phone]  Health Information Management [place]  TO RESPOND TO THE THIS QUERY, FOLLOW THE INSTRUCTIONS BELOW:  1. If needed, update documentation for the patient's encounter via the notes activity.  2. Access this query again and click edit on the In [employer].  3. After updating, or not, click F2 to complete all highlighted  (required) fields concerning your review. Select "additional documentation in the medical record" OR "no additional documentation provided".  4. Click Sign note button.  5. The deficiency will fall out of your In Basket *Please let us know if you are not able to complete this workflow by phone or e-mail (listed below).

## 2012-11-24 ENCOUNTER — Inpatient Hospital Stay (HOSPITAL_COMMUNITY)
Admission: RE | Admit: 2012-11-24 | Discharge: 2012-12-15 | DRG: 945 | Disposition: A | Discharge status: Skilled Nursing Facility | Payer: PRIVATE HEALTH INSURANCE | Source: Intra-hospital | Attending: Physical Medicine & Rehabilitation | Admitting: Physical Medicine & Rehabilitation

## 2012-11-24 DIAGNOSIS — R4701 Aphasia: Secondary | ICD-10-CM | POA: Diagnosis not present

## 2012-11-24 DIAGNOSIS — G8190 Hemiplegia, unspecified affecting unspecified side: Secondary | ICD-10-CM | POA: Diagnosis not present

## 2012-11-24 DIAGNOSIS — E1149 Type 2 diabetes mellitus with other diabetic neurological complication: Secondary | ICD-10-CM | POA: Diagnosis not present

## 2012-11-24 DIAGNOSIS — Z7982 Long term (current) use of aspirin: Secondary | ICD-10-CM | POA: Diagnosis not present

## 2012-11-24 DIAGNOSIS — R1310 Dysphagia, unspecified: Secondary | ICD-10-CM

## 2012-11-24 DIAGNOSIS — H269 Unspecified cataract: Secondary | ICD-10-CM

## 2012-11-24 DIAGNOSIS — G8110 Spastic hemiplegia affecting unspecified side: Secondary | ICD-10-CM

## 2012-11-24 DIAGNOSIS — Z8249 Family history of ischemic heart disease and other diseases of the circulatory system: Secondary | ICD-10-CM | POA: Diagnosis not present

## 2012-11-24 DIAGNOSIS — M169 Osteoarthritis of hip, unspecified: Secondary | ICD-10-CM | POA: Diagnosis not present

## 2012-11-24 DIAGNOSIS — Z5189 Encounter for other specified aftercare: Principal | ICD-10-CM

## 2012-11-24 DIAGNOSIS — I10 Essential (primary) hypertension: Secondary | ICD-10-CM | POA: Diagnosis not present

## 2012-11-24 DIAGNOSIS — R32 Unspecified urinary incontinence: Secondary | ICD-10-CM

## 2012-11-24 DIAGNOSIS — Z823 Family history of stroke: Secondary | ICD-10-CM

## 2012-11-24 DIAGNOSIS — M1610 Unilateral primary osteoarthritis, unspecified hip: Secondary | ICD-10-CM

## 2012-11-24 DIAGNOSIS — E1142 Type 2 diabetes mellitus with diabetic polyneuropathy: Secondary | ICD-10-CM

## 2012-11-24 DIAGNOSIS — I639 Cerebral infarction, unspecified: Secondary | ICD-10-CM

## 2012-11-24 DIAGNOSIS — K5900 Constipation, unspecified: Secondary | ICD-10-CM

## 2012-11-24 DIAGNOSIS — Z7901 Long term (current) use of anticoagulants: Secondary | ICD-10-CM | POA: Diagnosis not present

## 2012-11-24 DIAGNOSIS — F4321 Adjustment disorder with depressed mood: Secondary | ICD-10-CM

## 2012-11-24 DIAGNOSIS — I6340 Cerebral infarction due to embolism of unspecified cerebral artery: Secondary | ICD-10-CM

## 2012-11-24 DIAGNOSIS — Z79899 Other long term (current) drug therapy: Secondary | ICD-10-CM | POA: Diagnosis not present

## 2012-11-24 DIAGNOSIS — H409 Unspecified glaucoma: Secondary | ICD-10-CM | POA: Diagnosis not present

## 2012-11-24 DIAGNOSIS — H53469 Homonymous bilateral field defects, unspecified side: Secondary | ICD-10-CM | POA: Diagnosis not present

## 2012-11-24 DIAGNOSIS — Z888 Allergy status to other drugs, medicaments and biological substances status: Secondary | ICD-10-CM | POA: Diagnosis not present

## 2012-11-24 DIAGNOSIS — I4891 Unspecified atrial fibrillation: Secondary | ICD-10-CM

## 2012-11-24 DIAGNOSIS — Z833 Family history of diabetes mellitus: Secondary | ICD-10-CM | POA: Diagnosis not present

## 2012-11-24 DIAGNOSIS — R471 Dysarthria and anarthria: Secondary | ICD-10-CM | POA: Diagnosis not present

## 2012-11-24 DIAGNOSIS — I69921 Dysphasia following unspecified cerebrovascular disease: Secondary | ICD-10-CM

## 2012-11-24 LAB — GLUCOSE, CAPILLARY
Glucose-Capillary: 139 mg/dL — ABNORMAL HIGH (ref 70–99)
Glucose-Capillary: 146 mg/dL — ABNORMAL HIGH (ref 70–99)
Glucose-Capillary: 226 mg/dL — ABNORMAL HIGH (ref 70–99)

## 2012-11-24 MED ORDER — ACETAMINOPHEN 325 MG PO TABS
325.0000 mg | ORAL_TABLET | ORAL | Status: DC | PRN
  Administered 2012-11-25 – 2012-12-14 (×9): 650 mg via ORAL
  Filled 2012-11-24 (×9): qty 2

## 2012-11-24 MED ORDER — RIVAROXABAN 15 MG PO TABS: 15.0000 mg | ORAL_TABLET | Freq: Every day | ORAL | Status: AC

## 2012-11-24 MED ORDER — METOPROLOL SUCCINATE ER 50 MG PO TB24
50.0000 mg | ORAL_TABLET | Freq: Every day | ORAL | Status: DC
  Administered 2012-11-25: 50 mg via ORAL
  Filled 2012-11-24 (×3): qty 1

## 2012-11-24 MED ORDER — DILTIAZEM HCL ER COATED BEADS 240 MG PO CP24
240.0000 mg | ORAL_CAPSULE | Freq: Every day | ORAL | Status: DC
  Administered 2012-11-25 – 2012-11-28 (×4): 240 mg via ORAL
  Filled 2012-11-24 (×5): qty 1

## 2012-11-24 MED ORDER — RIVAROXABAN 15 MG PO TABS
15.0000 mg | ORAL_TABLET | Freq: Every day | ORAL | Status: DC
  Administered 2012-11-24 – 2012-12-14 (×21): 15 mg via ORAL
  Filled 2012-11-24 (×24): qty 1

## 2012-11-24 MED ORDER — SORBITOL 70 % SOLN
30.0000 mL | Freq: Every day | Status: DC | PRN
  Administered 2012-11-24 – 2012-12-13 (×4): 30 mL via ORAL
  Filled 2012-11-24 (×4): qty 30

## 2012-11-24 MED ORDER — ASPIRIN 81 MG PO CHEW
81.0000 mg | CHEWABLE_TABLET | Freq: Every day | ORAL | Status: DC
  Administered 2012-11-25 – 2012-12-15 (×21): 81 mg via ORAL
  Filled 2012-11-24 (×21): qty 1

## 2012-11-24 MED ORDER — INSULIN ASPART 100 UNIT/ML ~~LOC~~ SOLN: 0.0000 [IU] | Freq: Three times a day (TID) | SUBCUTANEOUS | Status: DC

## 2012-11-24 MED ORDER — INSULIN ASPART 100 UNIT/ML ~~LOC~~ SOLN
0.0000 [IU] | Freq: Three times a day (TID) | SUBCUTANEOUS | Status: DC
  Administered 2012-11-24 – 2012-11-25 (×2): 1 [IU] via SUBCUTANEOUS
  Administered 2012-11-25 (×2): 2 [IU] via SUBCUTANEOUS
  Administered 2012-11-26: 1 [IU] via SUBCUTANEOUS
  Administered 2012-11-26 – 2012-11-27 (×3): 2 [IU] via SUBCUTANEOUS
  Administered 2012-11-27: 1 [IU] via SUBCUTANEOUS
  Administered 2012-11-27 – 2012-11-28 (×2): 2 [IU] via SUBCUTANEOUS
  Administered 2012-11-28 – 2012-11-29 (×4): 1 [IU] via SUBCUTANEOUS
  Administered 2012-11-29: 3 [IU] via SUBCUTANEOUS
  Administered 2012-11-30 (×3): 2 [IU] via SUBCUTANEOUS
  Administered 2012-12-01: 1 [IU] via SUBCUTANEOUS
  Administered 2012-12-01 – 2012-12-02 (×3): 2 [IU] via SUBCUTANEOUS
  Administered 2012-12-02: 3 [IU] via SUBCUTANEOUS
  Administered 2012-12-02 – 2012-12-03 (×2): 1 [IU] via SUBCUTANEOUS
  Administered 2012-12-03 – 2012-12-04 (×4): 2 [IU] via SUBCUTANEOUS
  Administered 2012-12-05 – 2012-12-06 (×4): 1 [IU] via SUBCUTANEOUS
  Administered 2012-12-06 – 2012-12-07 (×3): 2 [IU] via SUBCUTANEOUS
  Administered 2012-12-07: 1 [IU] via SUBCUTANEOUS
  Administered 2012-12-08: 2 [IU] via SUBCUTANEOUS
  Administered 2012-12-08 – 2012-12-10 (×6): 1 [IU] via SUBCUTANEOUS
  Administered 2012-12-10 – 2012-12-11 (×3): 2 [IU] via SUBCUTANEOUS
  Administered 2012-12-11 – 2012-12-12 (×2): 1 [IU] via SUBCUTANEOUS
  Administered 2012-12-12: 2 [IU] via SUBCUTANEOUS
  Administered 2012-12-12: 1 [IU] via SUBCUTANEOUS
  Administered 2012-12-13 – 2012-12-14 (×3): 2 [IU] via SUBCUTANEOUS
  Administered 2012-12-14: 1 [IU] via SUBCUTANEOUS
  Administered 2012-12-14: 3 [IU] via SUBCUTANEOUS
  Administered 2012-12-15: 2 [IU] via SUBCUTANEOUS

## 2012-11-24 MED ORDER — DILTIAZEM HCL ER COATED BEADS 240 MG PO CP24: 240.0000 mg | ORAL_CAPSULE | Freq: Every day | ORAL | Status: DC

## 2012-11-24 MED ORDER — ASPIRIN 81 MG PO CHEW: 81.0000 mg | CHEWABLE_TABLET | Freq: Every day | ORAL | Status: DC

## 2012-11-24 MED ORDER — ONDANSETRON HCL 4 MG PO TABS
4.0000 mg | ORAL_TABLET | Freq: Four times a day (QID) | ORAL | Status: DC | PRN
  Administered 2012-12-13: 4 mg via ORAL
  Filled 2012-11-24: qty 1

## 2012-11-24 MED ORDER — ONDANSETRON HCL 4 MG/2ML IJ SOLN: 4.0000 mg | Freq: Four times a day (QID) | INTRAMUSCULAR | Status: DC | PRN

## 2012-11-24 NOTE — Progress Notes (Addendum)
Occupational Therapy Treatment Patient Details Name: [name] MRN: [id number] DOB: 06-23-1946 Today's Date: 11/24/2012 Time: 1478-2956 OT Time Calculation (min): 29 min  OT Assessment / Plan / Recommendation Comments on Treatment Session This 67 yo female admitted with HA, HTN, and A15fib with RVR and then showing signs of stroke with MRI showing a large left PCA and a small rigth PCA infarct presenting to OT today with some progress with overall mobility. Will continue to benefit from acute OT with follow up on CIR.    Follow Up Recommendations  CIR             Frequency Min 2X/week   Plan Discharge plan remains appropriate    Precautions / Restrictions Precautions Precautions: Fall Restrictions Weight Bearing Restrictions: No Other Position/Activity Restrictions: Per sister in room, pt had a bad right hip before this and was to have surgery, this is really painful to her now       ADL  Toilet Transfer: Simulated;+2 Total assistance (with Huntley Dec plus from bed to recliner) Transfers/Ambulation Related to ADLs: Stood times 2 with use of Sara Plus (did better the second time than the first time) both in being more uprigtht and time in standing. VCs need to hold head up, grip hand grips better, and bring chest away from platform ADL Comments: Did work minimally on finding items on her breakfast tray and self feeding. Finding items max Vc's and self feeding mod A. Sister there to A for rest prn with encouragement to let the paitient try and do as much for herself as possible.     OT Goals Miscellaneous OT Goals OT Goal: Miscellaneous Goal #1 - Progress: Progressing toward goals OT Goal: Miscellaneous Goal #2 - Progress: Progressing toward goals  Visit Information  Last OT Received On: 11/24/12 Assistance Needed: +2 PT/OT Co-Evaluation/Treatment: Yes    Subjective Data  Subjective: "I ready to get up and run home"  (after we got her up to the recliner with the [employer]  Cognition Arousal/Alertness: Awake/alert Behavior During Therapy: WFL for tasks assessed/performed Overall Cognitive Status: Impaired/Different from baseline Area of Impairment: Attention;Following commands;Awareness Current Attention Level: Sustained Following Commands: Follows one step commands inconsistently;Follows one step commands with increased time General Comments: Pt with Rt inattention.  Has difficulty deciphering between R & L with tasks.      Mobility  Bed Mobility Bed Mobility: Rolling Right;Right Sidelying to Sit;Sitting - Scoot to Edge of Bed Rolling Right: 3: Mod assist;With rail Right Sidelying to Sit: 1: +2 Total assist;HOB flat;With rails Right Sidelying to Sit: Patient Percentage: 30% Sitting - Scoot to Edge of Bed: 2: Max assist Details for Bed Mobility Assistance: Pt requires significant (A) to intiate side>sit but able to complete from midpoint point with mod (A) & increased time to complete bringing torso & distributing weight through hips to sit upright .   Transfers Transfers: Sit to Stand;Stand to Sit Sit to Stand: 1: +2 Total assist;From bed Stand to Sit: 1: +2 Total assist;To chair/3-in-1;To bed Transfer via Lift Equipment: Kandee Keen Details for Transfer Assistance: Peter Congo plus to perform sit<>stand  x 2& Stand-pivot transfer today.   (A) + faciliation for increased trunk & hip extension & to laterally weight shift & to evenly distribute weight through LE's.      Exercises  Other Exercises Other Exercises: Truncal muscle activation activity with use of UE's to (A) with translating torso forwards 2x's + holding 20 secs.  Balance Static Sitting Balance Static Sitting - Balance Support: Feet supported;No upper extremity supported Static Sitting - Level of Assistance:  (min guard A) Static Sitting - Comment/# of Minutes: Cues for midline posture in sitting--sat EOB 5 minutes Static Standing Balance Static Standing - Balance Support:  Bilateral upper extremity supported Static Standing - Level of Assistance: Other (comment) Static Standing - Comment/# of Minutes: Facilitation to promote trunk and hip extension for tall posture, weight shifting L<>R & to evenly distribute weight over base of support, as well as work on midline standing.   End of Session OT - End of Session Equipment Utilized During Treatment: Gait belt Activity Tolerance: Patient limited by fatigue Patient left: in chair;with family/visitor present Nurse Communication: Mobility status;Need for lift equipment       Evette Georges [phone] 11/24/2012, 12:16 PM

## 2012-11-24 NOTE — Plan of Care (Signed)
Problem: Phase II Progression Outcomes Goal: Tolerates increased mobility Outcome: Progressing Pt up to chair using lift with PT today, pt back to bed with lift. Pt tolerated well.

## 2012-11-24 NOTE — Clinical Social Work Psychosocial (Addendum)
    Clinical Social Work Department BRIEF PSYCHOSOCIAL ASSESSMENT 11/24/2012  Patient:  [name], [name]     Account Number:  [id number]     Admit date:  11/17/2012  Clinical Social Worker:  Tiburcio Pea  Date/Time:  11/23/2012 01:00 PM  Referred by:  Physician  Date Referred:  11/22/2012 Referred for  SNF Placement   Other Referral:   Back up to CIR plan   Interview type:  Patient Other interview type:    PSYCHOSOCIAL DATA Living Status:  FAMILY Admitted from facility:   Level of care:   Primary support name:  Andres Shad   Daughter Primary support relationship to patient:  CHILD, ADULT Degree of support available:   Good support    CURRENT CONCERNS Current Concerns  Post-Acute Placement   Other Concerns:   awaiting CIR offer vs SNF    SOCIAL WORK ASSESSMENT / PLAN CSW met with patient today to discuss d/c disposition. She has been evaluated and accepted by CIR pending [employer] authorization.  Discussed with patient the need for a "back up" plan should the insurance not authorize CIR;  patient has had a stroke and will require intensive therapy. Discussed benefits of short term SNF if unable to go to CIR and she agreed to bed search.  CSW will monitor.  Fl2 placed on shadow chart for MD's signature. Patient states that she lives with her sister and plans to return there after rehab.   Assessment/plan status:  Psychosocial Support/Ongoing Assessment of Needs Other assessment/ plan:   Information/referral to community resources:   SNF bed list provided    PATIENT'S/FAMILY'S RESPONSE TO PLAN OF CARE: Patient is alert and oriented; she is agreeable to SNF plan as back up only for rehab. She is very hopeful to go to CIR.  Patient was very pleasant and appreciative of CSW' s intervention. Stated "I want to get better and go home." CSW will monitor.

## 2012-11-24 NOTE — Plan of Care (Signed)
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  Diabetes mellitus  .  Glaucoma  .  Cataract    Functional Problem List  Patient demonstrates impairments in the following areas: Balance, Bladder, Bowel, Cognition, Endurance, Motor, Pain, Perception, Safety, Sensory  and Vision  Basic ADL's: eating, grooming, bathing, dressing and toileting Advanced ADL's: n/a at this time  Transfers:  bed mobility, bed to chair, toilet, car and furniture Locomotion:  ambulation and wheelchair mobility  Additional Impairments:  Functional use of right upper extremity, Communication  comprehension and expression and Social Cognition   problem solving and awareness  Anticipated Outcomes Item Anticipated Outcome  Eating/Swallowing  Mod I  Basic self-care  Mod Assist  Tolieting  Mod Assist  Bowel/Bladder  Manage bowel/bladder with briefs and timed toileting  Transfers  Basic with min assist; toilet with min assist; car with mod assist  Locomotion  W/c x 50' in controlled env x 25' in home env with min assist; gait x 25' in controlled and home env with mod assist  Communication    Cognition    Pain  </=2  Safety/Judgment  No falls with injury  Other     Therapy Plan: PT Intensity: Minimum of 1-2 x/day ,45 to 90 minutes PT Frequency: 5 out of 7 days PT Duration Estimated Length of Stay: 3.5 weeks; OT Frequency: 5 out of 7 days OT Duration/Estimated Length of Stay: 3-4 weeks SLP Intensity: Minumum of 1-2 x/day, 30 to 90 minutes SLP Frequency: 5 out of 7 days SLP Duration/Estimated Length of Stay: 3.5 weeks    Team Interventions: Item RN PT OT SLP SW TR Other  Self Care/Advanced ADL Retraining   x      Neuromuscular Re-Education  x x      Therapeutic Activities  x x x     UE/LE Strength Training/ROM  x x       UE/LE Coordination Activities  x x      Visual/Perceptual Remediation/Compensation  x x      DME/Adaptive Equipment Instruction  x x      Therapeutic Exercise  x x      Balance/Vestibular Training  x x      Patient/Family Education x x x x     Cognitive Remediation/Compensation  x x x     Functional Mobility Training  x x      Ambulation/Gait Training  x       Stair Training  x       Wheelchair Propulsion/Positioning  xx x      Functional Nurse, learning disability Facilitation    x     Bladder Management x        Bowel Management x        Disease Management/Prevention x        Pain Management x  x      Medication Management x        Skin Care/Wound Management x        Splinting/Orthotics  x       Discharge Planning x x x x     Psychosocial Support x x x  Team Discharge Planning: Destination: PT-Home ,OT- Home , SLP-Home Projected Follow-up: PT-Home health PT, OT-  Home health OT, SLP-Outpatient SLP;24 hour supervision/assistance Projected Equipment Needs: PT-Wheelchair (measurements);Wheelchair cushion (measurements);Rolling walker with 5" wheels, OT- 3 in 1 bedside comode, SLP-None recommended by SLP Patient/family involved in discharge planning: PT- Patient;Family member/caregiver,  OT-Patient;Family member/caregiver, SLP-Patient  MD ELOS: every week Medical Rehab Prognosis:  Good Assessment: 67 year old female with large left PCA infarct now requiring 24 7 rehabilitation RN and M.D. As well as CIR level PT OT and SLP. Treatment team will focus on mobility ADLs aphasia field cut. Goals are for supervision to min assist levelwith mobility and min mod assist level with ADLs    See Team Conference Notes for weekly updates to the plan of care

## 2012-11-24 NOTE — H&P (Signed)
Chief Complaint   Patient presents with   .  Right-sided weakness  :  HPI: [name] is a 67 y.o. right-handed female with history of hypertension, diabetes mellitus with peripheral neuropathy. Admitted 11/18/2012 with headache and elevated blood pressure 172/127. EKG showed atrial fibrillation with RVR. 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 Echocardiogram with ejection fraction of 65% and no wall motion abnormalities. Carotid Dopplers with no ICA stenosis. Patient did not receive TPA. Started on Cardizem infusion for atrial fibrillation and transition to by mouth. 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 Neurology services consulted maintained on aspirin therapy as well as Xarelto. Physical and occupational therapy evaluations completed 11/19/2012 with recommendations of physical medicine rehabilitation consult to consider inpatient rehabilitation services. Patient was felt to be a good candidate for inpatient rehabilitation services and was admitted for comprehensive rehabilitation program   Patient with word finding problems. Able to answer yes no questions as well as give basic biographical information. No pain complaints.  Review of Systems  Gastrointestinal: Positive for constipation.  Neurological: Positive for dizziness and headaches.  All other systems reviewed and are negative  Past Medical History   Diagnosis  Date   .  Hypertension    .  Diabetes mellitus    .  Glaucoma    .  Cataract     History reviewed. No pertinent past surgical history.  Family History   Problem  Relation  Age of Onset   .  Stroke  Mother    .  Diabetes type II  Mother    .  CAD  Father     Social History: reports that she has never smoked. She does not have any smokeless tobacco history on file. She reports that she does not drink alcohol or use illicit drugs.  Allergies: No Known Allergies   Medications Prior to Admission   Medication  Sig  Dispense  Refill   .  amLODipine-benazepril (LOTREL) 10-20 MG per capsule  Take 1 capsule by mouth daily.     .  hydrochlorothiazide (HYDRODIURIL) 25 MG tablet  Take 25 mg by mouth daily.     .  metFORMIN (GLUCOPHAGE) 500 MG tablet  Take 500 mg by mouth 2 (two) times daily with a meal.     .  metoprolol succinate (TOPROL-XL) 50 MG 24 hr tablet  Take 50 mg by mouth daily. Take with or immediately following a meal.      Home:  Home Living  Lives With: Daughter  Available Help at Discharge: Available PRN/intermittently;Family  Type of Home: House  Home Layout: One level  Functional History:  Prior Function  Driving: Yes  Comments: unsure due to pt wax/wane cognition and ability to communicat (question cane or walker for ambulation prior due to some hip pain)  Functional Status:  Mobility:  Bed Mobility  Bed Mobility: Rolling Right;Rolling Left  Rolling Right: 3: Mod assist;With rail  Rolling Left: 2: Max assist;With rail  Right Sidelying to Sit: 1: +2 Total assist;With rails  Right Sidelying to Sit: Patient Percentage: 20%  Supine to Sit: HOB elevated;3: Mod assist;With rails  Sitting - Scoot to Edge of Bed: 1: +1 Total assist  Sitting - Scoot to Edge of Bed: Patient Percentage: 0%  Sit to Supine: HOB flat;1: +2 Total assist  Sit to Supine: Patient Percentage: 10%  Transfers  Transfers: Sit to Stand;Stand to [employer]  to Stand: 1: +2 Total assist;From bed;With armrests;From chair/3-in-1;With upper extremity assist  Sit to Stand: Patient Percentage: 50% (30% from recliner)  Stand to Sit: 1: +2 Total assist;With upper extremity assist;With armrests;To chair/3-in-1  Stand to Sit: Patient Percentage: 0%  Stand Pivot Transfers: 1: +2 Total assist  Stand Pivot Transfers: Patient Percentage: 30%  Ambulation/Gait  Ambulation/Gait Assistance: Not tested (comment)   ADL:  ADL  Eating/Feeding: Simulated;Minimal  assistance (with max cues)  Where Assessed - Eating/Feeding: Bed level  Grooming: Simulated;Minimal assistance (with max cues)  Where Assessed - Grooming: Supine, head of bed up  Upper Body Bathing: Simulated;Moderate assistance (with max cues)  Where Assessed - Upper Body Bathing: Supine, head of bed up;Supine, head of bed flat;Rolling right and/or left  Lower Body Bathing: Simulated;+1 Total assistance (with max cues)  Where Assessed - Lower Body Bathing: Supine, head of bed up;Supine, head of bed flat;Rolling right and/or left  Upper Body Dressing: Simulated;+1 Total assistance (with max cues)  Where Assessed - Upper Body Dressing: Supine, head of bed up;Supine, head of bed flat;Rolling right and/or left  Lower Body Dressing: Simulated;+1 Total assistance (with max cues)  Where Assessed - Lower Body Dressing: Supine, head of bed up;Supine, head of bed flat;Rolling right and/or left  Toilet Transfer: Simulated;+2 Total assistance  Toilet Transfer Method: Stand pivot  Toilet Transfer Equipment: (raised be to recliner going to pt's left)  Equipment Used: Gait belt  Transfers/Ambulation Related to ADLs: Pt total A +2 sit to stand, stand to sit 50%; ambulation---pt unable at present  Cognition:  Cognition  Overall Cognitive Status: Other (comment) (to be assessed as language allows)  Arousal/Alertness: Awake/alert  Orientation Level: Oriented X4  Cognition  Arousal/Alertness: Awake/alert  Behavior During Therapy: Flat affect  Overall Cognitive Status: Other (comment) (to be assessed as language allows)  Area of Impairment: Problem solving;Executive functioning;Memory  Orientation Level: Disoriented to;Place;Time;Situation  Current Attention Level: Sustained  Following Commands: Follows one step commands with increased time;Follows multi-step commands inconsistently  Safety/Judgement: Decreased safety judgement for tasks assessed;Decreased awareness of need for assistance  General  Comments: When asking where she was she was able to tell me "[place]", but unable to tell me she was [facility], she also kept repeating "[place]" even after being told she was at the hospital.  Physical Exam:  Blood pressure 183/109, pulse 91, temperature 97.9 F (36.6 C), temperature source Oral, resp. rate 20, height 5' (1.524 m), weight 108.7 kg (239 lb 10.2 oz), SpO2 96.00%.  Physical Exam  Vitals reviewed.  Eyes:  Pupils reactive to light  Neck: Neck supple. No thyromegaly present.  Cardiovascular:  Cardiac rate controlled  Pulmonary/Chest: Effort normal and breath sounds normal.  Abdominal: Bowel sounds are normal. She exhibits no distension.  Neurological: She is alert.  Patient is appropriate to basic conversation during exam. She followed simple commands. She was able to provide her name and age. Noted left gaze preference.  Alert.Oriented to self only,Receptive language deficits noted  Dense right homonymous hemianopsia with poor compensation  Motor strength 3 minus in the right deltoid, biceps, triceps, grip, hip flexor knee extensor ankle dorsiflexor plantar flexor  4/5 on the left side  Sensory cannot assess secondary to language deficit Results for orders placed during the hospital encounter of 11/17/12 (from the past 48 hour(s))   GLUCOSE, CAPILLARY Status: Abnormal    Collection Time    11/22/12 11:32 AM   Result  Value  Range    Glucose-Capillary  199 (*)  70 - 99 mg/dL    Comment 1  Notify RN    GLUCOSE, CAPILLARY Status: Abnormal    Collection Time    11/22/12 4:07 PM   Result  Value  Range    Glucose-Capillary  137 (*)  70 - 99 mg/dL    Comment 1  Notify RN    GLUCOSE, CAPILLARY Status: Abnormal    Collection Time    11/22/12 9:01 PM   Result  Value  Range    Glucose-Capillary  207 (*)  70 - 99 mg/dL    Comment 1  Notify RN     Comment 2  Documented in Chart    BASIC METABOLIC PANEL Status: Abnormal    Collection Time    11/23/12 5:00 AM    Result  Value  Range    Sodium  137  135 - 145 mEq/L    Potassium  4.0  3.5 - 5.1 mEq/L    Chloride  101  96 - 112 mEq/L    CO2  29  19 - 32 mEq/L    Glucose, Bld  138 (*)  70 - 99 mg/dL    BUN  25 (*)  6 - 23 mg/dL    Creatinine, Ser  1.61 (*)  0.50 - 1.10 mg/dL    Calcium  9.2  8.4 - 10.5 mg/dL    GFR calc non Af Amer  32 (*)  >90 mL/min    GFR calc Af Amer  37 (*)  >90 mL/min    Comment:      The eGFR has been calculated     using the CKD EPI equation.     This calculation has not been     validated in all clinical     situations.     eGFR's persistently     <90 mL/min signify     possible Chronic Kidney Disease.   GLUCOSE, CAPILLARY Status: Abnormal    Collection Time    11/23/12 6:16 AM   Result  Value  Range    Glucose-Capillary  131 (*)  70 - 99 mg/dL   GLUCOSE, CAPILLARY Status: Abnormal    Collection Time    11/23/12 11:50 AM   Result  Value  Range    Glucose-Capillary  175 (*)  70 - 99 mg/dL   GLUCOSE, CAPILLARY Status: Abnormal    Collection Time    11/24/12 6:00 AM   Result  Value  Range    Glucose-Capillary  146 (*)  70 - 99 mg/dL    Comment 1  Notify RN     No results found.  Post Admission Physician Evaluation:  1. Functional deficits secondary to Large left PCA involved infarct with right hemiparesis, aphasia, right homonymous hemianopsia. 2. Patient is admitted to receive collaborative, interdisciplinary care between the physiatrist, rehab nursing staff, and therapy team. 3. Patient's level of medical complexity and substantial therapy needs in context of that medical necessity cannot be provided at a lesser intensity of care such as a SNF. 4. Patient has experienced substantial functional loss from his/her baseline which was documented above under the "Functional History" and "Functional Status" headings. Judging by the patient's diagnosis, physical exam, and functional history, the patient has potential for functional progress which will result in  measurable gains while on inpatient rehab. These gains will be of substantial and practical use upon discharge in facilitating mobility and self-care at the household level. 5. Physiatrist will provide 24 hour management of medical needs as well  as oversight of the therapy plan/treatment and provide guidance as appropriate regarding the interaction of the two. 6. 24 hour rehab nursing will assist with bladder management, bowel management, safety, skin/wound care, disease management, medication administration and patient education and help integrate therapy concepts, techniques,education, etc. 7. PT will assess and treat for/with: Pre-gait training, gait training, neuromuscular reeducation, endurance, safety, equipment. Goals are: Min assist mobility. 8. OT will assess and treat for/with: ADLs, cognitive perceptual skills, neuromuscular reeducation, safety, endurance, equipment. Goals are: Supervision to mid assist ADLs. 9. SLP will assess and treat for/with: Language, dysarthria, cognition, swallowing. Goals are: Safe by mouth intake. Express basic needs. 10. Case Management and Social Worker will assess and treat for psychological issues and discharge planning. 11. Team conference will be held weekly to assess progress toward goals and to determine barriers to discharge. 12. Patient will receive at least 3 hours of therapy per day at least 5 days per week. 13. ELOS: 3 weeks Prognosis: good Medical Problem List and Plan:  1. Embolic large left PCA infarct  2. DVT Prophylaxis/Anticoagulation: Xarelto.  3. Neuropsych: This patient Is not capable of making decisions on his/her own behalf.  4. New onset atrial fibrillation. Continue Cardizem and Toprol as advised.  5. Diabetes mellitus with peripheral neuropathy. Hemoglobin A1c 6.1. Presently with sliding scale insulin. Check blood sugars a.c. and at bedtime. Patient on Glucophage 500 mg twice a day prior to admission.  6. Hypertension. Patient on Lotrel  10-20 daily prior to admission as well as hydrochlorothiazide 25 mg daily. Will monitor with increased activity and resume as needed  11/24/2012

## 2012-11-24 NOTE — PMR Pre-admission (Signed)
PMR Admission Coordinator Pre-Admission Assessment  Patient: [name] is an 67 y.o., female MRN: [id number] DOB: 10/25/1945 Height: 5' (152.4 cm) Weight: 108.7 kg (239 lb 10.2 oz)              Insurance Information HMO: Yes    PPO:       PCP:       IPA:       80/20:       OTHER:  Group # [id number] PRIMARY: [employer]      Policy#: [id number]      Subscriber: [name] CM Name: Pennelope Bracken      Phone#: [phone]     Fax#:   Pre-Cert#: [id number]      Employer:   Benefits:  Phone #: [phone]     Name: Zonia Kief. Date: 06/11/12     Deduct: $147(met $101.97)      Out of Pocket Max: $6700(met $0)      Life Max: unlimited CIR: $1216      SNF: $0 days 1-20, $152 days 21-100 Outpatient: 80%     Co-Pay: 20%  No limits Home Health: 100%      Co-Pay: none DME: 80%     Co-Pay: 20% Providers: in network  SECONDARY: Medicaid      Policy#: [id number]      Subscriber: [name] CM Name:        Phone#:       Fax#:   Pre-Cert#:        Employer:   Benefits:  Phone #: [phone]     Name:  Automated Eff. Date: Eligible on 11/24/12     Deduct:        Out of Pocket Max:        Life Max:   CIR:        SNF:   Outpatient:       Co-Pay:   Home Health:        Co-Pay:   DME:       Co-Pay:     Emergency Contact Information Contact Information   Name Relation Home Work Mobile   [place] Sister [phone]     [name], [name] Daughter   [phone]   [name] [phone]  [phone]     Current Medical History  Patient Admitting Diagnosis:  Large L PCA infarct  History of Present Illness:  A 67 y.o. right-handed female with history of hypertension, diabetes mellitus with peripheral neuropathy. Admitted 11/18/2012 with headache and elevated blood pressure 172/127. EKG showed atrial fibrillation with RVR. 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 Echocardiogram with ejection fraction of 65% and no wall motion  abnormalities. Carotid Dopplers with no ICA stenosis. Patient did not receive TPA. Started on Cardizem infusion for atrial fibrillation and transition to by mouth. 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 Neurology services consulted maintained on aspirin therapy as well as Xarelto. Physical and occupational therapy evaluations completed 11/19/2012 with recommendations of physical medicine rehabilitation consult to consider inpatient rehabilitation services.       Total: 5=NIH  Past Medical History  Past Medical History  Diagnosis Date  . Hypertension   . Diabetes mellitus   . Glaucoma   . Cataract     Family History  family history includes CAD in her father; Diabetes type II in her mother; and Stroke in her mother.  Prior Rehab/Hospitalizations:  No previous rehab admissions.   Current Medications  Current facility-administered medications:acetaminophen (TYLENOL) suppository 650 mg, 650 mg, Rectal, Q6H PRN, Eduard Clos, MD;  acetaminophen (TYLENOL) tablet 650 mg, 650 mg, Oral, Q6H PRN, Eduard Clos, MD, 650 mg at 11/22/12 1610;  aspirin chewable tablet 81 mg, 81 mg, Oral, Daily, Calvert Cantor, MD, 81 mg at 11/23/12 1007;  diltiazem (CARDIZEM CD) 24 hr capsule 240 mg, 240 mg, Oral, Daily, Calvert Cantor, MD, 240 mg at 11/23/12 1007 hydrALAZINE (APRESOLINE) injection 10 mg, 10 mg, Intravenous, Q4H PRN, Leda Gauze, NP, 10 mg at 11/18/12 [phone];  insulin aspart (novoLOG) injection 0-9 Units, 0-9 Units, Subcutaneous, TID WC, Eduard Clos, MD, 1 Units at 11/24/12 0615;  metoprolol succinate (TOPROL-XL) 24 hr tablet 50 mg, 50 mg, Oral, Q breakfast, Calvert Cantor, MD, 50 mg at 11/24/12 0615 ondansetron (ZOFRAN) injection 4 mg, 4 mg, Intravenous, Q6H PRN, Eduard Clos, MD;  ondansetron (ZOFRAN) tablet 4 mg, 4 mg, Oral, Q6H PRN, Eduard Clos, MD;  Rivaroxaban (XARELTO) tablet TABS 15 mg, 15 mg, Oral, Q supper, Gwenlyn Found Carney,  RPH, 15 mg at 11/23/12 1637;  sodium chloride 0.9 % injection 3 mL, 3 mL, Intravenous, Q12H, Eduard Clos, MD, 3 mL at 11/23/12 2202  Patients Current Diet: Carb Control  Precautions / Restrictions Precautions Precautions: Fall Restrictions Weight Bearing Restrictions: No Other Position/Activity Restrictions: Per sister in room, pt had a bad right hip before this and was to have surgery, this is really painful to her now   Prior Activity Level Community (5-7x/wk): Went out about 4 X a week  Journalist, newspaper / Corporate investment banker Devices/Equipment: None  Prior Functional Level Prior Function Level of Independence: Independent Driving: Yes Comments: unsure due to pt wax/wane cognition and ability to communicat (question cane or walker for ambulation prior due to some hip pain)  Current Functional Level Cognition  Arousal/Alertness: Awake/alert Overall Cognitive Status: Impaired/Different from baseline Overall Cognitive Status: Impaired Current Attention Level: Sustained Attention - Other Comments: has difficulty focusing long due to frustration and fatigue as well as distracted by her own deficits Memory Deficits: Pt unable to recall reason for admittance to hospital until she was given cues.   Orientation Level: Oriented X4 Following Commands: Follows one step commands inconsistently;Follows one step commands with increased time Safety/Judgement: Decreased safety judgement for tasks assessed;Decreased awareness of need for assistance Safety/Judgement - Other Comments: Says she wants to go home today, but not realizing that she is physically unable at this time Awareness of Errors: Assistance required to correct errors made;Assistance required to identify errors made Awareness of Errors - Other Comments: Pt reports she's unaware of leaning to Rt sitting EOB.   General Comments: Pt with Rt inattention.  Has difficulty deciphering between R & L with tasks.       Extremity Assessment (includes Sensation/Coordination)  RUE ROM/Strength/Tone: Deficits RUE ROM/Strength/Tone Deficits: AAROM WFL for all joints; supine: AROM of shoulder flexion to about 60 degrees, extension from this 60 degrees to full with control, elbow flexion and extension full, forearm supination to 45 degrees, full pronation, full wrist extension and flexion, digit flexion and extension full---all movements are slow and take increased time. RUE Sensation: Deficits RUE Sensation Deficits: diminished to light touch RUE Coordination: Deficits  RLE ROM/Strength/Tone: Deficits RLE ROM/Strength/Tone Deficits: AAROM limited stiffness in hip, in supine w/ HOB 50 deg about 60 deg hip flex, 30 deg knee flex, ankle AROM WFL RLE Sensation: Deficits RLE Sensation Deficits: though states left feels diminished compared to right has  decreased awareness with increased time to move leg or attempt to move with supine to sit and did not demonstrate much weight acceptance in standing.    ADLs  Eating/Feeding: Simulated;Minimal assistance (with max cues) Where Assessed - Eating/Feeding: Bed level Grooming: Simulated;Minimal assistance (with max cues) Where Assessed - Grooming: Supine, head of bed up Upper Body Bathing: Simulated;Moderate assistance (with max cues) Where Assessed - Upper Body Bathing: Supine, head of bed up;Supine, head of bed flat;Rolling right and/or left Lower Body Bathing: Simulated;+1 Total assistance (with max cues) Where Assessed - Lower Body Bathing: Supine, head of bed up;Supine, head of bed flat;Rolling right and/or left Upper Body Dressing: Simulated;+1 Total assistance (with max cues) Where Assessed - Upper Body Dressing: Supine, head of bed up;Supine, head of bed flat;Rolling right and/or left Lower Body Dressing: Simulated;+1 Total assistance (with max cues) Where Assessed - Lower Body Dressing: Supine, head of bed up;Supine, head of bed flat;Rolling right and/or  left Toilet Transfer: Simulated;+2 Total assistance (with Huntley Dec plus from bed to recliner) Toilet Transfer: Patient Percentage:  (came up to stand with 50% (+2), pivot 20% (+2)) Toilet Transfer Method: Stand pivot Toilet Transfer Equipment:  (raised be to recliner going to pt's left) Toileting - Clothing Manipulation and Hygiene: Simulated;+1 Total assistance Where Assessed - Toileting Clothing Manipulation and Hygiene: Supine, head of bed flat;Rolling right and/or left;Sit on 3-in-1 or toilet Equipment Used: Gait belt Transfers/Ambulation Related to ADLs: Stood times 2 with use of Sara Plus (did better the second time than the first time) both in being more uprigtht and time in standing. VCs need to hold head up, grip hand grips better, and bring chest away from platform ADL Comments: Did work minimally on finding items on her breakfast tray and self feeding. Finding items max Vc's and self feeding mod A. Sister there to A for rest prn with encouragement to let the paitient try and do as much for herself as possible.    Mobility  Bed Mobility: Rolling Right;Right Sidelying to Sit;Sitting - Scoot to Edge of Bed Rolling Right: 3: Mod assist;With rail Rolling Left: 2: Max assist;With rail Right Sidelying to Sit: 1: +2 Total assist;HOB flat;With rails Right Sidelying to Sit: Patient Percentage: 30% Supine to Sit: HOB elevated;3: Mod assist;With rails Sitting - Scoot to Edge of Bed: 2: Max assist Sitting - Scoot to [place] of Bed: Patient Percentage: 0% Sit to Supine: HOB flat;1: +2 Total assist Sit to Supine: Patient Percentage: 10%    Transfers  Transfers: Sit to Stand;Stand to Sit;Stand Pivot Transfers Sit to Stand: 1: +2 Total assist;From bed Sit to Stand: Patient Percentage: 50% (30% from recliner) Stand to Sit: 1: +2 Total assist;To chair/3-in-1;To bed Stand to Sit: Patient Percentage: 0% Stand Pivot Transfers: 1: +2 Total assist Stand Pivot Transfers: Patient Percentage: 30% Transfer  via Lift Equipment: Human resources officer / Gait / Stairs / Psychologist, prison and probation services  Ambulation/Gait Ambulation/Gait Assistance: Not tested (comment)    Posture / Balance Static Sitting Balance Static Sitting - Balance Support: Feet supported;No upper extremity supported Static Sitting - Level of Assistance:  (min guard A) Static Sitting - Comment/# of Minutes: Cues for midline posture in sitting--sat EOB 5 minutes Dynamic Sitting Balance Dynamic Sitting - Balance Support: Feet supported Dynamic Sitting - Level of Assistance: 5: Stand by assistance Dynamic Sitting - Balance Activities: Reaching for objects Dynamic Sitting - Comments: sitting reaching with left UE assisted due to difficulty understanding commands Static Standing Balance Static Standing - Balance  Support: Bilateral upper extremity supported Static Standing - Level of Assistance: Other (comment) Static Standing - Comment/# of Minutes: Facilitation to promote trunk and hip extension for tall posture, weight shifting L<>R & to evenly distribute weight over base of support, as well as work on midline standing.    Special needs/care consideration BiPAP/CPAP No CPM No Continuous Drip IV No Dialysis No        Life Vest No Oxygen No Special Bed No Trach Size No Wound Vac (area) No       Skin No                            Bowel mgmt: Inc of BM on 11/21/12 Bladder mgmt: Inc of urine Diabetic mgmt Yes    Previous Home Environment Living Arrangements: Spouse/significant other;Parent Lives With: Daughter Available Help at Discharge: Available PRN/intermittently;Family Type of Home: House Home Layout: One level Home Care Services: No  Discharge Living Setting Plans for Discharge Living Setting: Patient's home;House;Lives with (comment) (Lives with Dtr, Lajoyce Corners, who is about to start a new job.) Type of Home at Discharge: House Discharge Home Layout: One level Discharge Home Access: Stairs to enter;Ramped entrance Entrance  Stairs-Number of Steps: 6-7 steps at back.  Has a ramp with a steep incline at front. Do you have any problems obtaining your medications?: No  Social/Family/Support Systems Patient Roles: Parent Contact Information: Brunetta Genera - sister, Tranika Scholler - Dtr, Tamsen Meek - sister Anticipated Caregiver: Dtr, sister, ?other family Anticipated Caregiver's Contact Information: Brunetta Genera - [phone], Tamsen Meek - sister (h) [phone] (c) [phone] Ability/Limitations of Caregiver: Dtr is about to start a new job.  Sister has children at home. Caregiver Availability: Intermittent Discharge Plan Discussed with Primary Caregiver: Yes Is Caregiver In Agreement with Plan?: Yes Does Caregiver/Family have Issues with Lodging/Transportation while Pt is in Rehab?: No  Goals/Additional Needs Patient/Family Goal for Rehab: PT/OT min/mod A, ST Supervision goals Expected length of stay: 2 weeks Cultural Considerations: None Dietary Needs: Carb Mod med calorie, thin liquids Equipment Needs: TBD Pt/Family Agrees to Admission and willing to participate: Yes Program Orientation Provided & Reviewed with Pt/Caregiver Including Roles  & Responsibilities: Yes   Decrease burden of Care through IP rehab admission: Decrease number of caregivers and Patient/family education   Possible need for SNF placement upon discharge: Yes.  Sisters may not be able to provide adequate care upon discharge.   Patient Condition: This patient's condition remains as documented in the consult dated 11/22/12, in which the Rehabilitation Physician determined and documented that the patient's condition is appropriate for intensive rehabilitative care in an inpatient rehabilitation facility.  In addition, see Dr. Wynn Banker update in chart on 11/23/12 where he stated that patient was more alert, had word finding deficits, had emotional lability.  Dr Wynn Banker also stated that cognition improved to the point where patient is now  appropriate for inpatient rehab.  Will admit to inpatient rehab today.  Preadmission Screen Completed By:  Trish Mage, 11/24/2012 10:14 AM ______________________________________________________________________   Discussed status with Dr. Wynn Banker on 11/24/12 at 1243 and received telephone approval for admission today.  Admission Coordinator:  Trish Mage, time1243/Date04/16/14

## 2012-11-24 NOTE — Progress Notes (Signed)
Received patient from 14.  Alert, oriented to self, place, and situation, disoriented to time.  Patient has expressive aphasia and delayed responses.  Bruising noted to left arm; skin intact.  Patient oriented to room and unit.  Denies pain; vitals stable.  Patient unable to answer admission history questions; history to be completed when family arrives, oncoming nurse will be notified.  Some medication sent to vault in pharmacy.  Will continue to monitor.

## 2012-11-24 NOTE — Progress Notes (Signed)
Rehab admissions - I have authorization from [employer] and can admit to acute inpatient rehab today.  Bed is available.  Call me for questions.  #[phone]

## 2012-11-24 NOTE — Progress Notes (Signed)
PT Progress Note:     11/24/12 0900  PT Visit Information  Last PT Received On 11/24/12  Assistance Needed +2  PT Time Calculation  PT Start Time 0750  PT Stop Time 0816  PT Time Calculation (min) 26 min  Subjective Data  Subjective "I just want to run home"  Precautions  Precautions Fall  Restrictions  Weight Bearing Restrictions No  Cognition  Arousal/Alertness Awake/alert  Behavior During Therapy Flat affect  Area of Impairment Awareness;Attention  Current Attention Level Selective  Following Commands Follows one step commands with increased time;Follows multi-step commands inconsistently  General Comments Pt with Rt inattention.  Has difficulty deciphering between R & L with tasks.    Overall Cognitive Status Impaired  Bed Mobility  Bed Mobility Rolling Right;Right Sidelying to Sit;Sitting - Scoot to Edge of Bed  Rolling Right 3: Mod assist;With rail  Right Sidelying to Sit 1: +2 Total assist;HOB flat;With rails  Right Sidelying to Sit: Patient Percentage 30%  Sitting - Scoot to Edge of Bed 2: Max assist  Details for Bed Mobility Assistance Pt requires significant (A) to intiate side>sit but able to complete from midpoint point with mod (A) & increased time to complete bringing torso & distributing weight through hips to sit upright .    Transfers  Transfers Sit to Stand;Stand to Sit;Stand Pivot Transfers  Sit to Stand 1: +2 Total assist;From bed  Stand to Sit 1: +2 Total assist;To chair/3-in-1;To bed  Stand Pivot Transfers 1: +2 Total assist  Transfer via Lift Equipment Huntley Dec Lift  Details for Transfer Assistance [place] plus to perform sit<>stand & Stand-pivot transfer today.   (A) + faciliation for increased trunk & hip extension & to laterally weight shift & to evenly distribute weight through LE's.    Ambulation/Gait  Ambulation/Gait Assistance Not tested (comment)  Static Sitting Balance  Static Sitting - Balance Support Feet supported;No upper extremity  supported  Static Sitting - Level of Assistance 5: Stand by assistance  Static Sitting - Comment/# of Minutes Cues for postural alignment.    Static Standing Balance  Static Standing - Balance Support Bilateral upper extremity supported  Static Standing - Level of Assistance Other (comment) (sara plus)  Static Standing - Comment/# of Minutes faciliation to promote trunk & hip extension for tall posture, weight shifting L<>R & to evenly distribute weight through BOS.    Other Exercises  Other Exercises Truncal muscle activation activity with use of UE's to (A) with translating torso forwards 2x's + holding 20 secs.    PT - End of Session  Equipment Utilized During Treatment Gait belt;Other (comment) (sara plus)  Activity Tolerance Patient limited by fatigue;Patient tolerated treatment well  Patient left in chair;with call bell/phone within reach;with family/visitor present  Nurse Communication Need for lift equipment  PT - Assessment/Plan  PT Plan Discharge plan remains appropriate  PT Frequency Min 4X/week  Follow Up Recommendations CIR;Supervision/Assistance - 24 hour  PT equipment Wheelchair (measurements PT);Wheelchair cushion (measurements PT)  Acute Rehab PT Goals  Time For Goal Achievement 12/03/12  Potential to Achieve Goals Good  Pt will Roll Supine to Right Side with rail;with supervision  PT Goal: Rolling Supine to Right Side - Progress Not met  Pt will Roll Supine to Left Side with min assist;with rail  Pt will go Supine/Side to Sit with min assist  PT Goal: Supine/Side to Sit - Progress Progressing toward goal  Pt will Sit at [facility] of Bed with modified independence;with no upper extremity support  PT Goal: Sit at [facility] Of Bed - Progress Progressing toward goal  Pt will go Sit to Supine/Side with mod assist  Pt will go Sit to Stand with upper extremity assist;with mod assist  PT Goal: Sit to Stand - Progress Progressing toward goal  Pt will go Stand to Sit with min  assist;with upper extremity assist  PT Goal: Stand to Sit - Progress Progressing toward goal  Pt will Stand with min assist;1 - 2 min;with unilateral upper extremity support  PT Goal: Stand - Progress Progressing toward goal  Pt will Ambulate 16 - 50 feet;with least restrictive assistive device;with mod assist  PT General Charges  $$ ACUTE PT VISIT 1 Procedure  PT Treatments  $Therapeutic Activity 23-37 mins    Verdell Face, [place] [phone] 11/24/2012

## 2012-11-24 NOTE — Discharge Summary (Signed)
Physician Discharge Summary  [name] ZOX:[id number] DOB: Feb 15, 1946 DOA: 11/17/2012  PCP: Astrid Divine, MD  Admit date: 11/17/2012 Discharge date: 11/24/2012  Time spent: 35 minutes  Recommendations for Outpatient Follow-up:  1. rehab  Discharge Diagnoses:  Active Problems:   Hypertension, accelerated   New Onset Atrial fibrillation with RVR   CKD (chronic kidney disease) stage 3, GFR 30-59 ml/min   Diabetes mellitus, controlled   Headache   Acute cerebral infarction/Large acute left PCA infarct & Small acute right PCA cortical infarct involving the occipital   Discharge Condition: improved  Diet recommendation: carb modified  Filed Weights   11/22/12 0547 11/23/12 0623 11/24/12 0618  Weight: 109.3 kg (240 lb 15.4 oz) 108.047 kg (238 lb 3.2 oz) 108.7 kg (239 lb 10.2 oz)    History of present illness:  67 y.o. female with history of diabetes mellitus type 2 and hypertension started experiencing headache last evening around 9 PM. It was mostly frontal and severe. EMS was called and patient was brought to the ER. Patient's blood pressure was found to be very high. In addition patient was found to be tachycardic and EKG showed patient is in A. fib with RVR which was new for her. CT of the head was negative for anything acute. Patient was nonfocal and denied any blurred vision, difficulty speaking or swallowing or any weakness of the upper lower extremities. Patient was found to be mildly confused initially. At time of admission the patient had been started on Cardizem infusion and has been admitted for further management. Patient denied any chest pain, shortness of breath, nausea/ vomiting or abdominal pain. Patient stated her blood pressure has not been well controlled recently and states that she has been compliant with her medications.  First 24 hours after admission patient initially had an appearance consistent with PRES during initial examination on the morning rounds.  Subsequently later in the day when attending physician returned to re-evaluate the patient she noticed some subtle focal neurological deficits involving the right arm and ptosis, right facial droop and diplopia. A positive MRI report was called revealing bilateral PCA territory strokes greater on the left. The MRI also revealed left parietal/temporal and left occipital involvement as well. This was associated with cytotoxic edema. There were also areas of chronic micro-hemorrhagic changes in the right cerebellar hemisphere. Neurology was consulted.  On 11/19/2012 patient continued to have symptoms consistent with evolving stroke. She had marked upper right extremity weakness although sensation remained intact. She was somewhat weaker on the left lower extremity than the right lower extremity. She had marked ptosis of the left eye with right facial drooping. She does have a prominent visual gaze deficit as evidenced by bilateral hemianopsia with left sided visual field preserved and intermittent bouts of diplopia.   Hospital Course:  Hypertension, accelerated  -BP much better controlled  -Neurology started Cardene which was weaned off  -converted to oral Lopressor since passed swallow eval  -was also on Lotrel (amlodipine/benazepril) preadmit- will hold since using multiple rate control meds at this time   Acute cerebral infarction/Large acute left PCA infarct & Small acute right PCA cortical infarct involving the occipital  -Preliminary report on Carotid doppler is negative  -ECHO no obvious embolic source identified  -spoke with Dr. Pearlean Brownie - we have started Xarelto and an 81 mg ASA per recommendations    New Onset Atrial fibrillation with RVR  -Cont BB- have increased to home dose  - Short acting CCB transitioned to long acting 4/13  CKD (chronic kidney disease) stage 3, GFR 30-59 ml/min  -Has progressed since 2008  -Because she was given IV contrast for CTA Head we gave bicarb infusion @  75/hr x 24   Diabetes mellitus, controlled  -HgbA1c 6.1-given her age and renal function this is an appropriate reading- too tight control in this pt population can lead to recurrent hypoglycemia  -would PERMANENTLY discontinue Metformin  - would recommend a different agent upon d/c- at this time sugars relatively controlled on SSI  Headache  - 2/2 high BP and new CVA- better per pt report   Procedures:  none  Consultations:  neuro  Discharge Exam: Filed Vitals:   11/24/12 0604 11/24/12 0618 11/24/12 0747 11/24/12 1034  BP: 154/90 183/109 164/96 136/91  Pulse:  91 96 91  Temp:  97.9 F (36.6 C)  98 F (36.7 C)  TempSrc:  Oral  Oral  Resp:  20  20  Height:      Weight:  108.7 kg (239 lb 10.2 oz)    SpO2:  96%  97%    General: A+Ox3, NAD Cardiovascular: rrr Respiratory: clear  Discharge Instructions  Discharge Orders   Future Orders Complete By Expires     Diet - low sodium heart healthy  As directed     Diet Carb Modified  As directed     Discharge instructions  As directed     Comments:      BS QID and may need oral medication at d/c    Increase activity slowly  As directed         Medication List    STOP taking these medications       amLODipine-benazepril 10-20 MG per capsule  Commonly known as:  LOTREL     hydrochlorothiazide 25 MG tablet  Commonly known as:  HYDRODIURIL     metFORMIN 500 MG tablet  Commonly known as:  GLUCOPHAGE      TAKE these medications       aspirin 81 MG chewable tablet  Chew 1 tablet (81 mg total) by mouth daily.     diltiazem 240 MG 24 hr capsule  Commonly known as:  CARDIZEM CD  Take 1 capsule (240 mg total) by mouth daily.     insulin aspart 100 UNIT/ML injection  Commonly known as:  novoLOG  Inject 0-9 Units into the skin 3 (three) times daily with meals.     metoprolol succinate 50 MG 24 hr tablet  Commonly known as:  TOPROL-XL  Take 50 mg by mouth daily. Take with or immediately following a meal.      Rivaroxaban 15 MG Tabs tablet  Commonly known as:  XARELTO  Take 1 tablet (15 mg total) by mouth daily with supper.          The results of significant diagnostics from this hospitalization (including imaging, microbiology, ancillary and laboratory) are listed below for reference.    Significant Diagnostic Studies: Ct Angio Head W/cm &/or Wo Cm  11/18/2012  *RADIOLOGY REPORT*  Clinical Data:  Left PCA territory infarct  CT ANGIOGRAPHY HEAD  Technique:  Multidetector CT imaging of the head was performed using the standard protocol during bolus administration of intravenous contrast.  Multiplanar CT image reconstructions including MIPs were obtained to evaluate the vascular anatomy.  Contrast: 50mL OMNIPAQUE IOHEXOL 350 MG/ML SOLN  Comparison:  MRI brain 11/18/2012.  CT head 11/18/2011.  Findings:  The left PCA territory infarct has evolved and is now much more evident by CT.  No other acute infarcts are present. Scattered periventricular subcortical white matter disease is stable.  There are remote lacunar infarcts of the thalamus bilaterally.  The ventricles are of normal size.  No significant pathologic enhancement is evident.  Minimal atherosclerotic calcifications are present cavernous carotid arteries bilaterally.  The A1 and M1 segments are normal. The left A1 segment is dominant.  The anterior communicating artery is patent.  Mild MCA small vessel disease is worse left than right.  The posterior cerebral arteries are codominant.  The PICA origins are visualized and normal.  The basilar artery is within normal limits.  The posterior cerebral arteries originate from basilar tip.  The left posterior cerebral artery is occluded proximally. This corresponds with the infarct territory.   Review of the MIP images confirms the above findings.  IMPRESSION:  1.  Proximal left posterior cerebral artery occlusion. 2.  Large left PCA territory infarct. 3.  Mild distal MCA small vessel disease, left greater  than right.   Original Report Authenticated By: Marin Roberts, M.D.    Dg Chest 2 View  11/18/2012  *RADIOLOGY REPORT*  Clinical Data: Hypertension.  Lethargy.  CHEST - 2 VIEW  Comparison: Two-view chest 05/20/2007.  Findings: Mild cardiac enlargement is noted.  Pulmonary vascular congestion is present.  No focal airspace consolidation is evident. Mild exaggerated thoracic kyphosis is present.  IMPRESSION:  1.  Mild cardiomegaly and pulmonary vascular congestion suggesting early congestive heart failure. 2.  No significant airspace consolidation.   Original Report Authenticated By: Marin Roberts, M.D.    Ct Head Wo Contrast  11/18/2012  *RADIOLOGY REPORT*  Clinical Data: Hypertension; severe headache.  CT HEAD WITHOUT CONTRAST  Technique:  Contiguous axial images were obtained from the base of the skull through the vertex without contrast.  Comparison: None.  Findings: There is no evidence of acute infarction, mass lesion, or intra- or extra-axial hemorrhage on CT.  Scattered periventricular and subcortical white matter change likely reflects small vessel ischemic microangiopathy.  Small chronic lacunar infarcts are noted at the thalami bilaterally. There is prominent calcification along the anterior falx cerebri.  The posterior fossa, including the cerebellum, brainstem and fourth ventricle, is within normal limits.  The third and lateral ventricles, and basal ganglia are unremarkable in appearance.  The cerebral hemispheres demonstrate grossly normal gray-white differentiation.  No mass effect or midline shift is seen.  There is no evidence of fracture; visualized osseous structures are unremarkable in appearance.  The visualized portions of the orbits are within normal limits.  The paranasal sinuses and mastoid air cells are well-aerated.  No significant soft tissue abnormalities are seen.  IMPRESSION:  1.  No acute intracranial pathology seen on CT. 2.  Scattered small vessel ischemic  microangiopathy. 3.  Small chronic lacunar infarcts at the thalami bilaterally.   Original Report Authenticated By: Tonia Ghent, M.D.    Mr Brain Wo Contrast  11/18/2012  *RADIOLOGY REPORT*  Clinical Data: 67 year old female with right side numbness and weakness.  Aphasia.  Possible visual field cut.  Extremely high blood pressure.  MRI HEAD WITHOUT CONTRAST  Technique:  Multiplanar, multiecho pulse sequences of the brain and surrounding structures were obtained according to standard protocol without intravenous contrast.  Comparison: Head CT without contrast 11/17/2012.  Findings: The examination had to be discontinued prior to completion due to patient anxiety.  Axial diffusion, axial T2-weighted imaging, and sagittal T1- weighted imaging was acquired.  Confluent restricted diffusion throughout much of the left PCA territory.  Extensive left occipital  lobe involvement.  Extension to the left mesial temporal lobe.  Patchy involvement of the inferior left parietal lobe. Small focus of involvement at the left dorsal lateral thalamus.  At the same time there are small areas of patchy mostly cortical restricted diffusion in the right occipital pole, right PCA territory.  No posterior fossa restricted diffusion.  No other supratentorial restricted diffusion.  Affected areas show T2 hyperintensity in a pattern compatible with cytotoxic edema.  Major intracranial vascular flow voids are grossly preserved.  No ventriculomegaly.  Superimposed periventricular white matter T2 and FLAIR hyperintensity plus chronic lacunar infarcts in the bilateral thalami, pons, and left corona radiata out a.  Probable chronic micro hemorrhage in the right pons. There is probably there are probably also one or more chronic micro hemorrhages in the right cerebellar hemisphere.  Grossly normal pituitary, cervicomedullary junction and visualized cervical spine.  Hyperostosis of the calvarium. Visualized orbit soft tissues are within normal  limits.  Visualized paranasal sinuses and mastoids are clear.  Negative visible scalp soft tissues.  IMPRESSION: 1.  Large acute left PCA infarct.  No significant mass effect at this time. 2.  Small acute right PCA cortical infarct involving the occipital pole. 3.  Underlying advanced chronic small vessel ischemia. 4.  The examination had to be discontinued prior to completion.   Original Report Authenticated By: Erskine Speed, M.D.     Microbiology: Recent Results (from the past 240 hour(s))  MRSA PCR SCREENING     Status: None   Collection Time    11/18/12  4:54 AM      Result Value Range Status   MRSA by PCR NEGATIVE  NEGATIVE Final   Comment:            The GeneXpert MRSA Assay (FDA     approved for NASAL specimens     only), is one component of a     comprehensive MRSA colonization     surveillance program. It is not     intended to diagnose MRSA     infection nor to guide or     monitor treatment for     MRSA infections.     Labs: Basic Metabolic Panel:  Recent Labs Lab 11/20/12 0630 11/21/12 0635 11/21/12 0840 11/22/12 0530 11/23/12 0500  NA 138 137 138 138 137  K 3.6 4.2 3.4* 3.9 4.0  CL 95* 96 97 99 101  CO2 35* 31 31 29 29   GLUCOSE 120* 120* 121* 122* 138*  BUN 28* 28* 28* 28* 25*  CREATININE 1.72* 1.74* 1.72* 1.74* 1.66*  CALCIUM 9.5 9.2 9.5 9.2 9.2   Liver Function Tests:  Recent Labs Lab 11/18/12 0904  AST 17  ALT 19  ALKPHOS 69  BILITOT 0.2*  PROT 8.4*  ALBUMIN 3.7   No results found for this basename: LIPASE, AMYLASE,  in the last 168 hours No results found for this basename: AMMONIA,  in the last 168 hours CBC:  Recent Labs Lab 11/17/12 2347 11/18/12 0011 11/18/12 0904  WBC 9.9  --  8.7  NEUTROABS 6.5  --  7.0  HGB 12.2 13.3 12.2  HCT 36.4 39.0 36.1  MCV 87.1  --  87.0  PLT 348  --  368   Cardiac Enzymes:  Recent Labs Lab 11/18/12 0330  TROPONINI <0.30   BNP: BNP (last 3 results) No results found for this basename: PROBNP,   in the last 8760 hours CBG:  Recent Labs Lab 11/22/12 2101 11/23/12 0616 11/23/12 1150  11/23/12 2104 11/24/12 0600  GLUCAP 207* 131* 175* 139* 146*       Signed:  VANN, JESSICA  [facility] 11/24/2012, 11:35 AM

## 2012-11-24 NOTE — Progress Notes (Signed)
CSW spoke to Roderic Palau, RN, [employer]- she has received authorization for admission to CIR per [employer] and she can accept today.  Patient is very pleased with this as she did not really want to go to a SNF.  CSW contacted patient's nurse- [place]. She is aware of above.  CSW signing off.  Lorri Frederick. [place]  [phone]

## 2012-11-25 ENCOUNTER — Inpatient Hospital Stay (HOSPITAL_COMMUNITY): Payer: PRIVATE HEALTH INSURANCE | Admitting: *Deleted

## 2012-11-25 ENCOUNTER — Inpatient Hospital Stay (HOSPITAL_COMMUNITY): Payer: PRIVATE HEALTH INSURANCE | Admitting: Occupational Therapy

## 2012-11-25 ENCOUNTER — Inpatient Hospital Stay (HOSPITAL_COMMUNITY): Payer: Medicare HMO

## 2012-11-25 ENCOUNTER — Inpatient Hospital Stay (HOSPITAL_COMMUNITY): Payer: PRIVATE HEALTH INSURANCE | Admitting: Speech Pathology

## 2012-11-25 DIAGNOSIS — I639 Cerebral infarction, unspecified: Secondary | ICD-10-CM

## 2012-11-25 DIAGNOSIS — G8110 Spastic hemiplegia affecting unspecified side: Secondary | ICD-10-CM

## 2012-11-25 DIAGNOSIS — I69921 Dysphasia following unspecified cerebrovascular disease: Secondary | ICD-10-CM

## 2012-11-25 DIAGNOSIS — I6340 Cerebral infarction due to embolism of unspecified cerebral artery: Secondary | ICD-10-CM

## 2012-11-25 LAB — COMPREHENSIVE METABOLIC PANEL
AST: 22 U/L (ref 0–37)
Alkaline Phosphatase: 63 U/L (ref 39–117)
BUN: 32 mg/dL — ABNORMAL HIGH (ref 6–23)
CO2: 26 mEq/L (ref 19–32)
Chloride: 100 mEq/L (ref 96–112)
Creatinine, Ser: 1.59 mg/dL — ABNORMAL HIGH (ref 0.50–1.10)
GFR calc non Af Amer: 33 mL/min — ABNORMAL LOW (ref >90)
Potassium: 3.7 mEq/L (ref 3.5–5.1)
Total Bilirubin: 0.4 mg/dL (ref 0.3–1.2)

## 2012-11-25 LAB — CBC WITH DIFFERENTIAL/PLATELET
HCT: 36.9 % (ref 36.0–46.0)
Hemoglobin: 12.5 g/dL (ref 12.0–15.0)
Lymphocytes Relative: 24 % (ref 12–46)
Monocytes Absolute: 0.8 10*3/uL (ref 0.1–1.0)
Monocytes Relative: 11 % (ref 3–12)
Neutro Abs: 3.8 10*3/uL (ref 1.7–7.7)
Neutrophils Relative %: 54 % (ref 43–77)
RBC: 4.35 MIL/uL (ref 3.87–5.11)
WBC: 7.1 10*3/uL (ref 4.0–10.5)

## 2012-11-25 LAB — GLUCOSE, CAPILLARY: Glucose-Capillary: 160 mg/dL — ABNORMAL HIGH (ref 70–99)

## 2012-11-25 MED ORDER — TRAMADOL HCL 50 MG PO TABS
50.0000 mg | ORAL_TABLET | Freq: Four times a day (QID) | ORAL | Status: DC | PRN
  Administered 2012-11-25 – 2012-11-30 (×8): 50 mg via ORAL
  Filled 2012-11-25 (×8): qty 1

## 2012-11-25 NOTE — Progress Notes (Signed)
Social Work Assessment and Plan Social Work Assessment and Plan  Patient Details  Name: [name] MRN: [id number] Date of Birth: [birth year]  Today's Date: 11/25/2012  Problem List:  Patient Active Problem List  Diagnosis  . Hypertension, accelerated  . New Onset Atrial fibrillation with RVR  . CKD (chronic kidney disease) stage 3, GFR 30-59 ml/min  . Diabetes mellitus, controlled  . Headache  . 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 CVA (cerebral infarction)   Past Medical History:  Past Medical History  Diagnosis Date  . Hypertension   . Diabetes mellitus   . Glaucoma   . Cataract    Past Surgical History: No past surgical history on file. Social History:  reports that she has never smoked. She does not have any smokeless tobacco history on file. She reports that she does not drink alcohol or use illicit drugs.  Family / Support Systems Marital Status: Widow/Widower Patient Roles: Parent;Other (Comment) (Sister) Children: [name]-daughter  [phone]-cell Other Supports: [name] [name]-sister  [phone]  [phone]    [name]-sister  [phone]-cell Anticipated Caregiver: Daughter and sister's Ability/Limitations of Caregiver: Aware pt will require 24 hour care at discharge.  All are discussing a plan at discharge Caregiver Availability: 24/7 Family Dynamics: Close knit family -sister's are staying with pt here so she is not alone here.  Very dedicated to her and want the best care for her.  Social History Preferred language: English Religion: Baptist Cultural Background: No issues Education: [employer] Read: Yes Write: Yes Employment Status: Disabled Fish farm manager Issues: No issues Guardian/Conservator: None-according to MD pt is not capable of making her own decisions.  Will look toward daughter to make pt's decisions while here.   Abuse/Neglect Physical  Abuse: Denies Verbal Abuse: Denies Sexual Abuse: Denies Exploitation of patient/patient's resources: Denies Self-Neglect: Denies  Emotional Status Pt's affect, behavior adn adjustment status: Pt is motivated to improve and recover to be as independent as possible from here.  She has always taken care of herself even with her health issues, but has also assisted her sister's with their issues. Recent Psychosocial Issues: Other medical issues Pyschiatric History: No history-depression screen is deferred due to pt's communication issues form stroke.  She is bright and is positive, tries to communicate.  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 Substance Abuse History: No issues  Patient / Family Perceptions, Expectations & Goals Pt/Family understanding of illness & functional limitations: Pt and family have a good understanding of her stroke and deficits.  They are concerned about her skin issues that have come up recently.  Will watch and make sure heal while here. Premorbid pt/family roles/activities: Mother, Sister, Retiree, etc Anticipated changes in roles/activities/participation: resume Pt/family expectations/goals: Pt states; " I want to do well."   Sister states: " We will do whatever she needs but are hopeful she will do well."  [employer]: None Premorbid Home Care/DME Agencies: None Transportation available at discharge: Family Resource referrals recommended: Support group (specify) (Stroke Support group)  Discharge Planning Living Arrangements: Children Support Systems: Children;Other relatives;Friends/neighbors;Church/faith community Type of Residence: Private residence Insurance Resources: Medicaid (specify county);Media planner (specify) ([facility] [place]) Financial Resources: SSD Financial Screen Referred: No Living Expenses: Lives with family Money Management: Patient Do you have any problems obtaining your  medications?: No Home Management: Pt and daughter Patient/Family Preliminary Plans: Return home with daughter who was planning moving out  but are on hold for now.  Sister's and daughter coming up with a plan for discharge.  Awarept will require 24 hour care at discharge. Social Work Anticipated Follow Up Needs: HH/OP;Support Group  Clinical Impression Pleasant female who is willing to do what it takes to improve and recover.  Sister's in room with are committed to pt and her care.  Await team's evaluations.  [name] 11/25/2012, 1:06 PM

## 2012-11-25 NOTE — Care Management Note (Signed)
[facility] Individual Statement of Services  Patient Name:  [name]  Date:  11/25/2012  Welcome to the [facility].  Our goal is to provide you with an individualized program based on your diagnosis and situation, designed to meet your specific needs.  With this comprehensive rehabilitation program, you will be expected to participate in at least 3 hours of rehabilitation therapies Monday-Friday, with modified therapy programming on the weekends.  Your rehabilitation program will include the following services:  Physical Therapy (PT), Occupational Therapy (OT), Speech Therapy (ST), 24 hour per day rehabilitation nursing, Case Management ( Social Worker), Rehabilitation Medicine, Nutrition Services and Pharmacy Services  Weekly team conferences will be held on Wednesday to discuss your progress.  Your Social Worker will talk with you frequently to get your input and to update you on team discussions.  Team conferences with you and your family in attendance may also be held.  Expected length of stay: 3.5 weeks  Overall anticipated outcome: min/mod level  Depending on your progress and recovery, your program may change. Your Social Worker will coordinate services and will keep you informed of any changes. Your Child psychotherapist names and contact numbers are listed  below.  The following services may also be recommended but are not provided by the [facility]:   Driving Evaluations  Home Health Rehabiltiation Services  Outpatient Rehabilitatation [facility]  Vocational Rehabilitation   Arrangements will be made to provide these services after discharge if needed.  Arrangements include referral to agencies that provide these services.  Your insurance has been verified to be:  [employer] & Medicaid Your primary doctor is:  Dr. Maurice Small  Pertinent information will be shared with your doctor and your insurance company.  Social  Worker:  Dossie Der, [place] [phone]  Information discussed with and copy given to patient by: Lucy Chris, 11/25/2012, 11:55 AM

## 2012-11-25 NOTE — Evaluation (Signed)
Speech Language Pathology Assessment and Plan  Patient Details  Name: [name] MRN: [id number] Date of Birth: [birth year]  SLP Diagnosis: Aphasia;Cognitive Impairments  Rehab Potential: Good ELOS: 3.5 weeks   Today's Date: 11/25/2012 Time: 4132-4401 Time Calculation (min): 57 min  Problem List:  Patient Active Problem List  Diagnosis  . Hypertension, accelerated  . New Onset Atrial fibrillation with RVR  . CKD (chronic kidney disease) stage 3, GFR 30-59 ml/min  . Diabetes mellitus, controlled  . Headache  . 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 CVA (cerebral infarction)   Past Medical History:  Past Medical History  Diagnosis Date  . Hypertension   . Diabetes mellitus   . Glaucoma   . Cataract    Past Surgical History: No past surgical history on file.  Assessment / Plan / Recommendation Clinical Impression  [name] is a 67 y.o. right-handed female with history of hypertension, diabetes mellitus with peripheral neuropathy. Admitted 11/18/2012 with headache and elevated blood pressure 172/127. EKG showed atrial fibrillation with RVR. 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 Echocardiogram with ejection fraction of 65% and no wall motion abnormalities. Carotid Dopplers with no ICA stenosis. Patient did not receive TPA. Started on Cardizem infusion for atrial fibrillation and transition to by mouth. 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 Neurology services consulted maintained on aspirin therapy as well as Xarelto. Patient was felt to be a good candidate for inpatient rehabilitation services and was admitted for comprehensive rehabilitation program 11/24/12.  Cognitive-Linguistic Evaluation completed and revealed mixed aphasia, characterized by fluent speech with anomia as well as semantic and  phonemic paraphasias, impaired ability to follow 1 step commands.  Motor planning, visual deficits and perseveration also impact her ability to follow commands.  Of note communication abilities impacted cognitive assessment and will continue to be assessed in diagnostic therapy sessions.  Patient demonstrated limited awareness of errors during session.  As a result, it is recommended that this patient will benefit from skilled SLP services to address these deficits, maximize independence and reduce burden of care prior to discharge home with family.    SLP initiated treatment and patient required max assist semantic and phonemic cues to name BADL objects and to follow 1 step commands due to impaired left/right discrimination and body part misidentification.     SLP Assessment  Patient will need skilled Speech Lanaguage Pathology Services during CIR admission    Recommendations  Diet Recommendations: Regular;Thin liquid Liquid Administration via: Cup;Straw Medication Administration: Whole meds with liquid Supervision:  (set-up) Patient destination: Home Follow up Recommendations: Outpatient SLP;24 hour supervision/assistance Equipment Recommended: None recommended by SLP    SLP Frequency 5 out of 7 days   SLP Treatment/Interventions Cognitive remediation/compensation;Cueing hierarchy;Environmental controls;Functional tasks;Internal/external aids;Patient/family education;Speech/Language facilitation;Therapeutic Activities    Pain Pain Assessment Pain Assessment: No/denies pain  Prior Functioning Cognitive/Linguistic Baseline: Within functional limits Type of Home: House Lives With: Daughter Available Help at Discharge: Available PRN/intermittently;Family  Short Term Goals: Week 1: SLP Short Term Goal 1 (Week 1): Patient will return use of call bell to request help with mod assist verbal and visual cues SLP Short Term Goal 2 (Week 1): Patient will label common objects with mod assist  semantic and phonemic cues SLP Short Term Goal 3 (Week 1): Patient will follow 1-step commands with mod assist visual cues SLP Short Term  Goal 4 (Week 1): Patient will increase awarenss of perseverative errors with max assist verbal and visual cues.  See FIM for current functional status Refer to Care Plan for Long Term Goals  Recommendations for other services: None  Discharge Criteria: Patient will be discharged from SLP if patient refuses treatment 3 consecutive times without medical reason, if treatment goals not met, if there is a change in medical status, if patient makes no progress towards goals or if patient is discharged from hospital.  The above assessment, treatment plan, treatment alternatives and goals were discussed and mutually agreed upon: by patient  [name], M.A., CCC-SLP [phone]  Hulan Szumski 11/25/2012, 4:28 PM

## 2012-11-25 NOTE — Progress Notes (Signed)
Patient ID: [name], female   DOB: 10/22/1945, 67 y.o.   MRN: [id number] Subjective/Complaints: 67 y.o. right-handed female with history of hypertension, diabetes mellitus with peripheral neuropathy. Admitted 11/18/2012 with headache and elevated blood pressure 172/127. EKG showed atrial fibrillation with RVR. 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 Echocardiogram with ejection fraction of 65% and no wall motion abnormalities. Carotid Dopplers with no ICA stenosis. Patient did not receive TPA. Started on Cardizem infusion for atrial fibrillation and transition to by mouth. 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 Neurology services consulted maintained on aspirin therapy as well as Xarelto. "I am cold" No other c/o Aphasic with impaired receptive and expressive language Review of Systems  Unable to perform ROS: medical condition     Objective: Vital Signs: Blood pressure 151/97, pulse 87, temperature 98.3 F (36.8 C), temperature source Oral, resp. rate 17, height 5\' 3"  (1.6 m), weight 107.9 kg (237 lb 14 oz), SpO2 97.00%. No results found. Results for orders placed during the hospital encounter of 11/24/12 (from the past 72 hour(s))  GLUCOSE, CAPILLARY     Status: Abnormal   Collection Time    11/24/12  5:07 PM      Result Value Range   Glucose-Capillary 146 (*) 70 - 99 mg/dL  GLUCOSE, CAPILLARY     Status: Abnormal   Collection Time    11/24/12  9:20 PM      Result Value Range   Glucose-Capillary 226 (*) 70 - 99 mg/dL      Vitals reviewed.  Gen: NAD Eyes:  Pupils reactive to light  Neck: Neck supple. No thyromegaly present.  Cardiovascular:  Irreg Irreg, no Murmur Pulmonary/Chest: Effort normal and breath sounds normal.  Abdominal: Bowel sounds are normal. She exhibits no distension.  Neurological: She is alert.  Patient is appropriate to basic conversation during exam. She  followed simple commands. She was able to provide her name and age. Noted left gaze preference.  Alert.Oriented to self only,Receptive language deficits noted  Dense right homonymous hemianopsia with poor compensation  Motor strength 3 minus in the right deltoid, biceps, triceps, grip, hip flexor knee extensor ankle dorsiflexor plantar flexor  4/5 on the left side  Sensory absent LT RUE and RLE, intact on left Mood/Affect:  Appropriate  Assessment/Plan: 1. Functional deficits secondary to Left PCA embolic infarct which require 3+ hours per day of interdisciplinary therapy in a comprehensive inpatient rehab setting. Physiatrist is providing close team supervision and 24 hour management of active medical problems listed below. Physiatrist and rehab team continue to assess barriers to discharge/monitor patient progress toward functional and medical goals. FIM:                   Comprehension Comprehension: 3-Understands basic 50 - 74% of the time/requires cueing 25 - 50%  of the time  Expression Expression: 2-Expresses basic 25 - 49% of the time/requires cueing 50 - 75% of the time. Uses single words/gestures.  Social Interaction Social Interaction: 4-Interacts appropriately 75 - 89% of the time - Needs redirection for appropriate language or to initiate interaction.  Problem Solving Problem Solving Mode: Not assessed  Memory Memory: 2-Recognizes or recalls 25 - 49% of the time/requires cueing 51 - 75% of the time  Medical Problem List and Plan:  1. Embolic large left PCA infarct  2. DVT Prophylaxis/Anticoagulation: Xarelto.  3. Neuropsych: This patient Is not capable of making decisions  on his/her own behalf.  4. New onset atrial fibrillation. Continue Cardizem and Toprol as advised.may need to adjust dose  5. Diabetes mellitus with peripheral neuropathy. Hemoglobin A1c 6.1. Presently with sliding scale insulin. Check blood sugars a.c. and at bedtime. Patient on Glucophage  500 mg twice a day prior to admission.  6. Hypertension. Patient on Lotrel 10-20 daily prior to admission as well as hydrochlorothiazide 25 mg daily. Will monitor with increased activity and resume as needed    LOS (Days) 1 A FACE TO FACE EVALUATION WAS PERFORMED  Sewell Pitner E 11/25/2012, 7:04 AM

## 2012-11-25 NOTE — Progress Notes (Signed)
Physical Therapy Session Note  Patient Details  Name: [name] MRN: [id number] Date of Birth: 1945-12-26  Today's Date: 11/25/2012 Time: 4098-1191 Time Calculation (min): 30 min  Short Term Goals: Week 1:  PT Short Term Goal 1 (Week 1): Pt will move supine > sit with min assist. PT Short Term Goal 2 (Week 1): Pt will transfer bed>< w/c with max assist. PT Short Term Goal 3 (Week 1): Pt will stand x 1 minute with assistance. PT Short Term Goal 4 (Week 1): Pt will demonstrate route finding with mod cues when up in w/c.  Skilled Therapeutic Interventions: Patient received in bed with HOB elevated. This session focused on supine>sit, bed>wheelchair transfer, and wheelchair mobility. Patient supine>sit with HOB elevated and use of bed rails and requires mod assist. Patient requires significantly increased time secondary to anxiety and fear of R hip pain. Patient does not allow therapist to move R LE very much at a time because of this pain. Attempted squat pivot transfer, but patient does not appear to actively assist. Patient continues to c/o R hip pain and demonstrates significant fear/anxiety with movement. Attempted use of slideboard to allow for patient to control amount of movement and perform transfer in a way that increased her comfort and decreased anxiety. Slideboard transfer unsuccessful and patient demonstrates significant difficulty with sequencing with slideboard and is unable to clear gluts enough to scoot. Patient requires +2 assist for squat pivot transfer to wheelchair and requires +2 to reposition properly in chair.  Patient instructed in wheelchair mobility in hall with B UE and max assist. Patient is distractible and requires max cues to attend to task. Patient only able to propel ~5' with max assist before c/o R hip pain. Switched R leg rest to elevated leg rest to assist with comfort when sitting in wheelchair. Patient left seated in wheelchair with seatbelt donned and all  needs within reach.  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, aphasia, ?apraxia, visual deficits - ?diplopia, decreased cognition Restrictions Weight Bearing Restrictions: No Other Position/Activity Restrictions: Per sister in room, pt had a bad right hip before this and was to have surgery, this is really painful to her now Pain: Pain Assessment Pain Assessment: 0-10 Pain Score:   8 Faces Pain Scale: Hurts a little bit Pain Type: Chronic pain Pain Location: Hip Pain Orientation: Right Pain Descriptors: Aching;Sharp;Shooting;Sore Pain Onset: With Activity Pain Intervention(s): Repositioned Multiple Pain Sites: No  See FIM for current functional status  Therapy/Group: Individual Therapy  [name], PT, DPT  11/25/2012, 3:43 PM

## 2012-11-25 NOTE — Evaluation (Signed)
Physical Therapy Assessment and Plan  Patient Details  Name: [name] MRN: [id number] Date of Birth: 1946-05-23  PT Diagnosis: Difficulty walking, Hemiparesis dominant, Impaired cognition, Impaired sensation and Pain in joint Rehab Potential: Fair ELOS: 3.5 weeks   Today's Date: 11/25/2012 Time: 1100-1210 Time Calculation (min): 70 min  Problem List:  Patient Active Problem List  Diagnosis  . Hypertension, accelerated  . New Onset Atrial fibrillation with RVR  . CKD (chronic kidney disease) stage 3, GFR 30-59 ml/min  . Diabetes mellitus, controlled  . Headache  . 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 CVA (cerebral infarction)    Past Medical History:  Past Medical History  Diagnosis Date  . Hypertension   . Diabetes mellitus   . Glaucoma   . Cataract    Past Surgical History: No past surgical history on file.  Assessment & Plan Clinical Impression: [name] is a 67 y.o. right-handed female with history of hypertension, diabetes mellitus with peripheral neuropathy. Admitted 11/18/2012 with headache and elevated blood pressure 172/127. EKG showed atrial fibrillation with RVR. 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 Echocardiogram with ejection fraction of 65% and no wall motion abnormalities. Carotid Dopplers with no ICA stenosis. Patient did not receive TPA. Started on Cardizem infusion for atrial fibrillation and transition to by mouth. 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   Patient transferred to CIR on 11/24/2012 .   Patient currently requires total with mobility secondary to muscle weakness and muscle joint tightness, impaired timing and sequencing, unbalanced muscle activation, decreased coordination and decreased motor planning, decreased visual perceptual skills, decreased visual  motor skills and field cut, decreased motor planning and decreased initiation, decreased attention, decreased awareness, decreased problem solving, decreased memory and delayed processing.  Prior to hospitalization, patient was modified independent  with mobility and lived with her daughter Daughter in a House single level home.  Home access is a steep, unsafe ramp (per sisters) or 6-7 steps 7Stairs to enter;Ramped entrance (pt's sister stated ramp is unsafe due to steepness). Family will not be able to provide significant physical assistance.  Patient will benefit from skilled PT intervention to maximize safe functional mobility, minimize fall risk and decrease caregiver burden for planned discharge home with 24 hour assist.  Anticipate patient will benefit from follow up [facility] at discharge.  PT - End of Session Activity Tolerance: Tolerates < 10 min activity with changes in vital signs Endurance Deficit: Yes PT Assessment Rehab Potential: Fair Barriers to Discharge: Inaccessible home environment;Decreased caregiver support PT Plan PT Duration Estimated Length of Stay: 3- 3.5 weeks PT Treatment/Interventions: Ambulation/gait training;Balance/vestibular training;Discharge planning;DME/adaptive equipment instruction;Functional electrical stimulation;Functional mobility training;Patient/family education;Pain management;Neuromuscular re-education;Psychosocial support;Splinting/orthotics;Therapeutic Exercise;Therapeutic Activities;Stair training;UE/LE Strength taining/ROM;UE/LE Coordination activities;Visual/perceptual remediation/compensation;Wheelchair propulsion/positioning PT Recommendation Follow Up Recommendations: Home health PT Patient destination: Home Equipment Recommended: Wheelchair (measurements);Wheelchair cushion (measurements);Rolling walker with 5" wheels  Skilled Therapeutic Intervention- sisters and niece present during eval.  They stated that pt had been seeing an MD for R hip pain  PTA, and was planning surgery, and also that pt has significant bil OA in knees. Pt was unable to tolerate movement due to pain; RN called; she administered Tylenol and was awaiting other meds for pain.  Pt sat bedside with feet and back unsupported x 10 minutes during L and R lateral leans to increase RUE wt bearing and  trunk activation during sit>< supine.  Pt frequently asked to "go slow" and was in pain when attempting to move.  No c/o of dizziness. Pt declined attempting to stand or transfer from raised bed, stating that her hip hurt .  PT Evaluation Precautions/Restrictions Precautions Precautions: Fall Precaution Comments: pain with PROM bil knees and R hip due to OA   Pain Pain Assessment Pain Assessment: 0-10 Pain Score:   2 Faces Pain Scale: Hurts a little bit Pain Type: Chronic pain Pain Location: Hip Pain Orientation: Right Pain Onset: With Activity Pain Intervention(s): Medication (See eMAR);Repositioned Home Living/Prior Functioning Home Living Lives With: Daughter Available Help at Discharge: Available PRN/intermittently;Family Type of Home: House Home Access: Stairs to enter;Ramped entrance (pt's sister stated ramp is unsafe due to steepness) Entrance Stairs-Number of Steps: 7 Home Layout: One level Prior Function Level of Independence: Independent with gait (no AD even when R hip was painful) Able to Take Stairs?: No Vision/Perception - significant deficits; see OT eval    Cognition Orientation Level: Oriented to person;Oriented to place;Disoriented to time;Disoriented to situation Sensation Sensation Light Touch: Impaired by gross assessment (RLE) Proprioception: Appears Intact (in ankle; difficult to assess due to hip and knee pain) Coordination Gross Motor Movements are Fluid and Coordinated: No (bil LEs due to R hip, bil knee pain) Heel Shin Test: unable with either LE Motor  Motor Motor: Hemiplegia  Mobility Bed Mobility Bed Mobility: Rolling  Right;Rolling Left;Right Sidelying to Sit Rolling Right: 5: Supervision;With rail Rolling Right Details: Verbal cues for technique Rolling Left: 3: Mod assist Rolling Left Details: Verbal cues for technique Right Sidelying to Sit: HOB elevated;3: Mod assist Sitting - Scoot to Edge of Bed: 3: Mod assist Transfers Sit to Stand:  (pt unable to participate due to R hip pain) Locomotion  Ambulation Ambulation: No Gait Gait: No Stairs / Additional Locomotion Stairs: No Wheelchair Mobility Wheelchair Mobility: No  Trunk/Postural Assessment  Cervical Assessment Cervical Assessment: Within Functional Limits Thoracic Assessment Thoracic Assessment: Within Functional Limits Lumbar Assessment Lumbar Assessment: Within Functional Limits Postural Control Postural Control: Within Functional Limits  Balance Balance Balance Assessed: Yes Static Sitting Balance Static Sitting - Balance Support: Feet unsupported;No upper extremity supported Static Sitting - Level of Assistance: 5: Stand by assistance Static Sitting - Comment/# of Minutes: 10 Extremity Assessment      RLE Assessment RLE Assessment: Exceptions to [facility] RLE Strength RLE Overall Strength Comments: hip and knee active motors noted in all planes, grossly 2-/5 but limited due to hip and knee pain; ankle DF 4/5; PROM limted by hip pathology PTA, and knee OA LLE Assessment LLE Assessment:  ( grossly 4/5 hip,knee; DF 5/5;knee OA limits flexion )  FIM:  FIM - Bed/Chair Transfer Bed/Chair Transfer: 0: Activity did not occur;3: Supine > Sit: Mod A (lifting assist/Pt. 50-74%/lift 2 legs FIM - Locomotion: Wheelchair Locomotion: Wheelchair: 0: Activity did not occur FIM - Locomotion: Ambulation Locomotion: Ambulation: 0: Activity did not occur FIM - Locomotion: Stairs Locomotion: Stairs: 0: Activity did not occur   Refer to Care Plan for Long Term Goals  Recommendations for other services: None  Discharge Criteria: Patient will  be discharged from PT if patient refuses treatment 3 consecutive times without medical reason, if treatment goals not met, if there is a change in medical status, if patient makes no progress towards goals or if patient is discharged from hospital.  The above assessment, treatment plan, treatment alternatives and goals were discussed and mutually agreed upon: by patient and by  family  Chrys Landgrebe 11/25/2012, 1:40 PM

## 2012-11-25 NOTE — Plan of Care (Signed)
Problem: Consults Goal: Diabetes Guidelines if Diabetic/Glucose > 140 If diabetic or lab glucose is > 140 mg/dl - Initiate Diabetes/Hyperglycemia Guidelines & Document Interventions  Outcome: Not Progressing CBG still > 140

## 2012-11-25 NOTE — Evaluation (Signed)
Occupational Therapy Assessment and Plan  Patient Details  Name: [name] MRN: [id number] Date of Birth: 09/21/1945  OT Diagnosis: acute pain, apraxia, cognitive deficits, disturbance of vision, hemiplegia affecting dominant side, muscle weakness (generalized) and pain in joint Rehab Potential: Rehab Potential: Good ELOS: 3-4 weeks   Today's Date: 11/25/2012 Time: 1100-1210 Time Calculation (min): 70 min  Problem List:  Patient Active Problem List  Diagnosis  . Hypertension, accelerated  . New Onset Atrial fibrillation with RVR  . CKD (chronic kidney disease) stage 3, GFR 30-59 ml/min  . Diabetes mellitus, controlled  . Headache  . 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 CVA (cerebral infarction)    Past Medical History:  Past Medical History  Diagnosis Date  . Hypertension   . Diabetes mellitus   . Glaucoma   . Cataract    Past Surgical History: No past surgical history on file.  Assessment & Plan Clinical Impression: A 67 y.o. right-handed female with history of hypertension, diabetes mellitus with peripheral neuropathy. Admitted 11/18/2012 with headache and elevated blood pressure 172/127. EKG showed atrial fibrillation with RVR. 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 Echocardiogram with ejection fraction of 65% and no wall motion abnormalities. 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   Patient transferred to CIR on 11/24/2012 .    Patient currently requires max- total assist with basic self-care skills and total assist with functional mobility secondary to muscle weakness, motor apraxia, decreased visual perceptual skills and (vision to be assessed further), decreased attention to right vs.right side neglect, cognitive deficits that are difficult to tease out due to aphasia, decreased  standing balance and hemiplegia.  Prior to hospitalization, patient was Mod I and went to the pool several times per week.  Patient will benefit from skilled intervention to increase independence with basic self-care skills prior to discharge home with care partner.  Anticipate patient will require 24 hour supervision, minimal physical assistance and moderate physical assestance and follow up home health.  OT - End of Session Activity Tolerance: Tolerates 30+ min activity with multiple rests Endurance Deficit: Yes Endurance Deficit Description: during BADL assessment, patient required rest breaks and requested to "slow down", unsure how much was due to pain (B knees and right hip) or dizziness with movement. OT Assessment Rehab Potential: Good Barriers to Discharge: Decreased caregiver support OT Plan OT Frequency: 5 out of 7 days OT Duration/Estimated Length of Stay: 3-4 weeks OT Treatment/Interventions: Balance/vestibular training;Cognitive remediation/compensation;Community reintegration;DME/adaptive equipment instruction;Discharge planning;Functional mobility training;Neuromuscular re-education;Pain management;Patient/family education;Psychosocial support;Self Care/advanced ADL retraining;Therapeutic Activities;Therapeutic Exercise;UE/LE Strength taining/ROM;UE/LE Coordination activities;Visual/perceptual remediation/compensation;Wheelchair propulsion/positioning OT Recommendation Patient destination: Home Follow Up Recommendations: Home health OT Equipment Recommended: 3 in 1 bedside commode  Skilled Therapeutic Intervention OT eval with findings incomplete due to aphasia and decreased cognition.  Will continue to assess functionally.  Self care retraining to include sponge bath and dressing EOB and supine rolling. Focused session on RUE forced use, visual attention, bed mobility, activity tolerance, communication.  Once of patient's sisters stayed for the entire OT session and the other  sister left the room after the first ~25 minutes.  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  Patient with aphasia and decreased  cognition therefore unable to provide full history or fully participate in evaluation.  Will continue to assess functionally.  OT Evaluation Precautions/Restrictions  Precautions Precautions: Fall Precaution Comments: pain with PROM bil knees and R hip due to OA, aphasia, ?apraxia, visual deficits - ?diplopia, decreased cognition Restrictions Other Position/Activity Restrictions: Per sister in room, pt had a bad right hip before this and was to have surgery, this is really painful to her now Pain Pain Assessment Pain Assessment: 0-10 Pain Score:   2 Faces Pain Scale: Hurts a little bit Pain Type: Chronic pain Pain Location: Hip Pain Orientation: Right Pain Onset: With Activity Pain Intervention(s): Medication (See eMAR);Repositioned Home Living/Prior Functioning Home Living Lives With: Daughter Available Help at Discharge: Available PRN/intermittently;Family Type of Home: House Home Access: Stairs to enter;Ramped entrance (ramp too steep/dangerous) Entrance Stairs-Number of Steps: 7 Home Layout: One level Bathroom Shower/Tub: Insurance underwriter: No Home Adaptive Equipment: Built-in shower seat Prior Function Level of Independence: Independent with gait Able to Take Stairs?: No Comments: due to aphasia and decreased cognition unable to determine full history Vision/Perception  Vision - History Baseline Vision: Wears glasses only for reading Visual History:  (Procedure that included a needle in patient's eye, per siste) Vision - Assessment Eye Alignment: Impaired (comment) Additional Comments: to be assessed further during functional tasks.  Patient unable to fully  participate  in formal assessment.  Patient reports diplopia. Praxis Praxis: Impaired Praxis Impairment Details: Motor planning  Cognition Overall Cognitive Status: Impaired/Different from baseline Arousal/Alertness: Awake/alert Orientation Level: Oriented to person;Disoriented to place;Disoriented to time;Disoriented to situation Attention: Sustained Sustained Attention: Appears intact Memory:  (to be further assessed) Awareness: Impaired Awareness Impairment: Intellectual impairment;Emergent impairment Problem Solving: Impaired Problem Solving Impairment: Verbal basic;Functional basic Safety/Judgment: Impaired Sensation Continue to assess Motor  Motor Motor: Hemiplegia Mobility  Bed Mobility Bed Mobility: Rolling Right;Rolling Left;Right Sidelying to Sit Rolling Right: 5: Supervision;With rail Rolling Right Details: Verbal cues for technique Rolling Left: 3: Mod assist Rolling Left Details: Verbal cues for technique Right Sidelying to Sit: HOB elevated;3: Mod assist Sitting - Scoot to Edge of Bed: 3: Mod assist Transfers Sit to Stand:  (pt unable to participate due to R hip pain)  Trunk/Postural Assessment  Cervical Assessment Cervical Assessment: Within Functional Limits Thoracic Assessment Thoracic Assessment: Within Functional Limits Lumbar Assessment Lumbar Assessment: Within Functional Limits Postural Control Postural Control: Within Functional Limits  Balance Balance Balance Assessed: Yes Static Sitting Balance Static Sitting - Balance Support: Feet unsupported;No upper extremity supported Static Sitting - Level of Assistance: 5: Stand by assistance Static Sitting - Comment/# of Minutes: 10 Extremity/Trunk Assessment RUE Assessment RUE Assessment: Exceptions to [facility] RUE AROM (degrees) RUE Overall AROM Comments: WFL except for end ranges with shoulder movements RUE PROM (degrees) RUE Overall PROM Comments: WFL RUE Strength RUE Overall Strength Comments: patient using  RUE during ADL when prompted, strength grossly 3+/4 functionall LUE Assessment LUE Assessment: Within Functional Limits  FIM:  FIM - Eating Eating Activity: 5: Supervision/cues;5: Set-up assist for open containers FIM - Grooming Grooming Steps: Wash, rinse, dry face;Wash, rinse, dry hands Grooming: 2: Patient completes 1 of 4 or 2 of 5 steps FIM - Bathing Bathing Steps Patient Completed: Chest;Abdomen Bathing: 1: Total-Patient completes 0-2 of 10 parts or less than 25% FIM - Upper Body Dressing/Undressing Upper body dressing/undressing steps patient completed: Thread/unthread right bra strap;Thread/unthread left bra strap;Thread/unthread right sleeve of pullover shirt/dresss;Thread/unthread left sleeve of pullover shirt/dress Upper body dressing/undressing: 3: Mod-Patient completed 50-74% of tasks FIM -  Lower Body Dressing/Undressing Lower body dressing/undressing steps patient completed: Thread/unthread left pants leg Lower body dressing/undressing: 1: Total-Patient completed less than 25% of tasks FIM - Press photographer Assistive Devices: HOB elevated;Bed rails;Arm rests Bed/Chair Transfer: 3: Supine > Sit: Mod A (lifting assist/Pt. 50-74%/lift 2 legs;1: Two helpers   Refer to Care Plan for Long Term Goals  Recommendations for other services: Neuropsych as aphasia and cognition improve  Discharge Criteria: Patient will be discharged from OT if patient refuses treatment 3 consecutive times without medical reason, if treatment goals not met, if there is a change in medical status, if patient makes no progress towards goals or if patient is discharged from hospital.  The above assessment, treatment plan, treatment alternatives and goals were discussed and mutually agreed upon: by patient and by family  Niesha Bame 11/25/2012, 3:49 PM

## 2012-11-26 ENCOUNTER — Inpatient Hospital Stay (HOSPITAL_COMMUNITY): Payer: PRIVATE HEALTH INSURANCE | Admitting: Speech Pathology

## 2012-11-26 ENCOUNTER — Inpatient Hospital Stay (HOSPITAL_COMMUNITY): Payer: PRIVATE HEALTH INSURANCE

## 2012-11-26 ENCOUNTER — Inpatient Hospital Stay (HOSPITAL_COMMUNITY): Payer: PRIVATE HEALTH INSURANCE | Admitting: Occupational Therapy

## 2012-11-26 ENCOUNTER — Inpatient Hospital Stay (HOSPITAL_COMMUNITY): Payer: PRIVATE HEALTH INSURANCE | Admitting: *Deleted

## 2012-11-26 LAB — GLUCOSE, CAPILLARY
Glucose-Capillary: 127 mg/dL — ABNORMAL HIGH (ref 70–99)
Glucose-Capillary: 162 mg/dL — ABNORMAL HIGH (ref 70–99)

## 2012-11-26 MED ORDER — METOPROLOL SUCCINATE ER 100 MG PO TB24
100.0000 mg | ORAL_TABLET | Freq: Every day | ORAL | Status: DC
  Administered 2012-11-26 – 2012-12-15 (×20): 100 mg via ORAL
  Filled 2012-11-26 (×22): qty 1

## 2012-11-26 MED ORDER — METFORMIN HCL 500 MG PO TABS: 500.0000 mg | ORAL_TABLET | Freq: Every day | ORAL | Status: DC

## 2012-11-26 NOTE — Progress Notes (Signed)
Patient ID: [name], female   DOB: 11/24/1945, 67 y.o.   MRN: [id number] Subjective/Complaints: 67 y.o. right-handed female with history of hypertension, diabetes mellitus with peripheral neuropathy. Admitted 11/18/2012 with headache and elevated blood pressure 172/127. EKG showed atrial fibrillation with RVR. 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 Echocardiogram with ejection fraction of 65% and no wall motion abnormalities. Carotid Dopplers with no ICA stenosis. Patient did not receive TPA. Started on Cardizem infusion for atrial fibrillation and transition to by mouth. 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 Neurology services consulted maintained on aspirin therapy as well as Xarelto. "I am cold" No other c/o Aphasic with impaired receptive and expressive language No other c/os Doesn't remember my name or my job Review of Systems  Unable to perform ROS: medical condition     Objective: Vital Signs: Blood pressure 141/82, pulse 86, temperature 97.9 F (36.6 C), temperature source Oral, resp. rate 18, height 5\' 3"  (1.6 m), weight 107.9 kg (237 lb 14 oz), SpO2 96.00%. No results found. Results for orders placed during the hospital encounter of 11/24/12 (from the past 72 hour(s))  GLUCOSE, CAPILLARY     Status: Abnormal   Collection Time    11/24/12  5:07 PM      Result Value Range   Glucose-Capillary 146 (*) 70 - 99 mg/dL  GLUCOSE, CAPILLARY     Status: Abnormal   Collection Time    11/24/12  9:20 PM      Result Value Range   Glucose-Capillary 226 (*) 70 - 99 mg/dL  CBC WITH DIFFERENTIAL     Status: Abnormal   Collection Time    11/25/12  6:25 AM      Result Value Range   WBC 7.1  4.0 - 10.5 K/uL   RBC 4.35  3.87 - 5.11 MIL/uL   Hemoglobin 12.5  12.0 - 15.0 g/dL   HCT 91.4  78.2 - 95.6 %   MCV 84.8  78.0 - 100.0 fL   MCH 28.7  26.0 - 34.0 pg   MCHC 33.9  30.0 - 36.0 g/dL    RDW 21.3  08.6 - 57.8 %   Platelets 371  150 - 400 K/uL   Neutrophils Relative 54  43 - 77 %   Neutro Abs 3.8  1.7 - 7.7 K/uL   Lymphocytes Relative 24  12 - 46 %   Lymphs Abs 1.7  0.7 - 4.0 K/uL   Monocytes Relative 11  3 - 12 %   Monocytes Absolute 0.8  0.1 - 1.0 K/uL   Eosinophils Relative 11 (*) 0 - 5 %   Eosinophils Absolute 0.8 (*) 0.0 - 0.7 K/uL   Basophils Relative 0  0 - 1 %   Basophils Absolute 0.0  0.0 - 0.1 K/uL  COMPREHENSIVE METABOLIC PANEL     Status: Abnormal   Collection Time    11/25/12  6:25 AM      Result Value Range   Sodium 135  135 - 145 mEq/L   Potassium 3.7  3.5 - 5.1 mEq/L   Chloride 100  96 - 112 mEq/L   CO2 26  19 - 32 mEq/L   Glucose, Bld 146 (*) 70 - 99 mg/dL   BUN 32 (*) 6 - 23 mg/dL   Creatinine, Ser 4.69 (*) 0.50 - 1.10 mg/dL   Calcium 9.0  8.4 - 62.9 mg/dL   Total Protein 7.0  6.0 - 8.3 g/dL   Albumin 2.6 (*) 3.5 - 5.2 g/dL   AST 22  0 - 37 U/L   ALT 25  0 - 35 U/L   Alkaline Phosphatase 63  39 - 117 U/L   Total Bilirubin 0.4  0.3 - 1.2 mg/dL   GFR calc non Af Amer 33 (*) >90 mL/min   GFR calc Af Amer 39 (*) >90 mL/min   Comment:            The eGFR has been calculated     using the CKD EPI equation.     This calculation has not been     validated in all clinical     situations.     eGFR's persistently     <90 mL/min signify     possible Chronic Kidney Disease.  GLUCOSE, CAPILLARY     Status: Abnormal   Collection Time    11/25/12  7:05 AM      Result Value Range   Glucose-Capillary 144 (*) 70 - 99 mg/dL   Comment 1 Notify RN    GLUCOSE, CAPILLARY     Status: Abnormal   Collection Time    11/25/12 11:24 AM      Result Value Range   Glucose-Capillary 160 (*) 70 - 99 mg/dL   Comment 1 Notify RN    GLUCOSE, CAPILLARY     Status: Abnormal   Collection Time    11/25/12  4:23 PM      Result Value Range   Glucose-Capillary 187 (*) 70 - 99 mg/dL   Comment 1 Notify RN    GLUCOSE, CAPILLARY     Status: Abnormal   Collection Time     11/25/12  8:55 PM      Result Value Range   Glucose-Capillary 175 (*) 70 - 99 mg/dL   Comment 1 Notify RN    GLUCOSE, CAPILLARY     Status: Abnormal   Collection Time    11/26/12  7:09 AM      Result Value Range   Glucose-Capillary 127 (*) 70 - 99 mg/dL   Comment 1 Notify RN        Vitals reviewed.  Gen: NAD Eyes:  Pupils reactive to light  Neck: Neck supple. No thyromegaly present.  Cardiovascular:  Irreg Irreg, no Murmur Pulmonary/Chest: Effort normal and breath sounds normal.  Abdominal: Bowel sounds are normal. She exhibits no distension.  Neurological: She is alert.  Patient is appropriate to basic conversation during exam. She followed simple commands. She was able to provide her name and age. Noted left gaze preference.  Alert.Oriented to self only,Receptive language deficits noted  Dense right homonymous hemianopsia with poor compensation  Motor strength 3 minus in the right deltoid, biceps, triceps, grip, hip flexor knee extensor ankle dorsiflexor plantar flexor  4/5 on the left side  Sensory absent LT RUE and RLE, intact on left Mood/Affect:  Appropriate  Assessment/Plan: 1. Functional deficits secondary to Left PCA embolic infarct which require 3+ hours per day of interdisciplinary therapy in a comprehensive inpatient rehab setting. Physiatrist is providing close team supervision and 24 hour management of active medical problems listed below. Physiatrist and rehab team continue to assess barriers to discharge/monitor patient progress toward functional and medical goals. FIM: FIM - Bathing Bathing Steps Patient Completed: Chest;Abdomen Bathing: 1: Total-Patient completes 0-2 of 10 parts or less than 25%  FIM - Upper Body Dressing/Undressing Upper body dressing/undressing steps patient completed: Thread/unthread right bra strap;Thread/unthread left bra strap;Thread/unthread  right sleeve of pullover shirt/dresss;Thread/unthread left sleeve of pullover  shirt/dress Upper body dressing/undressing: 3: Mod-Patient completed 50-74% of tasks FIM - Lower Body Dressing/Undressing Lower body dressing/undressing steps patient completed: Thread/unthread left pants leg Lower body dressing/undressing: 1: Total-Patient completed less than 25% of tasks        FIM - Press photographer Assistive Devices: HOB elevated;Bed rails;Arm rests Bed/Chair Transfer: 3: Supine > Sit: Mod A (lifting assist/Pt. 50-74%/lift 2 legs;1: Two helpers  FIM - Locomotion: Wheelchair Locomotion: Wheelchair: 1: Travels less than 50 ft with maximal assistance (Pt: 25 - 49%) FIM - Locomotion: Ambulation Ambulation/Gait Assistance: Not tested (comment) Locomotion: Ambulation: 0: Activity did not occur  Comprehension Comprehension Mode: Auditory Comprehension: 2-Understands basic 25 - 49% of the time/requires cueing 51 - 75% of the time  Expression Expression Mode: Verbal Expression: 2-Expresses basic 25 - 49% of the time/requires cueing 50 - 75% of the time. Uses single words/gestures.  Social Interaction Social Interaction: 4-Interacts appropriately 75 - 89% of the time - Needs redirection for appropriate language or to initiate interaction.  Problem Solving Problem Solving Mode: Not assessed Problem Solving: 2-Solves basic 25 - 49% of the time - needs direction more than half the time to initiate, plan or complete simple activities  Memory Memory: 2-Recognizes or recalls 25 - 49% of the time/requires cueing 51 - 75% of the time  Medical Problem List and Plan:  1. Embolic large left PCA infarct  2. DVT Prophylaxis/Anticoagulation: Xarelto.  3. Neuropsych: This patient Is not capable of making decisions on his/her own behalf.  4. New onset atrial fibrillation. Continue Cardizem and Toprol as advised.may need to adjust dose  5. Diabetes mellitus with peripheral neuropathy. Hemoglobin A1c 6.1. Presently with sliding scale insulin. Check blood  sugars a.c. and at bedtime. Patient on Glucophage 500 mg twice a day prior to admission. Will resume 6. Hypertension. Patient on Lotrel 10-20 daily prior to admission as well as hydrochlorothiazide 25 mg daily. Will monitor with increased activity and resume as needed, may not need this going forward since pt is on toprol and cardizem for rate control   LOS (Days) 2 A FACE TO FACE EVALUATION WAS PERFORMED  [name] E 11/26/2012, 10:05 AM

## 2012-11-26 NOTE — Progress Notes (Addendum)
Physical Therapy Session Note  Patient Details  Name: [name] MRN: [id number] Date of Birth: 1946/01/12  Today's Date: 11/26/2012 Time: 1405-1450 Time Calculation (min): 45 min  Short Term Goals: Week 1:  PT Short Term Goal 1 (Week 1): Pt will move supine > sit with min assist. PT Short Term Goal 2 (Week 1): Pt will transfer bed>< w/c with max assist. PT Short Term Goal 3 (Week 1): Pt will stand x 1 minute with assistance. PT Short Term Goal 4 (Week 1): Pt will demonstrate route finding with mod cues when up in w/c.  Skilled Therapeutic Interventions/Progress Updates:    Patient received sitting in wheelchair. This session focused on NMR for core and trunk musculature and R LE with functional mobility. Patient performed scooting transfers wheelchair<>mat, scooting along edge of mat, anterior weight shifting with emphasis on postural control, sit<>stands and pre-gait activities (L forward/retro stepping). Patient requires +2 assist for transfers, standing, and stepping activities. Patient continues to be limited by anxiety related to pain in R hip and B knees, but does well with activities if attention is not brought to it. Patient is tangential in her speech and requires frequent redirection and verbal cues to attend to task or remember what the task is.  Patient returned to room and left seated in wheelchair with seatbelt donned and all needs within reach.  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, aphasia, ?apraxia, visual deficits - ?diplopia, decreased cognition Restrictions Weight Bearing Restrictions: No Other Position/Activity Restrictions: Per sister in room, pt had a bad right hip before this and was to have surgery, this is really painful to her now Pain: Pain Assessment Pain Assessment: No/denies pain Pain Score: 0-No pain Pain Location: Hip Pain Orientation: Right Locomotion  : Ambulation Ambulation/Gait Assistance: 1: +2 Total assist   See FIM for current functional status  Therapy/Group: Co-Treatment with OT  Chipper Herb. Colman Birdwell, PT, DPT  11/26/2012, 4:26 PM

## 2012-11-26 NOTE — Progress Notes (Signed)
Physical Therapy Note  Patient Details  Name: [name] MRN: [id number] Date of Birth: 1946-05-11 Today's Date: 11/26/2012  11:00 - 11:30 (co treat with OT 11 - 12) 30 minutes (60 total divided with OT) Co-treatment with OT Patient reports pain in right hip at 6.  Patient resting in bed upon entering room. Patient max assist to get sitting edge of bed due to requiring assist with right LE due to pain and occasional assist with left LE. Patient able to maintain sitting edge of bed with supervision. Patient +2 assist for sliding board transfer. Patient attempts to assist with scooting but requires lifting assistance due to pain and apraxia. Patient pushed by therapist to gym. Patient sit to stand at parallel bar - pulling on bar. Patient max assist to stand - once standing patient was able to maintain for short periods without assistance holding to bar. Patient stood ~ 6 minutes total. Patient sit to stand to rolling walker and took 4 steps with +2 assist. Patient has difficulty unweighting right LE to progress it forward requiring max to total assist for weight shift and swing on right. Patient stood an additional time with the rolling walker for 6 minutes working on weight shifting to left. Patient returned to room via wheelchair.     Arelia Longest M 11/26/2012, 12:39 PM

## 2012-11-26 NOTE — Progress Notes (Signed)
PHARMACIST - PHYSICIAN COMMUNICATION Dr Wynn Banker CONCERNING:  METFORMIN SAFE ADMINISTRATION POLICY  RECOMMENDATION: Metformin has been placed on DISCONTINUE (rejected order) STATUS and should be reordered only after any of the conditions below are ruled out.  DESCRIPTION:  The Pharmacy Committee has adopted a policy that restricts the use of metformin in hospitalized patients until all the contraindications to administration have been ruled out. Specific contraindications are: []  Serum creatinine ? 1.5 for males [x]  Serum creatinine ? 1.4 for females []  Shock, acute MI, sepsis, hypoxemia, dehydration []  Planned administration of intravenous iodinated contrast media []  Heart Failure patients with low EF []  Acute or chronic metabolic acidosis (including DKA)

## 2012-11-26 NOTE — Progress Notes (Signed)
Patient ID: Apurva Reily, female   DOB: Jan 03, 1946, 67 y.o.   MRN: [id number] Subjective/Complaints: 67 y.o. right-handed female with history of hypertension, diabetes mellitus with peripheral neuropathy. Admitted 11/18/2012 with headache and elevated blood pressure 172/127. EKG showed atrial fibrillation with RVR. 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 Echocardiogram with ejection fraction of 65% and no wall motion abnormalities. Carotid Dopplers with no ICA stenosis. Patient did not receive TPA. Started on Cardizem infusion for atrial fibrillation and transition to by mouth. 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 Neurology services consulted maintained on aspirin therapy as well as Xarelto. "I am cold" No other c/o Aphasic with impaired receptive and expressive language Review of Systems  Unable to perform ROS: medical condition     Objective: Vital Signs: Blood pressure 139/85, pulse 83, temperature 97.9 F (36.6 C), temperature source Oral, resp. rate 18, height 5\' 3"  (1.6 m), weight 107.9 kg (237 lb 14 oz), SpO2 96.00%. No results found. Results for orders placed during the hospital encounter of 11/24/12 (from the past 72 hour(s))  GLUCOSE, CAPILLARY     Status: Abnormal   Collection Time    11/24/12  5:07 PM      Result Value Range   Glucose-Capillary 146 (*) 70 - 99 mg/dL  GLUCOSE, CAPILLARY     Status: Abnormal   Collection Time    11/24/12  9:20 PM      Result Value Range   Glucose-Capillary 226 (*) 70 - 99 mg/dL  CBC WITH DIFFERENTIAL     Status: Abnormal   Collection Time    11/25/12  6:25 AM      Result Value Range   WBC 7.1  4.0 - 10.5 K/uL   RBC 4.35  3.87 - 5.11 MIL/uL   Hemoglobin 12.5  12.0 - 15.0 g/dL   HCT 40.9  81.1 - 91.4 %   MCV 84.8  78.0 - 100.0 fL   MCH 28.7  26.0 - 34.0 pg   MCHC 33.9  30.0 - 36.0 g/dL   RDW 78.2  95.6 - 21.3 %   Platelets 371  150 - 400  K/uL   Neutrophils Relative 54  43 - 77 %   Neutro Abs 3.8  1.7 - 7.7 K/uL   Lymphocytes Relative 24  12 - 46 %   Lymphs Abs 1.7  0.7 - 4.0 K/uL   Monocytes Relative 11  3 - 12 %   Monocytes Absolute 0.8  0.1 - 1.0 K/uL   Eosinophils Relative 11 (*) 0 - 5 %   Eosinophils Absolute 0.8 (*) 0.0 - 0.7 K/uL   Basophils Relative 0  0 - 1 %   Basophils Absolute 0.0  0.0 - 0.1 K/uL  COMPREHENSIVE METABOLIC PANEL     Status: Abnormal   Collection Time    11/25/12  6:25 AM      Result Value Range   Sodium 135  135 - 145 mEq/L   Potassium 3.7  3.5 - 5.1 mEq/L   Chloride 100  96 - 112 mEq/L   CO2 26  19 - 32 mEq/L   Glucose, Bld 146 (*) 70 - 99 mg/dL   BUN 32 (*) 6 - 23 mg/dL   Creatinine, Ser 0.86 (*) 0.50 - 1.10 mg/dL   Calcium 9.0  8.4 - 57.8 mg/dL   Total Protein 7.0  6.0 - 8.3 g/dL   Albumin 2.6 (*) 3.5 -  5.2 g/dL   AST 22  0 - 37 U/L   ALT 25  0 - 35 U/L   Alkaline Phosphatase 63  39 - 117 U/L   Total Bilirubin 0.4  0.3 - 1.2 mg/dL   GFR calc non Af Amer 33 (*) >90 mL/min   GFR calc Af Amer 39 (*) >90 mL/min   Comment:            The eGFR has been calculated     using the CKD EPI equation.     This calculation has not been     validated in all clinical     situations.     eGFR's persistently     <90 mL/min signify     possible Chronic Kidney Disease.  GLUCOSE, CAPILLARY     Status: Abnormal   Collection Time    11/25/12  7:05 AM      Result Value Range   Glucose-Capillary 144 (*) 70 - 99 mg/dL   Comment 1 Notify RN    GLUCOSE, CAPILLARY     Status: Abnormal   Collection Time    11/25/12 11:24 AM      Result Value Range   Glucose-Capillary 160 (*) 70 - 99 mg/dL   Comment 1 Notify RN    GLUCOSE, CAPILLARY     Status: Abnormal   Collection Time    11/25/12  4:23 PM      Result Value Range   Glucose-Capillary 187 (*) 70 - 99 mg/dL   Comment 1 Notify RN    GLUCOSE, CAPILLARY     Status: Abnormal   Collection Time    11/25/12  8:55 PM      Result Value Range    Glucose-Capillary 175 (*) 70 - 99 mg/dL   Comment 1 Notify RN    GLUCOSE, CAPILLARY     Status: Abnormal   Collection Time    11/26/12  7:09 AM      Result Value Range   Glucose-Capillary 127 (*) 70 - 99 mg/dL   Comment 1 Notify RN        Vitals reviewed.  Gen: NAD Eyes:  Pupils reactive to light  Neck: Neck supple. No thyromegaly present.  Cardiovascular:  Irreg Irreg, no Murmur Pulmonary/Chest: Effort normal and breath sounds normal.  Abdominal: Bowel sounds are normal. She exhibits no distension.  Neurological: She is alert.  Patient is appropriate to basic conversation during exam. She followed simple commands. She was able to provide her name and age. Noted left gaze preference.  Alert.Oriented to self only,Receptive language deficits noted  Dense right homonymous hemianopsia with poor compensation  Motor strength 3 minus in the right deltoid, biceps, triceps, grip, hip flexor knee extensor ankle dorsiflexor plantar flexor  4/5 on the left side  Sensory absent LT RUE and RLE, intact on left Mood/Affect:  Appropriate  Assessment/Plan: 1. Functional deficits secondary to Left PCA embolic infarct which require 3+ hours per day of interdisciplinary therapy in a comprehensive inpatient rehab setting. Physiatrist is providing close team supervision and 24 hour management of active medical problems listed below. Physiatrist and rehab team continue to assess barriers to discharge/monitor patient progress toward functional and medical goals. FIM: FIM - Bathing Bathing Steps Patient Completed: Chest;Abdomen Bathing: 1: Total-Patient completes 0-2 of 10 parts or less than 25%  FIM - Upper Body Dressing/Undressing Upper body dressing/undressing steps patient completed: Thread/unthread right bra strap;Thread/unthread left bra strap;Thread/unthread right sleeve of pullover shirt/dresss;Thread/unthread left sleeve of pullover shirt/dress Upper  body dressing/undressing: 3: Mod-Patient  completed 50-74% of tasks FIM - Lower Body Dressing/Undressing Lower body dressing/undressing steps patient completed: Thread/unthread left pants leg Lower body dressing/undressing: 1: Total-Patient completed less than 25% of tasks        FIM - Press photographer Assistive Devices: HOB elevated;Bed rails;Arm rests Bed/Chair Transfer: 3: Supine > Sit: Mod A (lifting assist/Pt. 50-74%/lift 2 legs;1: Two helpers  FIM - Locomotion: Wheelchair Locomotion: Wheelchair: 1: Travels less than 50 ft with maximal assistance (Pt: 25 - 49%) FIM - Locomotion: Ambulation Ambulation/Gait Assistance: Not tested (comment) Locomotion: Ambulation: 0: Activity did not occur  Comprehension Comprehension Mode: Auditory Comprehension: 2-Understands basic 25 - 49% of the time/requires cueing 51 - 75% of the time  Expression Expression Mode: Verbal Expression: 2-Expresses basic 25 - 49% of the time/requires cueing 50 - 75% of the time. Uses single words/gestures.  Social Interaction Social Interaction: 4-Interacts appropriately 75 - 89% of the time - Needs redirection for appropriate language or to initiate interaction.  Problem Solving Problem Solving Mode: Not assessed Problem Solving: 2-Solves basic 25 - 49% of the time - needs direction more than half the time to initiate, plan or complete simple activities  Memory Memory: 2-Recognizes or recalls 25 - 49% of the time/requires cueing 51 - 75% of the time  Medical Problem List and Plan:  1. Embolic large left PCA infarct  2. DVT Prophylaxis/Anticoagulation: Xarelto.  3. Neuropsych: This patient Is not capable of making decisions on his/her own behalf.  4. New onset atrial fibrillation. Continue Cardizem and Toprol as advised.may need to adjust dose  5. Diabetes mellitus with peripheral neuropathy. Hemoglobin A1c 6.1. Presently with sliding scale insulin. Check blood sugars a.c. and at bedtime. Patient on Glucophage 500 mg twice a  day prior to admission. Will resume 6. Hypertension. Patient on Lotrel 10-20 daily prior to admission as well as hydrochlorothiazide 25 mg daily. Will monitor with increased activity and resume as needed, may not need this going forward since pt is on toprol and cardizem for rate control   LOS (Days) 2 A FACE TO FACE EVALUATION WAS PERFORMED  KIRSTEINS,ANDREW E 11/26/2012, 7:45 AM

## 2012-11-26 NOTE — Progress Notes (Signed)
Patient information reviewed and entered into e[facility]hab system by Tora Duck, RN, CRRN, PPS Coordinator.  Information including medical coding and functional independence measure will be reviewed and updated through discharge.     Per nursing patient was given "Data Collection Information Summary for Patients in Inpatient Rehabilitation Facilities with attached "Privacy Act Statement-Health Care Records" upon admission to be reviewed with family.

## 2012-11-26 NOTE — Progress Notes (Signed)
Speech Language Pathology Daily Session Note  Patient Details  Name: [name] MRN: [id number] Date of Birth: 11/22/1945  Today's Date: 11/26/2012 Time: 0930-1000 Time Calculation (min): 30 min  Short Term Goals: Week 1: SLP Short Term Goal 1 (Week 1): Patient will return use of call bell to request help with mod assist verbal and visual cues SLP Short Term Goal 2 (Week 1): Patient will label common objects with mod assist semantic and phonemic cues SLP Short Term Goal 3 (Week 1): Patient will follow 1-step commands with mod assist visual cues SLP Short Term Goal 4 (Week 1): Patient will increase awarenss of perseverative errors with max assist verbal and visual cues.  Skilled Therapeutic Interventions: Skilled treatment session focused on addressing language goals.  SLP entered room and found patient frustrated, attempting to feed self; SLP facilitated self-feeding with had over hand assist to locate items on tray (applesauce and spoon) and max assist to problem solve task due to visual-perceptual deficits as well as impaired motor planning.  As a result, it is recommended that this patient receive full staff assist with p.o. intake.  SLP also facilitated session with max assist semantic and phonemic cues to name basic ADL items.  Of note, patient demonstrated decreased verbal perseverations today needing min assist cues for re-direction.  Patient also required mod assist cues to identify body parts and discriminate between left/right.  SLP ended session with max assist multi-modal cues to return demonstration of call bell use.  Continue with current plan of care.   FIM:  Comprehension Comprehension Mode: Auditory Comprehension: 2-Understands basic 25 - 49% of the time/requires cueing 51 - 75% of the time Expression Expression Mode: Verbal Expression: 2-Expresses basic 25 - 49% of the time/requires cueing 50 - 75% of the time. Uses single words/gestures. Social Interaction Social  Interaction: 4-Interacts appropriately 75 - 89% of the time - Needs redirection for appropriate language or to initiate interaction. Problem Solving Problem Solving: 2-Solves basic 25 - 49% of the time - needs direction more than half the time to initiate, plan or complete simple activities Memory Memory: 2-Recognizes or recalls 25 - 49% of the time/requires cueing 51 - 75% of the time FIM - Eating Eating Activity: 2: Hand over hand assist  Pain Pain Assessment Pain Assessment: No/denies pain  Therapy/Group: Individual Therapy  [name]., CCC-SLP [phone]  [name] 11/26/2012, 11:13 AM

## 2012-11-26 NOTE — Progress Notes (Signed)
Occupational Therapy Session Notes  Patient Details  Name: [name] MRN: [id number] Date of Birth: 03-Jan-1946  Today's Date: 11/26/2012 Time: 0800-0900 and 1130-1200 Time Calculation (min): 60 min and 30 min  Short Term Goals: Week 1:  OT Short Term Goal 1 (Week 1): Bath:  Mod A with LB bath OT Short Term Goal 2 (Week 1): UB Dressing:  Mod Assist OT Short Term Goal 3 (Week 1): LB Dressing:  Max Assist to include stand OT Short Term Goal 4 (Week 1): RUE:  Use RUE at least 50% of the time during BADL tasks with mod cues OT Short Term Goal 5 (Week 1): Vision:  Continue to evaluate patient's vision and begin to address PRN  Skilled Therapeutic Interventions/Progress Updates:  1)  Patient in bed with HOB up with breakfast tray in front of her and she appeared to be finished.  Patient did not scan to her right to know that she had 2 more items on her tray and she is using her left non dominant hand to feed self.  Recommended to RN that patient have at least intermittent supervision after assist for set up.  Self care retraining to include sponge bath and dress.  Focused session on activity tolerance, visual scanning and visual attention, forced use of RUE, use of language to name familiar self care items, hand over hand to increase success when using RUE for tasks.  When asked to identify an item placed in front of her, patient often reaches for items with her hands to identify with touch instead of using her vision to identify.  LB bath and dress supine and roll, UB HOB up.  2)  Co-Treat with PT and addressed bed>w/c via SB with +2 assist, sit><stands, standing tolerance and balance to include lateral weight shifts, ambulate with RW +2 (see PT note for details on ambulation).  Focused session on activity tolerance, visual attention and visual scanning, forced use of RUE, following 1 step commands and assist with apraxia by first guiding patient through a movement then she would finish to movement  correctly.  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, aphasia, ?apraxia, visual deficits - ?diplopia, decreased cognition Restrictions Weight Bearing Restrictions: No Other Position/Activity Restrictions: Per sister in room, pt had a bad right hip before this and was to have surgery, this is really painful to her now Pain: 1)  5/10 right hip, RN aware and medication provided, rest and reposition 2)  4/10 right hip and right knee, rest and reposition ADL: See FIM for current functional status  Therapy/Group: 1) Individual Therapy and 2) Co-Treat with PT (RW)  Daemyn Gariepy 11/26/2012, 12:03 PM

## 2012-11-27 ENCOUNTER — Inpatient Hospital Stay (HOSPITAL_COMMUNITY): Payer: PRIVATE HEALTH INSURANCE | Admitting: Speech Pathology

## 2012-11-27 ENCOUNTER — Inpatient Hospital Stay (HOSPITAL_COMMUNITY): Payer: PRIVATE HEALTH INSURANCE | Admitting: *Deleted

## 2012-11-27 ENCOUNTER — Inpatient Hospital Stay (HOSPITAL_COMMUNITY): Payer: Medicare HMO | Admitting: *Deleted

## 2012-11-27 DIAGNOSIS — I6340 Cerebral infarction due to embolism of unspecified cerebral artery: Secondary | ICD-10-CM

## 2012-11-27 DIAGNOSIS — I69921 Dysphasia following unspecified cerebrovascular disease: Secondary | ICD-10-CM

## 2012-11-27 DIAGNOSIS — G8110 Spastic hemiplegia affecting unspecified side: Secondary | ICD-10-CM

## 2012-11-27 LAB — GLUCOSE, CAPILLARY
Glucose-Capillary: 165 mg/dL — ABNORMAL HIGH (ref 70–99)
Glucose-Capillary: 177 mg/dL — ABNORMAL HIGH (ref 70–99)
Glucose-Capillary: 198 mg/dL — ABNORMAL HIGH (ref 70–99)

## 2012-11-27 NOTE — Progress Notes (Signed)
Physical Therapy Session Note  Patient Details  Name: [name] MRN: [id number] Date of Birth: [birth year]  Today's Date: 11/27/2012 Time: 1000-1030 Time Calculation (min): 30 min and patient missed whole 30 min session in PM secondary to refusal  Short Term Goals: Week 1:  PT Short Term Goal 1 (Week 1): Pt will move supine > sit with min assist. PT Short Term Goal 2 (Week 1): Pt will transfer bed>< w/c with max assist. PT Short Term Goal 3 (Week 1): Pt will stand x 1 minute with assistance. PT Short Term Goal 4 (Week 1): Pt will demonstrate route finding with mod cues when up in w/c.  Skilled Therapeutic Interventions/Progress Updates:  AM Session: Discussion with nurse tech prior to entering patient's room that patient had become agitated with her when attempting to assist her in self feeding. Patient received sitting upright in bed with HOB elevated and tray table in front of her. When asked how she is doing, patient replies "I am starving." Oriented patient to tray table with meal on it in front of her. Attempted to facilitate patient sitting edge of bed for meal, but patient states "I'm not moving until I eat". Patient locates milk on tray and brings it to her mouth, but spills down the front of her. Patient continues to drink only the milk despite verbal and visual cues for other food items on plate. Patient repeatedly demonstrates difficulty locating mouth when attempting to sip milk and requires 3-4 attempts to properly position prior to sipping.   When attempting to assist patient with initiation of eating other food on plate, patient becomes agitated with hand over hand assistance and states "I need to learn to do this myself, I don't want help." Patient handed fork after ~20" of difficulty locating fork on tray. Patient attempts to use fork to scoop food and repeatedly brings empty fork to her mouth. Patient agreeable to assistance with putting food on fork. Demonstrated to patient that  it might be easier to use tongs on fork rather than scooping food. Patient handed fork with food on it and she brings it to her mouth and states "this is too big of a bite, there's too much", demonstrating good safety awareness with appropriate bite size. Patient returns to attempting to scoop food on fork, pushing it off of plate. Patient becomes agitated when assistance offered again. Patient returned to eating and after another 2-3 minutes states that she is done.  Attempted to initiate getting out of bed and patient states "what are we going to do?" PT provides plans for treatment session. Patient states "I really don't feel like doing much of anything." PT provides max encouragement for patient to participate in therapy. Patient asks "Can I just have one day off?" PT informs patient that she has this current session and another PT session this PM and patient, becoming more visibly agitated replies "So it sounds like I don't get today off? I have to do everything you say?" Discussion with patient that she would never be forced to do anything against her will and the benefits of therapy to increase strength and independence so she can return home. Patient states "I just want the whole day off." Patient informed of policy regarding three refusal and patient states "I don't care, please just leave me alone; I don't want to see you again today." Patient left sitting upright in bed with HOB elevated and all needs within reach. Will follow up this PM.  PM Session: Patient received  sitting in wheelchair. Patient immediately states "I'm not doing anything." Attempted to motivate/encourage patient to participate in short 30 min session. Patient shakes head no repeatedly and said "I will do it tomorrow." Again, attempted to engage patient, but without success. Patient returned to room and left seated in wheelchair with seatbelt donned and all needs within reach. Continue to follow up as able.  Therapy  Documentation Precautions:  Precautions Precautions: Fall Precaution Comments: pain with PROM bil knees and R hip due to OA, aphasia, ?apraxia, visual deficits - ?diplopia, decreased cognition Restrictions Weight Bearing Restrictions: No Other Position/Activity Restrictions: Per sister in room, pt had a bad right hip before this and was to have surgery, this is really painful to her now General: Amount of Missed PT Time (min): 30 Minutes x2 (30 minutes missed in AM, 30 minutes missed in PM) Missed Time Reason: Patient unwilling/refused to participate without medical reason Pain: Pain Assessment Pain Assessment: No/denies pain Pain Score: 0-No pain  See FIM for current functional status  Therapy/Group: Individual Therapy  Chipper Herb. Suheyb Raucci, PT, DPT  11/27/2012, 10:59 AM

## 2012-11-27 NOTE — Progress Notes (Signed)
Speech Language Pathology Daily Session Note  Patient Details  Name: [name] MRN: [id number] Date of Birth: 18-May-1946  Today's Date: 11/27/2012 Time: 1325-1405 Time Calculation (min): 40 min  Short Term Goals: Week 1: SLP Short Term Goal 1 (Week 1): Patient will return use of call bell to request help with mod assist verbal and visual cues SLP Short Term Goal 2 (Week 1): Patient will label common objects with mod assist semantic and phonemic cues SLP Short Term Goal 3 (Week 1): Patient will follow 1-step commands with mod assist visual cues SLP Short Term Goal 4 (Week 1): Patient will increase awarenss of perseverative errors with max assist verbal and visual cues.  Skilled Therapeutic Interventions: Skilled treatment session focused on addressing language goals.  SLP entered room and found patient requesting to eat lunch.  As a result, SLP facilitated session with set-up assist and increased wait time to problem solve locating and self-feeding items.  Patient verbalized frustration with staff helping her too much and as a result, ordered were modified to an intermittent supervision level.  SLP also facilitated session with mod assist semantic cues and initial phoneme to state family member names; max assist semantic and phonemic cues to label food items on tray.  Continue with current plan of care.   FIM:  Comprehension Comprehension Mode: Auditory Comprehension: 3-Understands basic 50 - 74% of the time/requires cueing 25 - 50%  of the time Expression Expression Mode: Verbal Expression: 3-Expresses basic 50 - 74% of the time/requires cueing 25 - 50% of the time. Needs to repeat parts of sentences. Social Interaction Social Interaction: 4-Interacts appropriately 75 - 89% of the time - Needs redirection for appropriate language or to initiate interaction. Problem Solving Problem Solving: 2-Solves basic 25 - 49% of the time - needs direction more than half the time to initiate, plan or  complete simple activities Memory Memory: 3-Recognizes or recalls 50 - 74% of the time/requires cueing 25 - 49% of the time FIM - Eating Eating Activity: 5: Supervision/cues  Pain Pain Assessment Pain Assessment: No/denies pain  Therapy/Group: Individual Therapy  Charlane Ferretti., CCC-SLP [phone]  [name] 11/27/2012, 3:17 PM

## 2012-11-27 NOTE — Progress Notes (Signed)
Patient ID: [name], female   DOB: 06/20/1946, 67 y.o.   MRN: [id number] Subjective/Complaints: 67 y.o. right-handed female with history of hypertension, diabetes mellitus with peripheral neuropathy. Admitted 11/18/2012 with headache and elevated blood pressure 172/127. EKG showed atrial fibrillation with RVR. 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 Echocardiogram with ejection fraction of 65% and no wall motion abnormalities. Carotid Dopplers with no ICA stenosis. Patient did not receive TPA. Started on Cardizem infusion for atrial fibrillation and transition to by mouth. 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 Neurology services consulted maintained on aspirin therapy as well as Xarelto.  No complaints. Slept well.        Objective: Vital Signs: Blood pressure 152/96, pulse 82, temperature 98.5 F (36.9 C), temperature source Oral, resp. rate 18, height 5\' 3"  (1.6 m), weight 107.9 kg (237 lb 14 oz), SpO2 97.00%. No results found. Results for orders placed during the hospital encounter of 11/24/12 (from the past 72 hour(s))  GLUCOSE, CAPILLARY     Status: Abnormal   Collection Time    11/24/12  5:07 PM      Result Value Range   Glucose-Capillary 146 (*) 70 - 99 mg/dL  GLUCOSE, CAPILLARY     Status: Abnormal   Collection Time    11/24/12  9:20 PM      Result Value Range   Glucose-Capillary 226 (*) 70 - 99 mg/dL  CBC WITH DIFFERENTIAL     Status: Abnormal   Collection Time    11/25/12  6:25 AM      Result Value Range   WBC 7.1  4.0 - 10.5 K/uL   RBC 4.35  3.87 - 5.11 MIL/uL   Hemoglobin 12.5  12.0 - 15.0 g/dL   HCT 14.7  82.9 - 56.2 %   MCV 84.8  78.0 - 100.0 fL   MCH 28.7  26.0 - 34.0 pg   MCHC 33.9  30.0 - 36.0 g/dL   RDW 13.0  86.5 - 78.4 %   Platelets 371  150 - 400 K/uL   Neutrophils Relative 54  43 - 77 %   Neutro Abs 3.8  1.7 - 7.7 K/uL   Lymphocytes Relative 24  12 -  46 %   Lymphs Abs 1.7  0.7 - 4.0 K/uL   Monocytes Relative 11  3 - 12 %   Monocytes Absolute 0.8  0.1 - 1.0 K/uL   Eosinophils Relative 11 (*) 0 - 5 %   Eosinophils Absolute 0.8 (*) 0.0 - 0.7 K/uL   Basophils Relative 0  0 - 1 %   Basophils Absolute 0.0  0.0 - 0.1 K/uL  COMPREHENSIVE METABOLIC PANEL     Status: Abnormal   Collection Time    11/25/12  6:25 AM      Result Value Range   Sodium 135  135 - 145 mEq/L   Potassium 3.7  3.5 - 5.1 mEq/L   Chloride 100  96 - 112 mEq/L   CO2 26  19 - 32 mEq/L   Glucose, Bld 146 (*) 70 - 99 mg/dL   BUN 32 (*) 6 - 23 mg/dL   Creatinine, Ser 6.96 (*) 0.50 - 1.10 mg/dL   Calcium 9.0  8.4 - 29.5 mg/dL   Total Protein 7.0  6.0 - 8.3 g/dL   Albumin 2.6 (*) 3.5 - 5.2 g/dL   AST 22  0 - 37 U/L   ALT  25  0 - 35 U/L   Alkaline Phosphatase 63  39 - 117 U/L   Total Bilirubin 0.4  0.3 - 1.2 mg/dL   GFR calc non Af Amer 33 (*) >90 mL/min   GFR calc Af Amer 39 (*) >90 mL/min   Comment:            The eGFR has been calculated     using the CKD EPI equation.     This calculation has not been     validated in all clinical     situations.     eGFR's persistently     <90 mL/min signify     possible Chronic Kidney Disease.  GLUCOSE, CAPILLARY     Status: Abnormal   Collection Time    11/25/12  7:05 AM      Result Value Range   Glucose-Capillary 144 (*) 70 - 99 mg/dL   Comment 1 Notify RN    GLUCOSE, CAPILLARY     Status: Abnormal   Collection Time    11/25/12 11:24 AM      Result Value Range   Glucose-Capillary 160 (*) 70 - 99 mg/dL   Comment 1 Notify RN    GLUCOSE, CAPILLARY     Status: Abnormal   Collection Time    11/25/12  4:23 PM      Result Value Range   Glucose-Capillary 187 (*) 70 - 99 mg/dL   Comment 1 Notify RN    GLUCOSE, CAPILLARY     Status: Abnormal   Collection Time    11/25/12  8:55 PM      Result Value Range   Glucose-Capillary 175 (*) 70 - 99 mg/dL   Comment 1 Notify RN    GLUCOSE, CAPILLARY     Status: Abnormal    Collection Time    11/26/12  7:09 AM      Result Value Range   Glucose-Capillary 127 (*) 70 - 99 mg/dL   Comment 1 Notify RN    GLUCOSE, CAPILLARY     Status: Abnormal   Collection Time    11/26/12 11:05 AM      Result Value Range   Glucose-Capillary 187 (*) 70 - 99 mg/dL   Comment 1 Notify RN    GLUCOSE, CAPILLARY     Status: Abnormal   Collection Time    11/26/12  8:39 PM      Result Value Range   Glucose-Capillary 162 (*) 70 - 99 mg/dL   Comment 1 Notify RN    GLUCOSE, CAPILLARY     Status: Abnormal   Collection Time    11/27/12  7:30 AM      Result Value Range   Glucose-Capillary 124 (*) 70 - 99 mg/dL   Comment 1 Notify RN        Vitals reviewed.  Gen: NAD Eyes:  Pupils reactive to light  Neck: Neck supple. No thyromegaly present.  Cardiovascular:  Irreg Irreg, no Murmur Pulmonary/Chest: Effort normal and breath sounds normal.  Abdominal: Bowel sounds are normal. She exhibits no distension.  Neurological: She is alert.  Patient is appropriate to basic conversation during exam. She followed simple commands. She was able to provide her name and age. Noted left gaze preference.  Alert.Oriented to self only,Receptive language deficits noted  Dense right homonymous hemianopsia with poor compensation  Motor strength 3 to 4- in the right deltoid, biceps, triceps, grip, hip flexor knee extensor ankle dorsiflexor plantar flexor  4/5 on the left side. Needs cues  to engage the right side.  Sensory absent LT RUE and RLE, intact on left Mood/Affect:  Appropriate  Assessment/Plan: 1. Functional deficits secondary to Left PCA embolic infarct which require 3+ hours per day of interdisciplinary therapy in a comprehensive inpatient rehab setting. Physiatrist is providing close team supervision and 24 hour management of active medical problems listed below. Physiatrist and rehab team continue to assess barriers to discharge/monitor patient progress toward functional and medical  goals. FIM: FIM - Bathing Bathing Steps Patient Completed: Chest;Abdomen;Right Arm;Left Arm Bathing: 2: Max-Patient completes 3-4 28f 10 parts or 25-49% (HOB up and supine and roll)  FIM - Upper Body Dressing/Undressing Upper body dressing/undressing steps patient completed: Thread/unthread right bra strap;Thread/unthread left bra strap;Thread/unthread right sleeve of pullover shirt/dresss;Thread/unthread left sleeve of pullover shirt/dress;Put head through opening of pull over shirt/dress Upper body dressing/undressing: 4: Min-Patient completed 75 plus % of tasks FIM - Lower Body Dressing/Undressing Lower body dressing/undressing steps patient completed: Thread/unthread left pants leg Lower body dressing/undressing: 1: Total-Patient completed less than 25% of tasks        FIM - Banker Devices: Arm rests;Sliding board Bed/Chair Transfer: 1: Two helpers  FIM - Locomotion: Wheelchair Locomotion: Wheelchair: 1: Total Assistance/staff pushes wheelchair (Pt<25%) FIM - Locomotion: Ambulation Locomotion: Ambulation Assistive Devices: Designer, industrial/product Ambulation/Gait Assistance: 1: +2 Total assist Locomotion: Ambulation: 0: Activity did not occur  Comprehension Comprehension Mode: Auditory Comprehension: 2-Understands basic 25 - 49% of the time/requires cueing 51 - 75% of the time  Expression Expression Mode: Verbal Expression: 2-Expresses basic 25 - 49% of the time/requires cueing 50 - 75% of the time. Uses single words/gestures.  Social Interaction Social Interaction: 4-Interacts appropriately 75 - 89% of the time - Needs redirection for appropriate language or to initiate interaction.  Problem Solving Problem Solving Mode: Not assessed Problem Solving: 2-Solves basic 25 - 49% of the time - needs direction more than half the time to initiate, plan or complete simple activities  Memory Memory: 2-Recognizes or recalls 25 - 49% of the  time/requires cueing 51 - 75% of the time  Medical Problem List and Plan:  1. Embolic large left PCA infarct  2. DVT Prophylaxis/Anticoagulation: Xarelto.  3. Neuropsych: This patient Is not capable of making decisions on his/her own behalf.  4. New onset atrial fibrillation. Continue Cardizem and Toprol as advised.may need to adjust dose  5. Diabetes mellitus with peripheral neuropathy. Hemoglobin A1c 6.1. SSI. gluophage resumed. Fair control at present. 6. Hypertension. Patient on Lotrel 10-20 daily prior to admission as well as hydrochlorothiazide 25 mg daily.   - pt is on toprol and cardizem for rate control with BP borderline  -no changes at present   LOS (Days) 3 A FACE TO FACE EVALUATION WAS PERFORMED  Mamadou Breon T 11/27/2012, 8:21 AM

## 2012-11-27 NOTE — Progress Notes (Addendum)
Occupational Therapy Note  Patient Details  Name: [name] MRN: [id number] Date of Birth: 03-29-1946 Today's Date: 11/27/2012 1st session Time:  3086-5784 Pain;  6/10 right upper thigh and left 3rd finger Individual session  Patient in bed with HOB up with breakfast tray in front of her and she appeared to be finished.. Self care retraining to include sponge bath and dress. Focused session on activity tolerance, visual scanning and visual attention, forced use of RUE, naming items used, RUE for tasks.LB bath and dress supine and roll,  Pt. Rolled to right with minimal assist and to left iwith max assist.  Pt's right thigh pain is limiting factor.  2nd session:  Time:  1300-1330  Pain=    6/10  Location right thigh Individual session Pt. Lying in bed upon OT arrival.  Sister present.  Addressed bed mobility, sitting balance, transfers to wc with sliding board,attending to the right side of body, postural control.  Pt. Needed much encouragement to get out of bed.   Pt. Showed decreased body awareness, decreased motor planning, and decreased right left discrimination.  Pt needed max assist to go from supine to sit with manual cues and verbal cues for hand placement.  Went from bed to wc with sliding board and total assist plus 2.  Repositioned pt  In wc for symmetry and left in room with family present.        Humberto Seals 11/27/2012, 11:38 AM

## 2012-11-28 ENCOUNTER — Inpatient Hospital Stay (HOSPITAL_COMMUNITY): Payer: Medicare HMO | Admitting: Occupational Therapy

## 2012-11-28 LAB — GLUCOSE, CAPILLARY: Glucose-Capillary: 139 mg/dL — ABNORMAL HIGH (ref 70–99)

## 2012-11-28 MED ORDER — DILTIAZEM HCL ER COATED BEADS 300 MG PO CP24
300.0000 mg | ORAL_CAPSULE | Freq: Every day | ORAL | Status: DC
  Administered 2012-11-29 – 2012-12-01 (×3): 300 mg via ORAL
  Filled 2012-11-28 (×6): qty 1

## 2012-11-28 NOTE — Progress Notes (Signed)
Patient ID: [name], female   DOB: 03/22/1946, 67 y.o.   MRN: [id number] Subjective/Complaints: 67 y.o. right-handed female with history of hypertension, diabetes mellitus with peripheral neuropathy. Admitted 11/18/2012 with headache and elevated blood pressure 172/127. EKG showed atrial fibrillation with RVR. 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 Echocardiogram with ejection fraction of 65% and no wall motion abnormalities. Carotid Dopplers with no ICA stenosis. Patient did not receive TPA. Started on Cardizem infusion for atrial fibrillation and transition to by mouth. 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 Neurology services consulted maintained on aspirin therapy as well as Xarelto.  Chilly this am. (room is cold!).        Objective: Vital Signs: Blood pressure 157/86, pulse 91, temperature 98.5 F (36.9 C), temperature source Oral, resp. rate 17, height 5\' 3"  (1.6 m), weight 107.9 kg (237 lb 14 oz), SpO2 95.00%. No results found. Results for orders placed during the hospital encounter of 11/24/12 (from the past 72 hour(s))  GLUCOSE, CAPILLARY     Status: Abnormal   Collection Time    11/25/12 11:24 AM      Result Value Range   Glucose-Capillary 160 (*) 70 - 99 mg/dL   Comment 1 Notify RN    GLUCOSE, CAPILLARY     Status: Abnormal   Collection Time    11/25/12  4:23 PM      Result Value Range   Glucose-Capillary 187 (*) 70 - 99 mg/dL   Comment 1 Notify RN    GLUCOSE, CAPILLARY     Status: Abnormal   Collection Time    11/25/12  8:55 PM      Result Value Range   Glucose-Capillary 175 (*) 70 - 99 mg/dL   Comment 1 Notify RN    GLUCOSE, CAPILLARY     Status: Abnormal   Collection Time    11/26/12  7:09 AM      Result Value Range   Glucose-Capillary 127 (*) 70 - 99 mg/dL   Comment 1 Notify RN    GLUCOSE, CAPILLARY     Status: Abnormal   Collection Time    11/26/12 11:05  AM      Result Value Range   Glucose-Capillary 187 (*) 70 - 99 mg/dL   Comment 1 Notify RN    GLUCOSE, CAPILLARY     Status: Abnormal   Collection Time    11/26/12  4:40 PM      Result Value Range   Glucose-Capillary 165 (*) 70 - 99 mg/dL   Comment 1 Notify RN    GLUCOSE, CAPILLARY     Status: Abnormal   Collection Time    11/26/12  8:39 PM      Result Value Range   Glucose-Capillary 162 (*) 70 - 99 mg/dL   Comment 1 Notify RN    GLUCOSE, CAPILLARY     Status: Abnormal   Collection Time    11/27/12  7:30 AM      Result Value Range   Glucose-Capillary 124 (*) 70 - 99 mg/dL   Comment 1 Notify RN    GLUCOSE, CAPILLARY     Status: Abnormal   Collection Time    11/27/12  1:10 PM      Result Value Range   Glucose-Capillary 177 (*) 70 - 99 mg/dL   Comment 1 Notify RN    GLUCOSE, CAPILLARY     Status: Abnormal   Collection Time  11/27/12  4:52 PM      Result Value Range   Glucose-Capillary 168 (*) 70 - 99 mg/dL   Comment 1 Notify RN    GLUCOSE, CAPILLARY     Status: Abnormal   Collection Time    11/27/12  9:07 PM      Result Value Range   Glucose-Capillary 198 (*) 70 - 99 mg/dL  GLUCOSE, CAPILLARY     Status: Abnormal   Collection Time    11/28/12  7:18 AM      Result Value Range   Glucose-Capillary 139 (*) 70 - 99 mg/dL      Vitals reviewed.  Gen: NAD Eyes:  Pupils reactive to light  Neck: Neck supple. No thyromegaly present.  Cardiovascular:  Irreg Irreg, no Murmur Pulmonary/Chest: Effort normal and breath sounds normal.  Abdominal: Bowel sounds are normal. She exhibits no distension.  Neurological: She is alert.  Patient is appropriate to basic conversation during exam. She followed simple commands. She was able to provide her name and age. Noted left gaze preference is persistent. Is able to use left UE however for functional activities Alert.Oriented to self only,Receptive language deficits noted  Dense right homonymous hemianopsia with poor compensation   Motor strength 3 to 4- in the right deltoid, biceps, triceps, grip, hip flexor knee extensor ankle dorsiflexor plantar flexor  4/5 on the left side. Needs cues to engage the right side.  Sensory absent LT RUE and RLE, intact on left Mood/Affect:  Appropriate  Assessment/Plan: 1. Functional deficits secondary to Left PCA embolic infarct which require 3+ hours per day of interdisciplinary therapy in a comprehensive inpatient rehab setting. Physiatrist is providing close team supervision and 24 hour management of active medical problems listed below. Physiatrist and rehab team continue to assess barriers to discharge/monitor patient progress toward functional and medical goals. FIM: FIM - Bathing Bathing Steps Patient Completed: Chest;Abdomen;Right Arm;Left Arm;Front perineal area;Right upper leg;Left upper leg Bathing: 3: Mod-Patient completes 5-7 65f 10 parts or 50-74%  FIM - Upper Body Dressing/Undressing Upper body dressing/undressing steps patient completed: Thread/unthread right bra strap;Thread/unthread left bra strap;Thread/unthread right sleeve of pullover shirt/dresss;Thread/unthread left sleeve of pullover shirt/dress;Put head through opening of pull over shirt/dress Upper body dressing/undressing: 0: Wears gown/pajamas-no public clothing FIM - Lower Body Dressing/Undressing Lower body dressing/undressing steps patient completed: Thread/unthread left pants leg Lower body dressing/undressing: 1: Total-Patient completed less than 25% of tasks        FIM - Banker Devices: Sliding board Bed/Chair Transfer: 1: Chair or W/C > Bed: Total A (helper does all/Pt. < 25%);1: Two helpers  FIM - Locomotion: Wheelchair Locomotion: Wheelchair: 0: Activity did not occur FIM - Locomotion: Ambulation Locomotion: Ambulation Assistive Devices: Designer, industrial/product Ambulation/Gait Assistance: 1: +2 Total assist Locomotion: Ambulation: 0: Activity did not  occur  Comprehension Comprehension Mode: Auditory Comprehension: 2-Understands basic 25 - 49% of the time/requires cueing 51 - 75% of the time  Expression Expression Mode: Verbal Expression: 2-Expresses basic 25 - 49% of the time/requires cueing 50 - 75% of the time. Uses single words/gestures.  Social Interaction Social Interaction: 4-Interacts appropriately 75 - 89% of the time - Needs redirection for appropriate language or to initiate interaction.  Problem Solving Problem Solving Mode: Not assessed Problem Solving: 2-Solves basic 25 - 49% of the time - needs direction more than half the time to initiate, plan or complete simple activities  Memory Memory: 2-Recognizes or recalls 25 - 49% of the time/requires cueing 51 - 75%  of the time  Medical Problem List and Plan:  1. Embolic large left PCA infarct  2. DVT Prophylaxis/Anticoagulation: Xarelto.  3. Neuropsych: This patient Is not capable of making decisions on his/her own behalf.  4. New onset atrial fibrillation. Continue Cardizem and Toprol as advised.may need to adjust dose  5. Diabetes mellitus with peripheral neuropathy. Hemoglobin A1c 6.1. SSI. gluophage resumed. Fair control at present. 6. Hypertension. Patient on Lotrel 10-20 daily prior to admission as well as hydrochlorothiazide 25 mg daily.   - pt is on toprol and cardizem for rate control with BP borderline  -increase cardizem to 300mg    LOS (Days) 4 A FACE TO FACE EVALUATION WAS PERFORMED  [name] 11/28/2012, 7:49 AM

## 2012-11-28 NOTE — Progress Notes (Signed)
Occupational Therapy Session Note  Patient Details  Name: [name] MRN: [id number] Date of Birth: 1946/07/31  Today's Date: 11/28/2012 Time: 6962-9528 Time Calculation (min): 45 min  Short Term Goals: Week 1:  OT Short Term Goal 1 (Week 1): Bath:  Mod A with LB bath OT Short Term Goal 2 (Week 1): UB Dressing:  Mod Assist OT Short Term Goal 3 (Week 1): LB Dressing:  Max Assist to include stand OT Short Term Goal 4 (Week 1): RUE:  Use RUE at least 50% of the time during BADL tasks with mod cues OT Short Term Goal 5 (Week 1): Vision:  Continue to evaluate patient's vision and begin to address PRN  Skilled Therapeutic Interventions/Progress Updates:  Patient in bed with HOB up and bed table in front of her with a glass of water on the table at midline.  After some encouragement, patient stated that she wanted to work on her right arm today and when showing the movement of right arm she knocked over the glass of water and was completely unaware and did not realize that her bed was completely soaked.  When wet bed was brought to her attention she denied that this occurred because she did not see any water.  Patient was looking to the left side of the bed yet missing the middle and right side of the bed.  Engaged in supine and rolling to replace soiled diaper then HOB up for patient to assist with don pants, supine and roll for pants up then sit EOB to prepare for slide board transfer traveling to patient's left.  Once board was placed, patient was required extra time and mod assist to slide into w/c.  Once leg rests in place, patient is able to assist with scoot back in w/c.  Patient continues to report diplopia yet states she is having a hard time describing other visual deficits.  Patient is able to turn head and eyes past midline and to the right with vcs and able to maintain this position for ~1 min.  Patient up in w/c with all items within reach.  Therapy Documentation Precautions:   Precautions Precautions: Fall Precaution Comments: pain with PROM bil knees and R hip due to OA, aphasia, ?apraxia, visual deficits - ?diplopia, decreased cognition Restrictions Weight Bearing Restrictions: No Other Position/Activity Restrictions: Per sister in room, pt had a bad right hip before this and was to have surgery, this is really painful to her now Pain: Right hip and B knee pain, not rated, RN notified and medication provided. ADL: See FIM for current functional status  Therapy/Group: Individual Therapy  Salina Stanfield 11/28/2012, 4:06 PM

## 2012-11-29 ENCOUNTER — Inpatient Hospital Stay (HOSPITAL_COMMUNITY): Payer: PRIVATE HEALTH INSURANCE

## 2012-11-29 ENCOUNTER — Inpatient Hospital Stay (HOSPITAL_COMMUNITY): Payer: PRIVATE HEALTH INSURANCE | Admitting: Occupational Therapy

## 2012-11-29 ENCOUNTER — Inpatient Hospital Stay (HOSPITAL_COMMUNITY): Payer: Medicare HMO

## 2012-11-29 DIAGNOSIS — I69921 Dysphasia following unspecified cerebrovascular disease: Secondary | ICD-10-CM

## 2012-11-29 DIAGNOSIS — I6340 Cerebral infarction due to embolism of unspecified cerebral artery: Secondary | ICD-10-CM

## 2012-11-29 DIAGNOSIS — G8110 Spastic hemiplegia affecting unspecified side: Secondary | ICD-10-CM

## 2012-11-29 LAB — GLUCOSE, CAPILLARY
Glucose-Capillary: 132 mg/dL — ABNORMAL HIGH (ref 70–99)
Glucose-Capillary: 225 mg/dL — ABNORMAL HIGH (ref 70–99)
Glucose-Capillary: 122 mg/dL — ABNORMAL HIGH (ref 70–99)
Glucose-Capillary: 226 mg/dL — ABNORMAL HIGH (ref 70–99)

## 2012-11-29 MED ORDER — DIPHENHYDRAMINE HCL 25 MG PO CAPS
25.0000 mg | ORAL_CAPSULE | Freq: Three times a day (TID) | ORAL | Status: DC | PRN
  Administered 2012-11-29 – 2012-12-15 (×13): 25 mg via ORAL
  Filled 2012-11-29 (×14): qty 1

## 2012-11-29 NOTE — Progress Notes (Signed)
Pt complaining of skin itching, P Love, PA notified.

## 2012-11-29 NOTE — Progress Notes (Signed)
Speech Language Pathology Daily Session Note  Patient Details  Name: [name] MRN: [id number] Date of Birth: 28-Dec-1945  Today's Date: 11/29/2012 Time: 9147-8295 Time Calculation (min): 45 min  Short Term Goals: Week 1: SLP Short Term Goal 1 (Week 1): Patient will return use of call bell to request help with mod assist verbal and visual cues SLP Short Term Goal 2 (Week 1): Patient will label common objects with mod assist semantic and phonemic cues SLP Short Term Goal 3 (Week 1): Patient will follow 1-step commands with mod assist visual cues SLP Short Term Goal 4 (Week 1): Patient will increase awarenss of perseverative errors with max assist verbal and visual cues.  Skilled Therapeutic Interventions: Skilled treatment session focused on cognitive-linguistic goals. SLP facilitated by providing Max multimodal cues for naming and increased wait time for problem solving. Pt was able to name 2/11 common items independently, requiring phonemic and semantic cues on the remaining 9 items. Pt required verbal and tactile cues 50% of the time with one-step commands using her own body.  Continue current plan of care.   FIM:  Comprehension Comprehension Mode: Auditory Comprehension: 2-Understands basic 25 - 49% of the time/requires cueing 51 - 75% of the time Expression Expression Mode: Verbal Expression: 2-Expresses basic 25 - 49% of the time/requires cueing 50 - 75% of the time. Uses single words/gestures. Social Interaction Social Interaction: 4-Interacts appropriately 75 - 89% of the time - Needs redirection for appropriate language or to initiate interaction. Problem Solving Problem Solving: 2-Solves basic 25 - 49% of the time - needs direction more than half the time to initiate, plan or complete simple activities Memory Memory: 2-Recognizes or recalls 25 - 49% of the time/requires cueing 51 - 75% of the time  Pain Pain Assessment Pain Assessment: No/denies pain  Therapy/Group:  Individual Therapy   Maxcine Ham, M.A. CF-SLP  Maxcine Ham 11/29/2012, 11:16 AM

## 2012-11-29 NOTE — Progress Notes (Addendum)
Physical Therapy Session Note  Patient Details  Name: [name] MRN: [id number] Date of Birth: November 09, 1945  Today's Date: 11/29/2012 Time: 1108-1210 Time Calculation (min): 62 min  Short Term Goals: Week 1:  PT Short Term Goal 1 (Week 1): Pt will move supine > sit with min assist. PT Short Term Goal 2 (Week 1): Pt will transfer bed>< w/c with max assist. PT Short Term Goal 3 (Week 1): Pt will stand x 1 minute with assistance. PT Short Term Goal 4 (Week 1): Pt will demonstrate route finding with mod cues when up in w/c.  Skilled Therapeutic Interventions/Progress Updates:  Pt's daughter stated that pt walked very little in the house, and almost none in the community, PTA, due to L hip and bil knee pain.  Treatment focused on transfers, visual/perceptual tasks, standing.    neuromuscular re-education via auditory cues, VCs, visual cues for trunk flexion/rotation/elongation/shortening  -reaching within BOS with R or L hand.  Mod cues and extra time needed for initiation and motor planning due to visual/perceptual deficits.    -Sit> stand facilitating forward wt shift, bil UE use +2 assist.  Pt stood x 30 seconds, x 1 min 30 seconds +2 assist, focusing on R knee and hip extension.    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, aphasia, ?apraxia, visual deficits - ?diplopia, decreased cognition Restrictions Weight Bearing Restrictions: No Other Position/Activity Restrictions: Per sister in room, pt had a bad right hip before this and was to have surgery, this is really painful to her now  Pain: Pain Assessment Pain Assessment: No/denies pain Pain Score:   4 Pain Type: Chronic pain Pain Location: Hip Pain Orientation: Right Pain Intervention(s): Medication (See eMAR)   Other Treatments: Treatments Neuromuscular Facilitation: Right;Lower Extremity;Upper Extremity;Activity to increase timing  and sequencing;Activity to increase motor control;Activity to increase lateral weight shifting;Activity to increase anterior-posterior weight shifting  See FIM for current functional status  Therapy/Group: Individual Therapy  Velena Keegan 11/29/2012, 12:27 PM

## 2012-11-29 NOTE — Progress Notes (Signed)
Patient ID: [name], female   DOB: 20-Sep-1945, 67 y.o.   MRN: [id number] Subjective/Complaints: 67 y.o. right-handed female with history of hypertension, diabetes mellitus with peripheral neuropathy. Admitted 11/18/2012 with headache and elevated blood pressure 172/127. EKG showed atrial fibrillation with RVR. 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 Echocardiogram with ejection fraction of 65% and no wall motion abnormalities. Carotid Dopplers with no ICA stenosis. Patient did not receive TPA. Started on Cardizem infusion for atrial fibrillation and transition to by mouth. 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 Neurology services consulted maintained on aspirin therapy as well as Xarelto.    Cannot remember my name or occupation  Review of Systems  Unable to perform ROS: medical condition    Objective: Vital Signs: Blood pressure 150/96, pulse 83, temperature 98.6 F (37 C), temperature source Oral, resp. rate 19, height 5\' 3"  (1.6 m), weight 107.9 kg (237 lb 14 oz), SpO2 98.00%. No results found. Results for orders placed during the hospital encounter of 11/24/12 (from the past 72 hour(s))  GLUCOSE, CAPILLARY     Status: Abnormal   Collection Time    11/26/12  7:09 AM      Result Value Range   Glucose-Capillary 127 (*) 70 - 99 mg/dL   Comment 1 Notify RN    GLUCOSE, CAPILLARY     Status: Abnormal   Collection Time    11/26/12 11:05 AM      Result Value Range   Glucose-Capillary 187 (*) 70 - 99 mg/dL   Comment 1 Notify RN    GLUCOSE, CAPILLARY     Status: Abnormal   Collection Time    11/26/12  4:40 PM      Result Value Range   Glucose-Capillary 165 (*) 70 - 99 mg/dL   Comment 1 Notify RN    GLUCOSE, CAPILLARY     Status: Abnormal   Collection Time    11/26/12  8:39 PM      Result Value Range   Glucose-Capillary 162 (*) 70 - 99 mg/dL   Comment 1 Notify RN    GLUCOSE,  CAPILLARY     Status: Abnormal   Collection Time    11/27/12  7:30 AM      Result Value Range   Glucose-Capillary 124 (*) 70 - 99 mg/dL   Comment 1 Notify RN    GLUCOSE, CAPILLARY     Status: Abnormal   Collection Time    11/27/12  1:10 PM      Result Value Range   Glucose-Capillary 177 (*) 70 - 99 mg/dL   Comment 1 Notify RN    GLUCOSE, CAPILLARY     Status: Abnormal   Collection Time    11/27/12  4:52 PM      Result Value Range   Glucose-Capillary 168 (*) 70 - 99 mg/dL   Comment 1 Notify RN    GLUCOSE, CAPILLARY     Status: Abnormal   Collection Time    11/27/12  9:07 PM      Result Value Range   Glucose-Capillary 198 (*) 70 - 99 mg/dL  GLUCOSE, CAPILLARY     Status: Abnormal   Collection Time    11/28/12  7:18 AM      Result Value Range   Glucose-Capillary 139 (*) 70 - 99 mg/dL  GLUCOSE, CAPILLARY     Status: Abnormal   Collection Time    11/28/12 11:57 AM  Result Value Range   Glucose-Capillary 165 (*) 70 - 99 mg/dL  GLUCOSE, CAPILLARY     Status: Abnormal   Collection Time    11/28/12  4:26 PM      Result Value Range   Glucose-Capillary 143 (*) 70 - 99 mg/dL      Vitals reviewed.  Gen: NAD Eyes:  Pupils reactive to light  Neck: Neck supple. No thyromegaly present.  Cardiovascular:  Irreg Irreg, no Murmur Pulmonary/Chest: Effort normal and breath sounds normal.  Abdominal: Bowel sounds are normal. She exhibits no distension.  Neurological: She is alert.  Patient is appropriate to basic conversation during exam. She followed simple commands. She was able to provide her name and age. Noted left gaze preference is persistent. Is able to use left UE however for functional activities Alert.Oriented to self only,Receptive language deficits noted  Dense right homonymous hemianopsia with poor compensation  Motor strength 4+/5 in the right deltoid, biceps, triceps, grip,3-/5 hip flexor knee extensor ankle dorsiflexor plantar flexor  4/5 on the left side. Needs  cues to engage the right side.  Sensory absent LT RUE and RLE, intact on left Mood/Affect:  Appropriate  Assessment/Plan: 1. Functional deficits secondary to Left PCA embolic infarct which require 3+ hours per day of interdisciplinary therapy in a comprehensive inpatient rehab setting. Physiatrist is providing close team supervision and 24 hour management of active medical problems listed below. Physiatrist and rehab team continue to assess barriers to discharge/monitor patient progress toward functional and medical goals. FIM: FIM - Bathing Bathing Steps Patient Completed: Chest;Abdomen;Right Arm;Left Arm;Front perineal area;Right upper leg;Left upper leg Bathing: 3: Mod-Patient completes 5-7 52f 10 parts or 50-74%  FIM - Upper Body Dressing/Undressing Upper body dressing/undressing steps patient completed: Thread/unthread right bra strap;Thread/unthread left bra strap;Thread/unthread right sleeve of pullover shirt/dresss;Thread/unthread left sleeve of pullover shirt/dress;Put head through opening of pull over shirt/dress Upper body dressing/undressing: 0: Wears gown/pajamas-no public clothing FIM - Lower Body Dressing/Undressing Lower body dressing/undressing steps patient completed: Thread/unthread left pants leg Lower body dressing/undressing: 1: Total-Patient completed less than 25% of tasks        FIM - Banker Devices: Sliding board Bed/Chair Transfer: 1: Chair or W/C > Bed: Total A (helper does all/Pt. < 25%);1: Two helpers  FIM - Locomotion: Wheelchair Locomotion: Wheelchair: 0: Activity did not occur FIM - Locomotion: Ambulation Locomotion: Ambulation Assistive Devices: Designer, industrial/product Ambulation/Gait Assistance: 1: +2 Total assist Locomotion: Ambulation: 0: Activity did not occur  Comprehension Comprehension Mode: Auditory Comprehension: 2-Understands basic 25 - 49% of the time/requires cueing 51 - 75% of the  time  Expression Expression Mode: Verbal Expression: 2-Expresses basic 25 - 49% of the time/requires cueing 50 - 75% of the time. Uses single words/gestures.  Social Interaction Social Interaction: 4-Interacts appropriately 75 - 89% of the time - Needs redirection for appropriate language or to initiate interaction.  Problem Solving Problem Solving Mode: Not assessed Problem Solving: 2-Solves basic 25 - 49% of the time - needs direction more than half the time to initiate, plan or complete simple activities  Memory Memory: 2-Recognizes or recalls 25 - 49% of the time/requires cueing 51 - 75% of the time  Medical Problem List and Plan:  1. Embolic large left PCA infarct  2. DVT Prophylaxis/Anticoagulation: Xarelto.  3. Neuropsych: This patient Is not capable of making decisions on his/her own behalf.  4. New onset atrial fibrillation. Continue Cardizem and Toprol as advised.may need to adjust dose  5. Diabetes mellitus  with peripheral neuropathy. Hemoglobin A1c 6.1. SSI. gluophage resumed. Fair control at present. 6. Hypertension. Patient on Lotrel 10-20 daily prior to admission as well as hydrochlorothiazide 25 mg daily.   - pt is on toprol and cardizem for rate control with BP borderline  -increase cardizem to 300mg    LOS (Days) 5 A FACE TO FACE EVALUATION WAS PERFORMED  Ashantae Pangallo E 11/29/2012, 6:58 AM

## 2012-11-29 NOTE — Progress Notes (Signed)
Occupational Therapy Session Notes  Patient Details  Name: [name] MRN: [id number] Date of Birth: [birth year]  Today's Date: 11/29/2012 Time: 0800-0900 and 230-305 Time Calculation (min): 60 min and 35 min  Short Term Goals: Week 1:  OT Short Term Goal 1 (Week 1): Bath:  Mod A with LB bath OT Short Term Goal 2 (Week 1): UB Dressing:  Mod Assist OT Short Term Goal 3 (Week 1): LB Dressing:  Max Assist to include stand OT Short Term Goal 4 (Week 1): RUE:  Use RUE at least 50% of the time during BADL tasks with mod cues OT Short Term Goal 5 (Week 1): Vision:  Continue to evaluate patient's vision and begin to address PRN  Skilled Therapeutic Interventions/Progress Updates:  1)  Self care retraining to include bath, dress and transfer to w/c.  Focused session on visual attention to right visual field, forced use of and attention to RUE, bed mobility, long sitting to transition to sitting EOB with slow movements, slide board transfer +2 to steady board and w/c.  2)  Patient up in w/c and engaged in attempt to get a clearer picture of her visual, perceptual and sensory deficits.  Patients communication deficits too severe to fully gain understanding as she is not able to follow directions completely.  Patient does perform BADLs and functional mobility best when given additional time to process and position BLEs to limit pain.  At end of session, NT stated that she needed to know if patient had soiled her diaper.  Patient was given the choice of standing to check and change if soiled or back to bed for the task.  Patient chose to stand for the task.  W/C was positioned so that patient could hold the bed rail as a grab bar and assisted to stand while 2 NTs lowered her pants, removed the soiled brief, the replaced the brief and pulled up pants.  Patient tolerated this well except reports some pain in BLEs once she sat back down secondary to increased knee flexion.  Therapy Documentation Precautions:   Precautions Precautions: Fall Precaution Comments: pain with PROM bil knees and R hip due to OA, aphasia, ?apraxia, visual deficits - ?diplopia, decreased cognition Restrictions Weight Bearing Restrictions: No Other Position/Activity Restrictions: Per sister in room, pt had a bad right hip before this and was to have surgery, this is really painful to her now Pain: 1) only reports pain in right hip and bilateral knees if assisted with mobility without moving slowly  2)  Only pain reported was momentarily in the bilateral knees and occurred after stand>sit ADL: See FIM for current functional status  Therapy/Group: Individual Therapy  Mikelle Myrick 11/29/2012, 10:19 AM

## 2012-11-30 ENCOUNTER — Inpatient Hospital Stay (HOSPITAL_COMMUNITY): Payer: PRIVATE HEALTH INSURANCE | Admitting: Speech Pathology

## 2012-11-30 ENCOUNTER — Inpatient Hospital Stay (HOSPITAL_COMMUNITY): Payer: PRIVATE HEALTH INSURANCE | Admitting: Occupational Therapy

## 2012-11-30 ENCOUNTER — Inpatient Hospital Stay (HOSPITAL_COMMUNITY): Payer: PRIVATE HEALTH INSURANCE | Admitting: Physical Therapy

## 2012-11-30 LAB — BASIC METABOLIC PANEL
BUN: 36 mg/dL — ABNORMAL HIGH (ref 6–23)
Calcium: 9.5 mg/dL (ref 8.4–10.5)
Creatinine, Ser: 1.69 mg/dL — ABNORMAL HIGH (ref 0.50–1.10)
GFR calc Af Amer: 36 mL/min — ABNORMAL LOW (ref >90)

## 2012-11-30 LAB — GLUCOSE, CAPILLARY
Glucose-Capillary: 159 mg/dL — ABNORMAL HIGH (ref 70–99)
Glucose-Capillary: 189 mg/dL — ABNORMAL HIGH (ref 70–99)

## 2012-11-30 NOTE — Progress Notes (Signed)
Speech Language Pathology Daily Session Note  Patient Details  Name: [name] MRN: [id number] Date of Birth: [birth year]  Today's Date: 11/30/2012 Time: 4098-1191 Time Calculation (min): 45 min  Short Term Goals: Week 1: SLP Short Term Goal 1 (Week 1): Patient will return use of call bell to request help with mod assist verbal and visual cues SLP Short Term Goal 2 (Week 1): Patient will label common objects with mod assist semantic and phonemic cues SLP Short Term Goal 3 (Week 1): Patient will follow 1-step commands with mod assist visual cues SLP Short Term Goal 4 (Week 1): Patient will increase awarenss of perseverative errors with max assist verbal and visual cues.  Skilled Therapeutic Interventions: Skilled treatment session focused on addressing cognitive-linguistic goals. SLP facilitated by providing objects at midline, increased wait time and max semantic and phonemic cues for naming familiar objects; SLP able to fade phonemic cues x1 with accuracy maintained.  Without phonemic cues patient demonstrated semantic errors in 9/10 trials.  Patient verbally explained itching of right palm with numerous semantic and directional errors with no awareness of errors.  SLP was able to determine what was bothering patient due to watching her response over listening to her verbal expression.  RN was notified and SLP educated her regarding patient's deficits.  Continue current plan of care.   FIM:  Comprehension Comprehension Mode: Auditory Comprehension: 2-Understands basic 25 - 49% of the time/requires cueing 51 - 75% of the time Expression Expression Mode: Verbal Expression: 2-Expresses basic 25 - 49% of the time/requires cueing 50 - 75% of the time. Uses single words/gestures. Social Interaction Social Interaction: 4-Interacts appropriately 75 - 89% of the time - Needs redirection for appropriate language or to initiate interaction. Problem Solving Problem Solving: 2-Solves basic 25 - 49%  of the time - needs direction more than half the time to initiate, plan or complete simple activities Memory Memory: 2-Recognizes or recalls 25 - 49% of the time/requires cueing 51 - 75% of the time  Pain Pain Assessment Pain Assessment: No/denies pain (reports itching; RN notified)  Therapy/Group: Individual Therapy  Charlane Ferretti., CCC-SLP [phone]  [name] 11/30/2012, 11:13 AM

## 2012-11-30 NOTE — Progress Notes (Signed)
Physical Therapy Note  Patient Details  Name: Jaquasia Doscher MRN: [id number] Date of Birth: [birth year] Today's Date: 11/30/2012  1115-1200 (45) individual ([place] with OT) Pain: Pt reports unrated pain RT hip in weightbearing Focus of treatment: sit to stand/ standing tolerance with appropriate AD; transfer training (stand/turn) with EVA walker Treatment: Pt up in [facility]; sit to stand X 2 pulling up on rail on steps max assist (pt requires max tactile hand over hand cues to locate objects on right) . Pt tolerates standing for 3 minutes with tactile cues to facilitate trunk extension; stand/turn to left wc >< mat X 2 using Eva walker mod/max assist +2 for safety with max vcs to step on right; sit to stand X 1 to STEDY mod/max assist for 2 minutes with cues as above for erect stance.   1500-1525 (25 minutes) individual Pain: no complaint of pain this PM Focus of treatment: sit to stand/ standing tolerance/alignment Treatment: Pt up in wc ; sit to stand x 2 to EVA walker mod/max assist; standing to EVA walker x 2 for 2-3 minutes with flexed left knee and tactile cues for trunk/hip extension min assist.. No reported pain RT hip in standing.   Maryela Tapper,JIM 11/30/2012, 11:58 AM

## 2012-11-30 NOTE — Progress Notes (Signed)
Occupational Therapy Session Note  Patient Details  Name: [name] MRN: [id number] Date of Birth: May 09, 1946  Today's Date: 11/30/2012 Time: 1100-1115 Time Calculation (min): 15 min  Skilled Therapeutic Interventions/Progress Updates:    Co-tx with PT during this session.  Focus of treatment on standing balance, transfers, and standing endurance.  Pt needing total assist +2 (pt 30%) for sit to stand from wheelchair.  Needs max demonstrational cueing for hand placement on bar to help with pulling up or when attempting to sit, having her reach back.  Pt internally distracted at times during session with reports of being cold.  Worked on visual scanning to the right as well for locking wheelchair brakes before standing.  Pt needing mod facilitation to complete secondary to not being able to see them.  Placed bright red tape over brake handles as well to help with discrimination.  Pt able to stand X 3 for 2-4 minute intervals with bilateral UE support but is limited secondary to right hip pain.  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, aphasia, ?apraxia, visual deficits - ?diplopia, decreased cognition Restrictions Weight Bearing Restrictions: No Other Position/Activity Restrictions: Per sister in room, pt had a bad right hip before this and was to have surgery, this is really painful to her now  Pain: Pain Assessment Pain Assessment: Faces Pain Score:   4 Faces Pain Scale: Hurts little more Pain Type: Chronic pain Pain Location: Hip Pain Orientation: Right Pain Intervention(s): Medication (See eMAR);Repositioned ADL: See FIM for current functional status  Therapy/Group: Individual Therapy  Kaitlinn Iversen OTR/L 11/30/2012, 12:26 PM

## 2012-11-30 NOTE — Progress Notes (Signed)
Occupational Therapy Session Note  Patient Details  Name: [name] MRN: [id number] Date of Birth: 1945-11-19  Today's Date: 11/30/2012 Time: 1000-1100 Time Calculation (min): 60 min  Short Term Goals: Week 1:  OT Short Term Goal 1 (Week 1): Bath:  Mod A with LB bath OT Short Term Goal 2 (Week 1): UB Dressing:  Mod Assist OT Short Term Goal 3 (Week 1): LB Dressing:  Max Assist to include stand OT Short Term Goal 4 (Week 1): RUE:  Use RUE at least 50% of the time during BADL tasks with mod cues OT Short Term Goal 5 (Week 1): Vision:  Continue to evaluate patient's vision and begin to address PRN  Skilled Therapeutic Interventions/Progress Updates:  Self care retraining to include bath, dress, transfer to w/c and groom at sink.  Focused session on activity tolerance, attention to right visual field and to RUE, forced use of RUE, functional mobility to include rolling R/L, transition to sit EOB, scooting forward to EOB and backwards in w/c, scoot/slide board transfers +2 for safety only as patient only required Min-Mod assist to travel on board from bed>w/c.  Groom tasks at sink.  Patient attempted to use dominant RUE to brush teeth with awkward movements and decreased thoroughness. After transfer to w/c patient stated,"I don't like this".  After further investigation, patient not happy about the amount of work she has to do however she is not ready to give up.  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, aphasia, ?apraxia, visual deficits - ?diplopia, decreased cognition Restrictions Weight Bearing Restrictions: No Other Position/Activity Restrictions: Per sister in room, pt had a bad right hip before this and was to have surgery, this is really painful to her now Pain: Pain Assessment Pain Assessment: Faces Faces Pain Scale: Hurts little more Pain Type: Chronic pain Pain Location: Hip Pain  Orientation: Right Pain Intervention(s): Medication (See eMAR);Repositioned ADL: See FIM for current functional status  Therapy/Group: Individual Therapy  Dylana Shaw 11/30/2012, 1:23 PM

## 2012-11-30 NOTE — Progress Notes (Signed)
Patient ID: [name], female   DOB: 1946/02/15, 67 y.o.   MRN: [id number] Subjective/Complaints: 67 y.o. right-handed female with history of hypertension, diabetes mellitus with peripheral neuropathy. Admitted 11/18/2012 with headache and elevated blood pressure 172/127. EKG showed atrial fibrillation with RVR. 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 Echocardiogram with ejection fraction of 65% and no wall motion abnormalities. Carotid Dopplers with no ICA stenosis. Patient did not receive TPA. Started on Cardizem infusion for atrial fibrillation and transition to by mouth. 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 Neurology services consulted maintained on aspirin therapy as well as Xarelto.  Oriented to [facility]  Review of Systems  Unable to perform ROS: medical condition    Objective: Vital Signs: Blood pressure 144/84, pulse 86, temperature 98.3 F (36.8 C), temperature source Oral, resp. rate 18, height 5\' 3"  (1.6 m), weight 107.9 kg (237 lb 14 oz), SpO2 97.00%. No results found. Results for orders placed during the hospital encounter of 11/24/12 (from the past 72 hour(s))  GLUCOSE, CAPILLARY     Status: Abnormal   Collection Time    11/27/12  7:30 AM      Result Value Range   Glucose-Capillary 124 (*) 70 - 99 mg/dL   Comment 1 Notify RN    GLUCOSE, CAPILLARY     Status: Abnormal   Collection Time    11/27/12  1:10 PM      Result Value Range   Glucose-Capillary 177 (*) 70 - 99 mg/dL   Comment 1 Notify RN    GLUCOSE, CAPILLARY     Status: Abnormal   Collection Time    11/27/12  4:52 PM      Result Value Range   Glucose-Capillary 168 (*) 70 - 99 mg/dL   Comment 1 Notify RN    GLUCOSE, CAPILLARY     Status: Abnormal   Collection Time    11/27/12  9:07 PM      Result Value Range   Glucose-Capillary 198 (*) 70 - 99 mg/dL  GLUCOSE, CAPILLARY     Status: Abnormal   Collection Time     11/28/12  7:18 AM      Result Value Range   Glucose-Capillary 139 (*) 70 - 99 mg/dL  GLUCOSE, CAPILLARY     Status: Abnormal   Collection Time    11/28/12 11:57 AM      Result Value Range   Glucose-Capillary 165 (*) 70 - 99 mg/dL  GLUCOSE, CAPILLARY     Status: Abnormal   Collection Time    11/28/12  4:26 PM      Result Value Range   Glucose-Capillary 143 (*) 70 - 99 mg/dL  GLUCOSE, CAPILLARY     Status: Abnormal   Collection Time    11/28/12  9:58 PM      Result Value Range   Glucose-Capillary 184 (*) 70 - 99 mg/dL   Comment 1 Notify RN    GLUCOSE, CAPILLARY     Status: Abnormal   Collection Time    11/29/12  7:10 AM      Result Value Range   Glucose-Capillary 132 (*) 70 - 99 mg/dL   Comment 1 Notify RN    GLUCOSE, CAPILLARY     Status: Abnormal   Collection Time    11/29/12 11:30 AM      Result Value Range   Glucose-Capillary 225 (*) 70 - 99 mg/dL   Comment 1  Notify RN    GLUCOSE, CAPILLARY     Status: Abnormal   Collection Time    11/29/12  4:59 PM      Result Value Range   Glucose-Capillary 122 (*) 70 - 99 mg/dL  GLUCOSE, CAPILLARY     Status: Abnormal   Collection Time    11/29/12  8:58 PM      Result Value Range   Glucose-Capillary 226 (*) 70 - 99 mg/dL      Vitals reviewed.  Gen: NAD Eyes:  Pupils reactive to light  Neck: Neck supple. No thyromegaly present.  Cardiovascular:  Irreg Irreg, no Murmur Pulmonary/Chest: Effort normal and breath sounds normal.  Abdominal: Bowel sounds are normal. She exhibits no distension.  Neurological: She is alert.  Patient is appropriate to basic conversation during exam. She followed simple commands. She was able to provide her name and age. Noted left gaze preference is persistent. Is able to use left UE however for functional activities Alert.Oriented to self only,Receptive language deficits noted  Dense right homonymous hemianopsia with poor compensation  Motor strength 4+/5 in the right deltoid, biceps, triceps,  grip,3-/5 hip flexor knee extensor ankle dorsiflexor plantar flexor  4/5 on the left side. Needs cues to engage the right side.  Sensory absent LT RUE and RLE, intact on left Mood/Affect:  Appropriate  Assessment/Plan: 1. Functional deficits secondary to Left PCA embolic infarct which require 3+ hours per day of interdisciplinary therapy in a comprehensive inpatient rehab setting. Physiatrist is providing close team supervision and 24 hour management of active medical problems listed below. Physiatrist and rehab team continue to assess barriers to discharge/monitor patient progress toward functional and medical goals. FIM: FIM - Bathing Bathing Steps Patient Completed: Chest;Abdomen;Right Arm;Left Arm;Front perineal area;Right upper leg;Left upper leg Bathing: 3: Mod-Patient completes 5-7 97f 10 parts or 50-74%  FIM - Upper Body Dressing/Undressing Upper body dressing/undressing steps patient completed: Thread/unthread right bra strap;Thread/unthread left bra strap;Thread/unthread right sleeve of pullover shirt/dresss;Thread/unthread left sleeve of pullover shirt/dress;Put head through opening of pull over shirt/dress Upper body dressing/undressing: 0: Wears gown/pajamas-no public clothing FIM - Lower Body Dressing/Undressing Lower body dressing/undressing steps patient completed: Thread/unthread left pants leg Lower body dressing/undressing: 1: Total-Patient completed less than 25% of tasks        FIM - Architectural technologist Transfer: 4: Chair or W/C > Bed: Min A (steadying Pt. > 75%);3: Bed > Chair or W/C: Mod A (lift or lower assist)  FIM - Locomotion: Wheelchair Locomotion: Wheelchair: 1: Total Assistance/staff pushes wheelchair (Pt<25%) FIM - Locomotion: Ambulation Locomotion: Ambulation Assistive Devices: Designer, industrial/product Ambulation/Gait Assistance: 1: +2 Total assist Locomotion: Ambulation: 0: Activity did not  occur  Comprehension Comprehension Mode: Auditory Comprehension: 2-Understands basic 25 - 49% of the time/requires cueing 51 - 75% of the time  Expression Expression Mode: Verbal Expression: 2-Expresses basic 25 - 49% of the time/requires cueing 50 - 75% of the time. Uses single words/gestures.  Social Interaction Social Interaction: 4-Interacts appropriately 75 - 89% of the time - Needs redirection for appropriate language or to initiate interaction.  Problem Solving Problem Solving Mode: Not assessed Problem Solving: 2-Solves basic 25 - 49% of the time - needs direction more than half the time to initiate, plan or complete simple activities  Memory Memory: 2-Recognizes or recalls 25 - 49% of the time/requires cueing 51 - 75% of the time  Medical Problem List and Plan:  1. Embolic large left PCA infarct  2.  DVT Prophylaxis/Anticoagulation: Xarelto.  3. Neuropsych: This patient Is not capable of making decisions on his/her own behalf.  4. New onset atrial fibrillation. Continue Cardizem and Toprol as advised.may need to adjust dose  5. Diabetes mellitus with peripheral neuropathy. Hemoglobin A1c 6.1. SSI. gluophage on hold for elevated Creat. Recheck BMET Fair control at present. 6. Hypertension. Patient on Lotrel 10-20 daily prior to admission as well as hydrochlorothiazide 25 mg daily.   - pt is on toprol and cardizem for rate control with BP borderline  -increased cardizem to 300mg -BP and HR responding   LOS (Days) 6 A FACE TO FACE EVALUATION WAS PERFORMED  Camarion Weier E 11/30/2012, 6:53 AM

## 2012-12-01 ENCOUNTER — Encounter (HOSPITAL_COMMUNITY): Payer: PRIVATE HEALTH INSURANCE | Admitting: Occupational Therapy

## 2012-12-01 ENCOUNTER — Inpatient Hospital Stay (HOSPITAL_COMMUNITY): Payer: PRIVATE HEALTH INSURANCE | Admitting: Occupational Therapy

## 2012-12-01 ENCOUNTER — Inpatient Hospital Stay (HOSPITAL_COMMUNITY): Payer: PRIVATE HEALTH INSURANCE | Admitting: Speech Pathology

## 2012-12-01 ENCOUNTER — Inpatient Hospital Stay (HOSPITAL_COMMUNITY): Payer: PRIVATE HEALTH INSURANCE

## 2012-12-01 LAB — GLUCOSE, CAPILLARY
Glucose-Capillary: 162 mg/dL — ABNORMAL HIGH (ref 70–99)
Glucose-Capillary: 154 mg/dL — ABNORMAL HIGH (ref 70–99)

## 2012-12-01 LAB — TSH: TSH: 1.649 u[IU]/mL (ref 0.350–4.500)

## 2012-12-01 MED ORDER — TRAMADOL HCL 50 MG PO TABS
50.0000 mg | ORAL_TABLET | Freq: Four times a day (QID) | ORAL | Status: DC
  Administered 2012-12-01 – 2012-12-09 (×31): 50 mg via ORAL
  Filled 2012-12-01 (×30): qty 1

## 2012-12-01 NOTE — Progress Notes (Signed)
Physical Therapy Note  Patient Details  Name: [name] MRN: [id number] Date of Birth: 1945-12-14 Today's Date: 12/01/2012  2:25 - 2:55 30 minutes Individual Session Patient denies pain.  Treatment session focused on wheelchair mobility, LE active exercise, sit to stand and static standing. Patient sitting in wheelchair upon entering room. Patient propelled wheelchair 10 feet with moderate physical and verbal cueing. Patient required maximal tactile and facitatory cueing due to visual issues. Patient unable to find wheelchair brakes, rims, footrests, etc. Patient performed active ankle dorsiflexion and knee extension exercises x 10 reps sitting in wheelchair. Patient sit to stand to rolling walker with mod assist. Patient maintained standing 2.5 minutes with min contact assist and RW. Attempted to have patient take steps forward and back. Patient unable to un-weight one side to take a step. Patient left in wheelchair with quick release belt and all items in reach on left side. Patient reports not seeing items on right.     Arelia Longest M 12/01/2012, 3:52 PM

## 2012-12-01 NOTE — Progress Notes (Signed)
Speech Language Pathology Daily Session Note  Patient Details  Name: [name] MRN: [id number] Date of Birth: 1946/04/30  Today's Date: 12/01/2012 Time: 0900-0930 Time Calculation (min): 30 min  Short Term Goals: Week 1: SLP Short Term Goal 1 (Week 1): Patient will return use of call bell to request help with mod assist verbal and visual cues SLP Short Term Goal 2 (Week 1): Patient will label common objects with mod assist semantic and phonemic cues SLP Short Term Goal 3 (Week 1): Patient will follow 1-step commands with mod assist visual cues SLP Short Term Goal 4 (Week 1): Patient will increase awarenss of perseverative errors with max assist verbal and visual cues.  Skilled Therapeutic Interventions: Skilled treatment session focused on addressing cognitive-linguistic goals. SLP facilitated by providing objects at midline, increased wait time and Mod semantic cues for naming familiar objects with 83% accuracy. Pt also followed 1 step directions that incorporated Right/Left discrimination with extra time and Min A question cues with 100% accuracy.  Pt was able to verbally describe current physical deficits with extra time, gestures and Min A question cues.    FIM:  Comprehension Comprehension Mode: Auditory Comprehension: 3-Understands basic 50 - 74% of the time/requires cueing 25 - 50%  of the time Expression Expression: 3-Expresses basic 50 - 74% of the time/requires cueing 25 - 50% of the time. Needs to repeat parts of sentences. Social Interaction Social Interaction: 4-Interacts appropriately 75 - 89% of the time - Needs redirection for appropriate language or to initiate interaction. Problem Solving Problem Solving: 2-Solves basic 25 - 49% of the time - needs direction more than half the time to initiate, plan or complete simple activities Memory Memory: 2-Recognizes or recalls 25 - 49% of the time/requires cueing 51 - 75% of the time  Pain Pain Assessment Pain Assessment:  No/denies pain  Therapy/Group: Individual Therapy  Demico Ploch 12/01/2012, 11:10 AM

## 2012-12-01 NOTE — Progress Notes (Signed)
Occupational Therapy Session Note  Patient Details  Name: [name] MRN: [id number] Date of Birth: October 05, 1945  Today's Date: 12/01/2012 Time: 7829-5621 Time Calculation (min): 40 min   Skilled Therapeutic Interventions/Progress Updates: Patient seen for OT session this am to address her ability to complete functional transfers via stand step method.  Patient able to stand from bed height with assist of two people, once standing, patient at times required only minimal assistance.  Patient able to advance both feet when utilizing a walker and constant mod assist.       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, aphasia, ?apraxia, visual deficits - ?diplopia, decreased cognition Restrictions Weight Bearing Restrictions: No Other Position/Activity Restrictions: Per sister in room, pt had a bad right hip before this and was to have surgery, this is really painful to her now    Pain: Right knee and hip with all mobility    See FIM for current functional status  Therapy/Group: Individual Therapy  Collier Salina 12/01/2012, 11:23 AM

## 2012-12-01 NOTE — Progress Notes (Signed)
Patient ID: [name], female   DOB: 1945-08-27, 67 y.o.   MRN: [id number] Subjective/Complaints: 67 y.o. right-handed female with history of hypertension, diabetes mellitus with peripheral neuropathy. Admitted 11/18/2012 with headache and elevated blood pressure 172/127. EKG showed atrial fibrillation with RVR. 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 Echocardiogram with ejection fraction of 65% and no wall motion abnormalities. Carotid Dopplers with no ICA stenosis. Patient did not receive TPA. Started on Cardizem infusion for atrial fibrillation and transition to by mouth. 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 Neurology services consulted maintained on aspirin therapy as well as Xarelto.  Oriented to [facility], aphasic  Review of Systems  Unable to perform ROS: medical condition    Objective: Vital Signs: Blood pressure 151/89, pulse 81, temperature 97.6 F (36.4 C), temperature source Oral, resp. rate 18, height 5\' 3"  (1.6 m), weight 107.9 kg (237 lb 14 oz), SpO2 96.00%. No results found. Results for orders placed during the hospital encounter of 11/24/12 (from the past 72 hour(s))  GLUCOSE, CAPILLARY     Status: Abnormal   Collection Time    11/28/12  7:18 AM      Result Value Range   Glucose-Capillary 139 (*) 70 - 99 mg/dL  GLUCOSE, CAPILLARY     Status: Abnormal   Collection Time    11/28/12 11:57 AM      Result Value Range   Glucose-Capillary 165 (*) 70 - 99 mg/dL  GLUCOSE, CAPILLARY     Status: Abnormal   Collection Time    11/28/12  4:26 PM      Result Value Range   Glucose-Capillary 143 (*) 70 - 99 mg/dL  GLUCOSE, CAPILLARY     Status: Abnormal   Collection Time    11/28/12  9:58 PM      Result Value Range   Glucose-Capillary 184 (*) 70 - 99 mg/dL   Comment 1 Notify RN    GLUCOSE, CAPILLARY     Status: Abnormal   Collection Time    11/29/12  7:10 AM      Result Value  Range   Glucose-Capillary 132 (*) 70 - 99 mg/dL   Comment 1 Notify RN    GLUCOSE, CAPILLARY     Status: Abnormal   Collection Time    11/29/12 11:30 AM      Result Value Range   Glucose-Capillary 225 (*) 70 - 99 mg/dL   Comment 1 Notify RN    GLUCOSE, CAPILLARY     Status: Abnormal   Collection Time    11/29/12  4:59 PM      Result Value Range   Glucose-Capillary 122 (*) 70 - 99 mg/dL  GLUCOSE, CAPILLARY     Status: Abnormal   Collection Time    11/29/12  8:58 PM      Result Value Range   Glucose-Capillary 226 (*) 70 - 99 mg/dL  GLUCOSE, CAPILLARY     Status: Abnormal   Collection Time    11/30/12  7:14 AM      Result Value Range   Glucose-Capillary 159 (*) 70 - 99 mg/dL   Comment 1 Notify RN    BASIC METABOLIC PANEL     Status: Abnormal   Collection Time    11/30/12  9:37 AM      Result Value Range   Sodium 139  135 - 145 mEq/L   Potassium 4.3  3.5 - 5.1 mEq/L  Chloride 103  96 - 112 mEq/L   CO2 27  19 - 32 mEq/L   Glucose, Bld 211 (*) 70 - 99 mg/dL   BUN 36 (*) 6 - 23 mg/dL   Creatinine, Ser 1.19 (*) 0.50 - 1.10 mg/dL   Calcium 9.5  8.4 - 14.7 mg/dL   GFR calc non Af Amer 31 (*) >90 mL/min   GFR calc Af Amer 36 (*) >90 mL/min   Comment:            The eGFR has been calculated     using the CKD EPI equation.     This calculation has not been     validated in all clinical     situations.     eGFR's persistently     <90 mL/min signify     possible Chronic Kidney Disease.  GLUCOSE, CAPILLARY     Status: Abnormal   Collection Time    11/30/12 12:02 PM      Result Value Range   Glucose-Capillary 159 (*) 70 - 99 mg/dL   Comment 1 Notify RN    GLUCOSE, CAPILLARY     Status: Abnormal   Collection Time    11/30/12  5:08 PM      Result Value Range   Glucose-Capillary 189 (*) 70 - 99 mg/dL  GLUCOSE, CAPILLARY     Status: Abnormal   Collection Time    11/30/12  8:39 PM      Result Value Range   Glucose-Capillary 176 (*) 70 - 99 mg/dL      Vitals reviewed.   Gen: NAD Eyes:  Pupils reactive to light  Neck: Neck supple. No thyromegaly present.  Cardiovascular:  Irreg Irreg, no Murmur Pulmonary/Chest: Effort normal and breath sounds normal.  Abdominal: Bowel sounds are normal. She exhibits no distension.  Neurological: She is alert.  Patient is appropriate to basic conversation during exam. She followed simple commands. She was able to provide her name and age. Noted left gaze preference is persistent. Is able to use left UE however for functional activities Alert.Oriented to self only,Receptive language deficits noted  Dense right homonymous hemianopsia with poor compensation  Motor strength 4+/5 in the right deltoid, biceps, triceps, grip,3-/5 hip flexor knee extensor ankle dorsiflexor plantar flexor  4/5 on the left side. Needs cues to engage the right side.  Sensory absent LT RUE and RLE, intact on left Mood/Affect:  Appropriate  Assessment/Plan: 1. Functional deficits secondary to Left PCA embolic infarct which require 3+ hours per day of interdisciplinary therapy in a comprehensive inpatient rehab setting. Physiatrist is providing close team supervision and 24 hour management of active medical problems listed below. Physiatrist and rehab team continue to assess barriers to discharge/monitor patient progress toward functional and medical goals. FIM: FIM - Bathing Bathing Steps Patient Completed: Chest;Abdomen;Right Arm;Left Arm Bathing: 2: Max-Patient completes 3-4 60f 10 parts or 25-49% (HOB up and supine and roll)  FIM - Upper Body Dressing/Undressing Upper body dressing/undressing steps patient completed: Thread/unthread right bra strap;Thread/unthread left bra strap;Thread/unthread right sleeve of pullover shirt/dresss;Thread/unthread left sleeve of pullover shirt/dress;Put head through opening of pull over shirt/dress Upper body dressing/undressing: 4: Min-Patient completed 75 plus % of tasks FIM - Lower Body  Dressing/Undressing Lower body dressing/undressing steps patient completed: Thread/unthread left pants leg;Don/Doff left sock Lower body dressing/undressing: 1: Total-Patient completed less than 25% of tasks (long sitting and supine with rolling)        FIM - Bed/Chair Transfer Bed/Chair Transfer Assistive Devices: Sliding board;Arm  rests Bed/Chair Transfer: 3: Bed > Chair or W/C: Mod A (lift or lower assist);1: Two helpers  FIM - Locomotion: Wheelchair Locomotion: Wheelchair: 1: Total Assistance/staff pushes wheelchair (Pt<25%) FIM - Locomotion: Ambulation Locomotion: Ambulation Assistive Devices: Designer, industrial/product Ambulation/Gait Assistance: 1: +2 Total assist Locomotion: Ambulation: 0: Activity did not occur  Comprehension Comprehension Mode: Auditory Comprehension: 2-Understands basic 25 - 49% of the time/requires cueing 51 - 75% of the time  Expression Expression Mode: Verbal Expression: 2-Expresses basic 25 - 49% of the time/requires cueing 50 - 75% of the time. Uses single words/gestures.  Social Interaction Social Interaction: 4-Interacts appropriately 75 - 89% of the time - Needs redirection for appropriate language or to initiate interaction.  Problem Solving Problem Solving Mode: Not assessed Problem Solving: 2-Solves basic 25 - 49% of the time - needs direction more than half the time to initiate, plan or complete simple activities  Memory Memory: 2-Recognizes or recalls 25 - 49% of the time/requires cueing 51 - 75% of the time  Medical Problem List and Plan:  1. Embolic large left PCA infarct  2. DVT Prophylaxis/Anticoagulation: Xarelto.  3. Neuropsych: This patient Is not capable of making decisions on his/her own behalf.  4. New onset atrial fibrillation. Continue Cardizem and Toprol as advised.may need to adjust dose  5. Diabetes mellitus with peripheral neuropathy. Hemoglobin A1c 6.1. SSI. gluophage on hold for elevated Creat. Recheck BMET Fair control at  present. 6. Hypertension. Patient on Lotrel 10-20 daily prior to admission as well as hydrochlorothiazide 25 mg daily.   - pt is on toprol and cardizem for rate control with BP borderline  -increased cardizem to 300mg -BP and HR improving but not in range, if no better in 1-2 days will increase to 360mg    LOS (Days) 7 A FACE TO FACE EVALUATION WAS PERFORMED  [name] 12/01/2012, 7:01 AM

## 2012-12-01 NOTE — Progress Notes (Signed)
Speech Language Pathology Daily Session Note  Patient Details  Name: [name] MRN: [id number] Date of Birth: [birth year]  Today's Date: 12/01/2012 Time: 1315-1330 Time Calculation (min): 15 min  Short Term Goals: Week 1: SLP Short Term Goal 1 (Week 1): Patient will return use of call bell to request help with mod assist verbal and visual cues SLP Short Term Goal 2 (Week 1): Patient will label common objects with mod assist semantic and phonemic cues SLP Short Term Goal 3 (Week 1): Patient will follow 1-step commands with mod assist visual cues SLP Short Term Goal 4 (Week 1): Patient will increase awarenss of perseverative errors with max assist verbal and visual cues.  Skilled Therapeutic Interventions: Upon entering room patient was finishing lunch as request SLP some back in a few minutes.  Skilled treatment session focused on addressing language goals. SLP facilitated task by providing 1 step verbal directions with 83% accuracy. Pt required repeat and re-wording for 100% accuracy.  Overall, spontaneous speech continues to be fluent with accurate general statements and semantic par aphasias with no awareness.     FIM:  Comprehension Comprehension Mode: Auditory Comprehension: 3-Understands basic 50 - 74% of the time/requires cueing 25 - 50%  of the time Expression Expression Mode: Verbal Expression: 3-Expresses basic 50 - 74% of the time/requires cueing 25 - 50% of the time. Needs to repeat parts of sentences. Social Interaction Social Interaction: 4-Interacts appropriately 75 - 89% of the time - Needs redirection for appropriate language or to initiate interaction. Problem Solving Problem Solving: 2-Solves basic 25 - 49% of the time - needs direction more than half the time to initiate, plan or complete simple activities Memory Memory: 2-Recognizes or recalls 25 - 49% of the time/requires cueing 51 - 75% of the time FIM - Eating Eating Activity: 5: Supervision/cues  Pain Pain  Assessment Pain Assessment: No/denies pain  Therapy/Group: Individual Therapy  [name]., CCC-SLP [phone]  [name] 12/01/2012, 4:56 PM

## 2012-12-01 NOTE — Progress Notes (Signed)
Physical Therapy Weekly Progress Note  Patient Details  Name: [name] MRN: [id number] Date of Birth: 1946/05/15  Today's Date: 12/01/2012 Time: 7829-5621 Time Calculation (min): 60 min  Patient has met 3 of 4 short term goals.    Patient continues to demonstrate the following deficits: visual and perceptual, motor control, balance, activity tolerance, dependence for functional mobility and therefore will continue to benefit from skilled PT intervention to enhance overall performance with activity tolerance, balance, ability to compensate for deficits, functional use of  right upper extremity and right lower extremity, attention and awareness.  Patient progressing toward long term goals..  Continue plan of care.  PT Short Term Goals Week 1:  PT Short Term Goal 1 (Week 1): Pt will move supine > sit with min assist. PT Short Term Goal 1 - Progress (Week 1): Met PT Short Term Goal 2 (Week 1): Pt will transfer bed>< w/c with max assist. PT Short Term Goal 2 - Progress (Week 1): Met PT Short Term Goal 3 (Week 1): Pt will stand x 1 minute with assistance. PT Short Term Goal 3 - Progress (Week 1): Met PT Short Term Goal 4 (Week 1): Pt will demonstrate route finding with mod cues when up in w/c. PT Short Term Goal 4 - Progress (Week 1): Not met  Skilled Therapeutic Interventions/Progress Updates:  Treatment focused on neuromuscular re-education via tactile cues, VCs, manual cues; +2 for part of session for focus on RUE and RLE and balance for ADL tasks in sitting EOB.   -Bedside sitting balance with supervision during visual and reaching activities for managing clothes. -stand pivot transfer to L +2 assist, focusing on wt shifting, L stance stability -SBT w/c> mat slightly downhill with min assist and significantly increased time; mat> w/c to R with mod assist -bil UE task in sitting on EOM folding towels, occasional cue for visual tracking -sit> stand at [facility] walker with max assist; pt  tolerated standing x 2 minutes with focus on R hip and knee extension    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, aphasia, ?apraxia, visual deficits - ?diplopia, decreased cognition Restrictions Weight Bearing Restrictions: No Other Position/Activity Restrictions: Per sister in room, pt had a bad right hip before this and was to have surgery, this is really painful to her now Pain: Pain Assessment Pain Assessment: No/denies pain Faces Pain Scale: Hurts a little bit Pain Type: Chronic pain Pain Location: Hip Pain Orientation: Right Pain Intervention(s): RN made aware; requested timed medication to address R hip pain.  Pt does not appear to be accurate in rating her R hip pain and how it limits activity in therapy. Other Treatments: Treatments Neuromuscular Facilitation: Right;Lower Extremity;Upper Extremity;Activity to increase timing and sequencing;Activity to increase motor control;Activity to increase lateral weight shifting;Activity to increase anterior-posterior weight shifting See FIM for current functional status  Therapy/Group: Individual Therapy  Colson Barco 12/01/2012, 3:58 PM

## 2012-12-02 ENCOUNTER — Encounter (HOSPITAL_COMMUNITY): Payer: PRIVATE HEALTH INSURANCE

## 2012-12-02 ENCOUNTER — Inpatient Hospital Stay (HOSPITAL_COMMUNITY): Payer: PRIVATE HEALTH INSURANCE | Admitting: Physical Therapy

## 2012-12-02 ENCOUNTER — Inpatient Hospital Stay (HOSPITAL_COMMUNITY): Payer: PRIVATE HEALTH INSURANCE | Admitting: Speech Pathology

## 2012-12-02 ENCOUNTER — Inpatient Hospital Stay (HOSPITAL_COMMUNITY): Payer: PRIVATE HEALTH INSURANCE | Admitting: Occupational Therapy

## 2012-12-02 ENCOUNTER — Inpatient Hospital Stay (HOSPITAL_COMMUNITY): Payer: PRIVATE HEALTH INSURANCE

## 2012-12-02 LAB — GLUCOSE, CAPILLARY
Glucose-Capillary: 188 mg/dL — ABNORMAL HIGH (ref 70–99)
Glucose-Capillary: 141 mg/dL — ABNORMAL HIGH (ref 70–99)
Glucose-Capillary: 152 mg/dL — ABNORMAL HIGH (ref 70–99)

## 2012-12-02 MED ORDER — DILTIAZEM HCL ER COATED BEADS 360 MG PO CP24
360.0000 mg | ORAL_CAPSULE | Freq: Every day | ORAL | Status: DC
  Administered 2012-12-02 – 2012-12-15 (×14): 360 mg via ORAL
  Filled 2012-12-02 (×16): qty 1

## 2012-12-02 NOTE — Progress Notes (Signed)
Physical Therapy Session Note  Patient Details  Name: [name] MRN: [id number] Date of Birth: 21-Aug-1945  Today's Date: 12/02/2012 Time: 1120-1205 Time Calculation (min): 45 min  Short Term Goals: Week 2:  PT Short Term Goal 1 (Week 2): Pt will move supine> sit with supervision. PT Short Term Goal 2 (Week 2): Pt will transfer bed>< w/c with mod assist. PT Short Term Goal 3 (Week 2): Pt will propel w/c x 50' with min assist. PT Short Term Goal 4 (Week 2): Pt will perform gait x 10' with max assist.  Skilled Therapeutic Interventions/Progress Updates:  Treatment focused on neuromuscular re-education via VCs, tactile cues for: -visual scanning of environment for safety and visual orientation -pelvic dissociation for transfer preparation in sitting -isolated RLE hip flexion, knee extension and ankle DF in sitting, > alternating with LLE -sit> stand at parallel bars with max assist; standing x 4 minutes while visually attending to a magazine on a table in front of pt    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, aphasia, ?apraxia, visual deficits - ?diplopia, decreased cognition Restrictions Weight Bearing Restrictions: No Other Position/Activity Restrictions: Per sister in room, pt had a bad right hip before this and was to have surgery, this is really painful to her now   Pain: Pain Assessment Pain Assessment: Faces Faces Pain Scale: Hurts a little bit Pain Type: Chronic pain Pain Location: Hip Pain Orientation: Right Pain Descriptors: Aching Pain Onset: With Activity Pain Intervention(s): Medication (See eMAR) PAINAD (Pain Assessment in Advanced Dementia) Locomotion : Wheelchair Mobility Distance: 50    Other Treatments: Treatments Neuromuscular Facilitation: Right;Lower Extremity;Upper Extremity;Activity to increase timing and sequencing;Activity to increase motor  control;Activity to increase lateral weight shifting;Activity to increase anterior-posterior weight shifting  See FIM for current functional status  Therapy/Group: Individual Therapy  [name] 12/02/2012, 12:35 PM

## 2012-12-02 NOTE — Progress Notes (Signed)
Speech Language Pathology Daily Session Note  Patient Details  Name: [name] MRN: [id number] Date of Birth: February 27, 1946  Today's Date: 12/02/2012 Time: 1035-1100 Time Calculation (min): 25 min  Short Term Goals: Week 2: SLP Short Term Goal 1 (Week 2): Patient will return use of call bell to request help with min assist verbal and visual cues SLP Short Term Goal 2 (Week 2): Patient will label common objects with mod assist semantic cues SLP Short Term Goal 3 (Week 2): Patient will follow 1-step commands with supervision assist verbal cues SLP Short Term Goal 4 (Week 2): Patient will increase awarenss of perseverative errors with mod assist verbal and visual cues  Skilled Therapeutic Interventions: Skilled treatment session focused on addressing language goals.  SLP facilitated task by providing 1 step verbal directions with 1/6 accuracy; patient verbalized frustration with performance and stated that yesterday it was easier.  Patient required repeat/re -wording and demonstrative cues for 100% accuracy.  Patient named basic ADL items with 4/6 accuracy with semantic cues and 6/6 when phonemic cues were added to the cuing hierarchy.  Overall, spontaneous speech continues to be fluent with accurate general statements and semantic par aphasias with max assist clinician verbal and non-verbal cues needed to increase awareness.     FIM:  Comprehension Comprehension Mode: Auditory Comprehension: 3-Understands basic 50 - 74% of the time/requires cueing 25 - 50%  of the time Expression Expression Mode: Verbal Expression: 2-Expresses basic 25 - 49% of the time/requires cueing 50 - 75% of the time. Uses single words/gestures. Social Interaction Social Interaction: 4-Interacts appropriately 75 - 89% of the time - Needs redirection for appropriate language or to initiate interaction. Problem Solving Problem Solving Mode: Not assessed Problem Solving: 3-Solves basic 50 - 74% of the time/requires  cueing 25 - 49% of the time Memory Memory: 3-Recognizes or recalls 50 - 74% of the time/requires cueing 25 - 49% of the time FIM - Eating Eating Activity: 5: Supervision/cues  Pain Pain Assessment Pain Assessment: No/denies pain  Therapy/Group: Individual Therapy  Charlane Ferretti., CCC-SLP [phone]  Lorice Lafave 12/02/2012, 5:13 PM

## 2012-12-02 NOTE — Patient Care Conference (Signed)
Inpatient RehabilitationTeam Conference and Plan of Care Update Date: 12/01/2012   Time: 10:50 AM    Patient Name: Damira Kem      Medical Record Number: [id number]  Date of Birth: January 31, 1946 Sex: Female         Room/Bed: 4009/4009-01 Payor Info: Payor: [employer]vertising [employer] MEDICARE  Plan: [employer]  Product Type: *No Product type*     Admitting Diagnosis: LT CVA  Admit Date/Time:  11/24/2012  4:46 PM Admission Comments: No comment available   Primary Diagnosis:  CVA (cerebral infarction) Principal Problem: CVA (cerebral infarction)  Patient Active Problem List   Diagnosis Date Noted  . CVA (cerebral infarction) 11/25/2012  . Hypertension, accelerated 11/18/2012  . New Onset Atrial fibrillation with RVR 11/18/2012  . CKD (chronic kidney disease) stage 3, GFR 30-59 ml/min 11/18/2012  . Diabetes mellitus, controlled 11/18/2012  . Headache 11/18/2012  . Acute cerebral infarction/Large acute left PCA infarct & Small acute right PCA cortical infarct involving the occipital 11/18/2012    Expected Discharge Date: Expected Discharge Date: 12/17/12  Team Members Present: Physician leading conference: Dr. Claudette Laws Social Worker Present: Amada Jupiter, LCSW Nurse Present: Rosalio Macadamia, RN PT Present: Wanda Plump, PT Clarisse Gouge Ripa, PT) OT Present: Leonette Monarch, OT [facility], OT) SLP Present: Fae Pippin, SLP Other (Discipline and Name): Tora Duck, PPS     Current Status/Progress Goal Weekly Team Focus  Medical   aphasic, R field cut  Improve communication which should help with continence  Speech to ccordinate with NSG to help with toileting program   Bowel/Bladder   Pt has incontinent episodes of bowel and baldder  Pt will be continent of bowel and baldder  Timed toileting   Swallow/Nutrition/ Hydration   regular and thin          ADL's   LB B/Dsg max-total (currently at bed level), Mod A slide board transfer however requires +2 for safety.    SUPERVISION: Self  feeding, Grooming & UB dressing, MIN A: Toilet Transfer, MOD A: Bath, LB Dressing and Toileting  Activity tolerance, pain management, forced use of RUE, visual attention to right, sit><stand and standing tolerance/balance, drop arm commode transfers   Mobility   +2 assist tranfers; total assist w/c mobility; +2 assist sit> stand and stand x 3 min   min assist basic transfers, mod assist car, min assist w/c x 50', mod assist gait x 25' controlled env, 15' home env  neuro-re-ed, visual/perceptual awareness, functional mobility, activity tolerance   Communication   mod-max assist   supervision-min assist   increase self-monitoring and correcting   Safety/Cognition/ Behavioral Observations  mod assist   supervision-min assist   increase self-monitoring and correcting   Pain   Pt c/o right hip pain  Pt's pain will be controlled with Ultram  Continue to monitor   Skin   Skin is intact  Skin will remain intact during admission         *See Care Plan and progress notes for long and short-term goals.  Barriers to Discharge: Caregiver will be needed    Possible Resolutions to Barriers:  Identify family members that can assist vs SNF    Discharge Planning/Teaching Needs:  Home with sisters to arrange 24/7 care -       Team Discussion:  Some improvement in hemiparesis.  Hip pain seems to be biggest limiting factor as pt very protective with every movement.  Recommend change to scheduled pain dosing.  Internally distracted but making slow gains.  Does better  with functional tasks.  Anticipate min - mod w/c level at d/c.  Concern as to whether family can really provide this level of care - SW to follow up.  Revisions to Treatment Plan:  Change in pain med administration to "stay ahead' of hip pain   Continued Need for Acute Rehabilitation Level of Care: The patient requires daily medical management by a physician with specialized training in physical medicine and rehabilitation for the following  conditions: Daily direction of a multidisciplinary physical rehabilitation program to ensure safe treatment while eliciting the highest outcome that is of practical value to the patient.: Yes Daily medical management of patient stability for increased activity during participation in an intensive rehabilitation regime.: Yes Daily analysis of laboratory values and/or radiology reports with any subsequent need for medication adjustment of medical intervention for : Neurological problems  Oniyah Rohe 12/02/2012, 4:23 PM

## 2012-12-02 NOTE — Progress Notes (Signed)
Speech Language Pathology Weekly Progress Note  Patient Details  Name: [name] MRN: [id number] Date of Birth: August 01, 1946  Today's Date: 12/02/2012  Short Term Goals: Week 1: SLP Short Term Goal 1 (Week 1): Patient will return use of call bell to request help with mod assist verbal and visual cues SLP Short Term Goal 1 - Progress (Week 1): Met SLP Short Term Goal 2 (Week 1): Patient will label common objects with mod assist semantic and phonemic cues SLP Short Term Goal 2 - Progress (Week 1): Met SLP Short Term Goal 3 (Week 1): Patient will follow 1-step commands with mod assist visual cues SLP Short Term Goal 3 - Progress (Week 1): Met SLP Short Term Goal 4 (Week 1): Patient will increase awarenss of perseverative errors with max assist verbal and visual cues. SLP Short Term Goal 4 - Progress (Week 1): Met Week 2: SLP Short Term Goal 1 (Week 2): Patient will return use of call bell to request help with min assist verbal and visual cues SLP Short Term Goal 2 (Week 2): Patient will label common objects with mod assist semantic cues SLP Short Term Goal 3 (Week 2): Patient will follow 1-step commands with supervision assist verbal cues SLP Short Term Goal 4 (Week 2): Patient will increase awarenss of perseverative errors with mod assist verbal and visual cues  Weekly Progress Updates: Patient met 4 out of 4 short term goals this reporting period due to gains in ability to follow 1 step directions, name basic objects with semantic and phonemic cues, awareness of verbal perseverative errors and problem solving with use of call bell.  Patient continues to required skilled SLP services to address these ongoing deficits with less clinician cuing which will, maximize functional independence and reduce burden of care prior to discharge with discharge plan TBD.   SLP Intensity: Minumum of 1-2 x/day, 30 to 90 minutes SLP Frequency: 5 out of 7 days SLP Duration/Estimated Length of Stay: 12/17/12 SLP  Treatment/Interventions: Cognitive remediation/compensation;Cueing hierarchy;Environmental controls;Functional tasks;Internal/external aids;Patient/family education;Speech/Language facilitation;Therapeutic Activities  Charlane Ferretti., CCC-SLP [phone]  Mariachristina Holle 12/02/2012, 8:49 AM

## 2012-12-02 NOTE — Progress Notes (Signed)
Physical Therapy Session Note  Patient Details  Name: [name] MRN: [id number] Date of Birth: [birth year]  Today's Date: 12/02/2012 Time: 7829-5621 Time Calculation (min): 54 min  Short Term Goals: Week 2:  PT Short Term Goal 1 (Week 2): Pt will move supine> sit with supervision. PT Short Term Goal 2 (Week 2): Pt will transfer bed>< w/c with mod assist. PT Short Term Goal 3 (Week 2): Pt will propel w/c x 50' with min assist. PT Short Term Goal 4 (Week 2): Pt will perform gait x 10' with max assist.  Skilled Therapeutic Interventions/Progress Updates:    Pt needed to use the bathroom before going to the gym for therapy.  Second person called in to assist with transfer to [facility].  Stand pivot total assist +2 pt 70% towards her left side.  Verbal cues for leg sequencing and hand placement.  Stand pivot from [facility] to WC total assist +2 pt 70% with RW this time (better stepping with UE support).  WC mobility 150' with max assist to steer and get power to propel forward.  Max verbal cues to keep trying to use her right arm.  Standing in standing frame working on endurance and getting her right leg especially in a position to support her body (she kept it behind her).  Stood x 4 in standing frame from 3-4 mins at a time until fatigue.    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, aphasia, apraxia, visual deficits:diplopia, probable right field cut, decreased cognition Restrictions Weight Bearing Restrictions: No Other Position/Activity Restrictions: Per sister, pt had a bad right hip before this and was to have surgery, this is really painful to her now   Vital Signs: Therapy Vitals Temp: 97.4 F (36.3 C) Temp src: Oral Pulse Rate: 60 Resp: 18 BP: 138/86 mmHg Patient Position, if appropriate: Sitting Pain: Pain Assessment Pain Assessment: Faces Pain Score:   2 Faces Pain Scale: Hurts a little  bit Pain Type: Chronic pain Pain Location: Hip Pain Orientation: Right Pain Descriptors: Aching Pain Onset: With Activity   Locomotion : Wheelchair Mobility Distance: 150     Other Treatments: Treatments Neuromuscular Facilitation: Right;Lower Extremity;Upper Extremity;Activity to increase timing and sequencing;Activity to increase motor control;Activity to increase lateral weight shifting;Activity to increase anterior-posterior weight shifting  See FIM for current functional status  Therapy/Group: Individual Therapy  [name], PT, DPT [phone]   12/02/2012, 4:23 PM

## 2012-12-02 NOTE — Progress Notes (Signed)
Patient ID: [name], female   DOB: Jul 04, 1946, 67 y.o.   MRN: [id number] Subjective/Complaints: 67 y.o. right-handed female with history of hypertension, diabetes mellitus with peripheral neuropathy. Admitted 11/18/2012 with headache and elevated blood pressure 172/127. EKG showed atrial fibrillation with RVR. 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 Echocardiogram with ejection fraction of 65% and no wall motion abnormalities. Carotid Dopplers with no ICA stenosis. Patient did not receive TPA. Started on Cardizem infusion for atrial fibrillation and transition to by mouth. 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 Neurology services consulted maintained on aspirin therapy as well as Xarelto.  Oriented to [facility], aphasic, cannot identify reason for hospitalization , points to R side  Review of Systems  Unable to perform ROS: medical condition    Objective: Vital Signs: Blood pressure 160/90, pulse 78, temperature 97.4 F (36.3 C), temperature source Oral, resp. rate 16, height 5\' 3"  (1.6 m), weight 108.5 kg (239 lb 3.2 oz), SpO2 100.00%. No results found. Results for orders placed during the hospital encounter of 11/24/12 (from the past 72 hour(s))  GLUCOSE, CAPILLARY     Status: Abnormal   Collection Time    11/29/12  7:10 AM      Result Value Range   Glucose-Capillary 132 (*) 70 - 99 mg/dL   Comment 1 Notify RN    GLUCOSE, CAPILLARY     Status: Abnormal   Collection Time    11/29/12 11:30 AM      Result Value Range   Glucose-Capillary 225 (*) 70 - 99 mg/dL   Comment 1 Notify RN    GLUCOSE, CAPILLARY     Status: Abnormal   Collection Time    11/29/12  4:59 PM      Result Value Range   Glucose-Capillary 122 (*) 70 - 99 mg/dL  GLUCOSE, CAPILLARY     Status: Abnormal   Collection Time    11/29/12  8:58 PM      Result Value Range   Glucose-Capillary 226 (*) 70 - 99 mg/dL  GLUCOSE,  CAPILLARY     Status: Abnormal   Collection Time    11/30/12  7:14 AM      Result Value Range   Glucose-Capillary 159 (*) 70 - 99 mg/dL   Comment 1 Notify RN    BASIC METABOLIC PANEL     Status: Abnormal   Collection Time    11/30/12  9:37 AM      Result Value Range   Sodium 139  135 - 145 mEq/L   Potassium 4.3  3.5 - 5.1 mEq/L   Chloride 103  96 - 112 mEq/L   CO2 27  19 - 32 mEq/L   Glucose, Bld 211 (*) 70 - 99 mg/dL   BUN 36 (*) 6 - 23 mg/dL   Creatinine, Ser 4.09 (*) 0.50 - 1.10 mg/dL   Calcium 9.5  8.4 - 81.1 mg/dL   GFR calc non Af Amer 31 (*) >90 mL/min   GFR calc Af Amer 36 (*) >90 mL/min   Comment:            The eGFR has been calculated     using the CKD EPI equation.     This calculation has not been     validated in all clinical     situations.     eGFR's persistently     <90 mL/min signify     possible Chronic  Kidney Disease.  GLUCOSE, CAPILLARY     Status: Abnormal   Collection Time    11/30/12 12:02 PM      Result Value Range   Glucose-Capillary 159 (*) 70 - 99 mg/dL   Comment 1 Notify RN    GLUCOSE, CAPILLARY     Status: Abnormal   Collection Time    11/30/12  5:08 PM      Result Value Range   Glucose-Capillary 189 (*) 70 - 99 mg/dL  GLUCOSE, CAPILLARY     Status: Abnormal   Collection Time    11/30/12  8:39 PM      Result Value Range   Glucose-Capillary 176 (*) 70 - 99 mg/dL  GLUCOSE, CAPILLARY     Status: Abnormal   Collection Time    12/01/12  7:18 AM      Result Value Range   Glucose-Capillary 162 (*) 70 - 99 mg/dL   Comment 1 Notify RN    TSH     Status: None   Collection Time    12/01/12  7:25 AM      Result Value Range   TSH 1.649  0.350 - 4.500 uIU/mL  GLUCOSE, CAPILLARY     Status: Abnormal   Collection Time    12/01/12 11:25 AM      Result Value Range   Glucose-Capillary 148 (*) 70 - 99 mg/dL  GLUCOSE, CAPILLARY     Status: Abnormal   Collection Time    12/01/12  9:15 PM      Result Value Range   Glucose-Capillary 154 (*) 70 -  99 mg/dL      Vitals reviewed.  Gen: NAD Eyes:  Pupils reactive to light  Neck: Neck supple. No thyromegaly present.  Cardiovascular:  Irreg Irreg, no Murmur Pulmonary/Chest: Effort normal and breath sounds normal.  Abdominal: Bowel sounds are normal. She exhibits no distension.  Neurological: She is alert.  Patient is appropriate to basic conversation during exam. She followed simple commands. She was able to provide her name and age. Noted left gaze preference is persistent. Is able to use left UE however for functional activities Alert.Oriented to self only,Receptive language deficits noted  Dense right homonymous hemianopsia with poor compensation  Motor strength 4+/5 in the right deltoid, biceps, triceps, grip,3-/5 hip flexor knee extensor ankle dorsiflexor plantar flexor  4/5 on the left side. Needs cues to engage the right side.  Sensory absent LT RUE and RLE, intact on left Mood/Affect:  Appropriate  Assessment/Plan: 1. Functional deficits secondary to Left PCA embolic infarct which require 3+ hours per day of interdisciplinary therapy in a comprehensive inpatient rehab setting. Physiatrist is providing close team supervision and 24 hour management of active medical problems listed below. Physiatrist and rehab team continue to assess barriers to discharge/monitor patient progress toward functional and medical goals. FIM: FIM - Bathing Bathing Steps Patient Completed: Chest;Abdomen;Right Arm;Left Arm Bathing: 2: Max-Patient completes 3-4 15f 10 parts or 25-49% (HOB up and supine and roll)  FIM - Upper Body Dressing/Undressing Upper body dressing/undressing steps patient completed: Thread/unthread right bra strap;Thread/unthread left bra strap;Thread/unthread right sleeve of pullover shirt/dresss;Thread/unthread left sleeve of pullover shirt/dress;Put head through opening of pull over shirt/dress Upper body dressing/undressing: 4: Min-Patient completed 75 plus % of tasks FIM -  Lower Body Dressing/Undressing Lower body dressing/undressing steps patient completed: Thread/unthread left pants leg;Don/Doff left sock Lower body dressing/undressing: 1: Total-Patient completed less than 25% of tasks (long sitting and supine with rolling)  FIM - Banker Devices: Sliding board Bed/Chair Transfer: 3: Bed > Chair or W/C: Mod A (lift or lower assist);3: Chair or W/C > Bed: Mod A (lift or lower assist)  FIM - Locomotion: Wheelchair Distance: 10 feet Locomotion: Wheelchair: 1: Total Assistance/staff pushes wheelchair (Pt<25%) FIM - Locomotion: Ambulation Locomotion: Ambulation Assistive Devices: Designer, industrial/product Ambulation/Gait Assistance: 1: +2 Total assist Locomotion: Ambulation: 0: Activity did not occur  Comprehension Comprehension Mode: Auditory Comprehension: 3-Understands basic 50 - 74% of the time/requires cueing 25 - 50%  of the time  Expression Expression Mode: Verbal Expression: 3-Expresses basic 50 - 74% of the time/requires cueing 25 - 50% of the time. Needs to repeat parts of sentences.  Social Interaction Social Interaction: 4-Interacts appropriately 75 - 89% of the time - Needs redirection for appropriate language or to initiate interaction.  Problem Solving Problem Solving Mode: Not assessed Problem Solving: 2-Solves basic 25 - 49% of the time - needs direction more than half the time to initiate, plan or complete simple activities  Memory Memory: 2-Recognizes or recalls 25 - 49% of the time/requires cueing 51 - 75% of the time  Medical Problem List and Plan:  1. Embolic large left PCA infarct  2. DVT Prophylaxis/Anticoagulation: Xarelto.  3. Neuropsych: This patient Is not capable of making decisions on his/her own behalf.  4. New onset atrial fibrillation. Continue Cardizem and Toprol as advised.may need to adjust dose  5. Diabetes mellitus with peripheral neuropathy. Hemoglobin A1c 6.1. SSI.  gluophage on hold for elevated Creat. Recheck BMET Fair control at present. 6. Hypertension. Patient on Lotrel 10-20 daily prior to admission as well as hydrochlorothiazide 25 mg daily.   - pt is on toprol and cardizem for rate control with BP borderline  -increased cardizem to 300mg -BP and HR improving but not in range,will increase to 360mg    LOS (Days) 8 A FACE TO FACE EVALUATION WAS PERFORMED  KIRSTEINS,ANDREW E 12/02/2012, 6:47 AM

## 2012-12-02 NOTE — Progress Notes (Signed)
Occupational Therapy Session Note  Patient Details  Name: [name] MRN: [id number] Date of Birth: 1946/05/06  Today's Date: 12/02/2012 Time: 0900-1000 Time Calculation (min): 60 min  Short Term Goals: Week 1:  OT Short Term Goal 1 (Week 1): Bath:  Mod A with LB bath OT Short Term Goal 2 (Week 1): UB Dressing:  Mod Assist OT Short Term Goal 3 (Week 1): LB Dressing:  Max Assist to include stand OT Short Term Goal 4 (Week 1): RUE:  Use RUE at least 50% of the time during BADL tasks with mod cues OT Short Term Goal 5 (Week 1): Vision:  Continue to evaluate patient's vision and begin to address PRN  Skilled Therapeutic Interventions/Progress Updates:  Self care retraining to include bath, dress and scoot transfer to w/c with +2. Focused session on forced use of RUE, visual scanning and attention to right visual field, verbal and tactile cues to reduce perseveration episodes during BADL, functional mobility with patient able to move with less assistance when given time.  Called patient's sister Claris Che to inform her that patient needs some more clean clothes.  Patient continues to be limited with her mobility by right hip pain and bilateral knee pain.  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, aphasia, apraxia, visual deficits:diplopia, probable right field cut, decreased cognition Restrictions Weight Bearing Restrictions: No Other Position/Activity Restrictions: Per sister, pt had a bad right hip before this and was to have surgery, this is really painful to her now Pain: Right hip and bilateral knees, not rated, RN notified and medication provided ADL: See FIM for current functional status  Therapy/Group: Individual Therapy  Ilias Stcharles 12/02/2012, 1:26 PM

## 2012-12-03 ENCOUNTER — Inpatient Hospital Stay (HOSPITAL_COMMUNITY): Payer: PRIVATE HEALTH INSURANCE

## 2012-12-03 ENCOUNTER — Inpatient Hospital Stay (HOSPITAL_COMMUNITY): Payer: PRIVATE HEALTH INSURANCE | Admitting: Speech Pathology

## 2012-12-03 ENCOUNTER — Inpatient Hospital Stay (HOSPITAL_COMMUNITY): Payer: PRIVATE HEALTH INSURANCE | Admitting: Occupational Therapy

## 2012-12-03 LAB — GLUCOSE, CAPILLARY
Glucose-Capillary: 144 mg/dL — ABNORMAL HIGH (ref 70–99)
Glucose-Capillary: 124 mg/dL — ABNORMAL HIGH (ref 70–99)

## 2012-12-03 NOTE — Progress Notes (Signed)
Physical Therapy Session Note  Patient Details  Name: [name] MRN: [id number] Date of Birth: 1945-12-02  Today's Date: 12/03/2012 Time: 1115-1200 and 8:30 - 9:00 Time Calculation (min): 45 min and 30 minutes  Short Term Goals: Week 1:  PT Short Term Goal 1 (Week 1): Pt will move supine > sit with min assist. PT Short Term Goal 1 - Progress (Week 1): Met PT Short Term Goal 2 (Week 1): Pt will transfer bed>< w/c with max assist. PT Short Term Goal 2 - Progress (Week 1): Met PT Short Term Goal 3 (Week 1): Pt will stand x 1 minute with assistance. PT Short Term Goal 3 - Progress (Week 1): Met PT Short Term Goal 4 (Week 1): Pt will demonstrate route finding with mod cues when up in w/c. PT Short Term Goal 4 - Progress (Week 1): Not met Week 2:  PT Short Term Goal 1 (Week 2): Pt will move supine> sit with supervision. PT Short Term Goal 2 (Week 2): Pt will transfer bed>< w/c with mod assist. PT Short Term Goal 3 (Week 2): Pt will propel w/c x 50' with min assist. PT Short Term Goal 4 (Week 2): Pt will perform gait x 10' with max assist.  Skilled Therapeutic Interventions/Progress Updates:  Session 1: Patient sitting up in bed upon entering room. Patient performed active left LE and active assistive right LE exercises x 10 reps each of ankle dorsiflexion, hip abduction, and heel slides. Patient supine to sit with HOB raised and bed rails with min assist for right LE due to pain. Patient total assist to don ted hose and shoes. Patient sit to stand with max assist (assist to lift and lower) and performed stand pivot transfer to wheelchair with +1 assist. Patient required assist to maneuver walker and for balance/weight shift and patient was able to turn LE's independently. Patient left in wheelchair with quick release belt on and items in reach.  Session 2: patient sitting in wheelchair upon entering room. Patient pushed by therapist to gym. Patient sit to stand with max assist (lifting and  lowering) however patient doing 50% of the work. Patient ambulated with rolling walker 6 feet and 4 feet with seated rest in between. Patient with very small steps bilaterally and tends to slide feet forward as opposed to picking them up. Patient maintained static standing 2.5 minutes with UE support on rolling walker and min assist for balance. Patient exercised using kinetron - sitting in wheelchair facing foot pedals x 20 reps each LE. Patient propelled wheelchair with bilateral UE's 25 feet with max assist/cueing. Patient left in wheelchair with quick release belt in place and all items in reach.  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, aphasia, apraxia, visual deficits:diplopia, probable right field cut, decreased cognition Restrictions Weight Bearing Restrictions: No Other Position/Activity Restrictions: Per sister, pt had a bad right hip before this and was to have surgery, this is really painful to her now Pain: Pain Assessment Pain Assessment: 0-10 Pain Score:   5 Pain Location: Hip Pain Orientation: Right Locomotion : Ambulation Ambulation/Gait Assistance: 3: Mod assist Wheelchair Mobility Distance: 25 feet   See FIM for current functional status  Therapy/Group: Individual Therapy  Arelia Longest M 12/03/2012, 12:15 PM

## 2012-12-03 NOTE — Progress Notes (Signed)
Occupational Therapy Weekly Progress Note  Patient Details  Name: [name] MRN: [id number] Date of Birth: [birth year]  Today's Date: 12/03/2012 Time: 1000-1100 Time Calculation (min): 60 min  Patient has met 5 of 5 short term goals. She has been progressing well with her initiation of tasks, attention to tasks, attentiveness to right UE and use of her RUE, RUE strength, sit to stand with BUE, and activity tolerance.  Patient continues to demonstrate the following deficits: right hemiparesis, sever RLE orthopedic pain, visual deficits of blurry vision and impaired visual fields and therefore will continue to benefit from skilled OT intervention to enhance overall performance with BADL and Reduce care partner burden.  Patient progressing toward long term goals..  Continue plan of care.  OT Short Term Goals Week 1:  OT Short Term Goal 1 (Week 1): Bath:  Mod A with LB bath OT Short Term Goal 1 - Progress (Week 1): Met OT Short Term Goal 2 (Week 1): UB Dressing:  Mod Assist OT Short Term Goal 2 - Progress (Week 1): Met OT Short Term Goal 3 (Week 1): LB Dressing:  Max Assist to include stand OT Short Term Goal 3 - Progress (Week 1): Met OT Short Term Goal 4 (Week 1): RUE:  Use RUE at least 50% of the time during BADL tasks with mod cues OT Short Term Goal 4 - Progress (Week 1): Met OT Short Term Goal 5 (Week 1): Vision:  Continue to evaluate patient's vision and begin to address PRN OT Short Term Goal 5 - Progress (Week 1): Met Week 2:  OT Short Term Goal 1 (Week 2): Pt will sit to stand to RW with mod assist x1 to be able to pull pants over hips. OT Short Term Goal 2 (Week 2): Pt will don pants with mod assist. OT Short Term Goal 3 (Week 2): Pt will be able to transfer to a shower chair with a stand pivot using grab bars with mod assist. OT Short Term Goal 4 (Week 2): Pt will be able to sit on shower seat and bathe with mod assist.  Skilled Therapeutic Interventions/Progress Updates: Pt  seen for BADL retraining of toileting, bathing, and dressing with a focus on sit to stand and standing tolerance along with use of RUE.  Pt was in w/c at start of session and stated that she needed to toilet. Pt's w/c placed adjacent to her bed with bed rail raised. Pt pulled herself into standing with minimally assist. One helper steadied patient in standing, while other helped moved w/c and placed BSC underneath patient.  She sat down slowly but would not release a hand to reach back. Second helper left the room and pt completed the rest of the session with assist only from the therapist.  From [facility], pt washed UB using RUE as a dominant assist 70-80-% of the time. She also washed front perineal area and tops of thighs from sitting.  Mod assist to don front closure bra, one verbal cue and set up with pullover shirt. After toileting, she stood up to bed and held onto rail to wash perineal area and pull pants up 70% of the way.  BSC moved out of the way and w/c placed behind patient.  She then completed her oral care and grooming at the sink after set up. She attempted to brush teeth with right hand, but it was too difficult for her at this time.  At end of session, quick release belt applied and call light within  reach.  Continue OT services 1-2x a day for 5-7 days a week with Balance/vestibular training;Cognitive remediation/compensation;Community reintegration;DME/adaptive equipment instruction;Discharge planning;Functional mobility training;Neuromuscular re-education;Pain management;Patient/family education;Psychosocial support;Self Care/advanced ADL retraining;Therapeutic Activities;Therapeutic Exercise;UE/LE Strength taining/ROM;UE/LE Coordination activities;Visual/perceptual remediation/compensation;Wheelchair propulsion/positioning to maximize her independence with ADLS.  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,  aphasia, apraxia, visual deficits:diplopia, probable right field cut, decreased cognition Restrictions Weight Bearing Restrictions: No Other Position/Activity Restrictions: Per sister, pt had a bad right hip before this and was to have surgery, this is really painful to her now  Pain: Pt c/o of intermittent RLE pain with position changes. Pain resolved quickly with change in position.   ADL:  See FIM for current functional status  Therapy/Group: Individual Therapy  SAGUIER,JULIA 12/03/2012, 12:00 PM

## 2012-12-03 NOTE — Progress Notes (Signed)
Occupational Therapy Session Note  Patient Details  Name: [name] MRN: [id number] Date of Birth: 09-27-1945  Today's Date: 12/03/2012 Time: 4098-1191 Time Calculation (min): 30 min  OT Short Term Goals Week 2:  OT Short Term Goal 1 (Week 2): Pt will sit to stand to RW with mod assist x1 to be able to pull pants over hips. OT Short Term Goal 2 (Week 2): Pt will don pants with mod assist. OT Short Term Goal 3 (Week 2): Pt will be able to transfer to a shower chair with a stand pivot using grab bars with mod assist. OT Short Term Goal 4 (Week 2): Pt will be able to sit on shower seat and bathe with mod assist. OT Short Term Goal 5 (Week 2): Patient will feed self with min assist   Skilled Therapeutic Interventions/Progress Updates:  Patient in w/c upon arrival and required encouragement to participate.  Reviewed the therapy schedule and patient siad well no one ever shows me the schedule.  Placed patient's schedule and her glasses on table in front of her.  She had difficulty locating glasses located to the right of middle.  She does not use her eyes to find them; she uses her hands.  Patient attempted to read schedule and was unable to read any portion of it.  This clinician wrote her last 2 sessions on the schedule in large font and patient able to identify most of the numbers on the left yet often did not use the correct words ("10:00, 10:30" instead of 1:00, 1:30).  For words located to right of midline "I cannot see anything" despite attempts to use adaptive techniques to help her locate them. Patient demonstrates verbal perseveration and continued to use the word "glasses" during a different conversation.  RUE forced used with reaching for cup, hold cup under running water, carefully poor into sink, then bring to mouth to take a sip.  Patient very awkward with these simple tasks and occasionally required [facility] for accuracy and quality of movement.  Therapy Documentation Precautions:   Precautions Precautions: Fall Precaution Comments: pain with PROM bil knees and R hip due to OA, aphasia, apraxia, visual deficits:diplopia, probable right field cut, decreased cognition Restrictions Weight Bearing Restrictions: No Other Position/Activity Restrictions: Per sister, pt had a bad right hip before this and was to have surgery, this is really painful to her now Pain:  Denies  Therapy/Group: Individual Therapy  Merve Hotard 12/03/2012, 3:03 PM

## 2012-12-03 NOTE — Progress Notes (Signed)
Speech Language Pathology Daily Session Note  Patient Details  Name: [name] MRN: [id number] Date of Birth: 1946-05-19  Today's Date: 12/03/2012 Time: 1350-1430 Time Calculation (min): 40 min  Short Term Goals: Week 2: SLP Short Term Goal 1 (Week 2): Patient will return use of call bell to request help with min assist verbal and visual cues SLP Short Term Goal 2 (Week 2): Patient will label common objects with mod assist semantic cues SLP Short Term Goal 3 (Week 2): Patient will follow 1-step commands with supervision assist verbal cues SLP Short Term Goal 4 (Week 2): Patient will increase awarenss of perseverative errors with mod assist verbal and visual cues  Skilled Therapeutic Interventions: Skilled treatment session focused on addressing cognitive-linguistic goals.  SLP facilitated task by providing 1 step verbal directions with 5/6 accuracy; 2 step directions with 0/6 accuracy and hand over hand assist to complete.  Patient named basic ADL items with 4/6 accuracy with semantic cues and 6/6 when phonemic cues were added to the cuing hierarchy.  Patient with lunch present; SLP attempted a more functional naming task by discussing what she was eating for lunch.  Patient required max assist verbal and semantic cues to name items.  Overall, spontaneous speech continues to be fluent with responses such as "I need 50 million dollars and a hot boyfriend" with laughter in response to therapist asking if she needed anything.     FIM:  Comprehension Comprehension Mode: Auditory Comprehension: 3-Understands basic 50 - 74% of the time/requires cueing 25 - 50%  of the time Expression Expression Mode: Verbal Expression: 2-Expresses basic 25 - 49% of the time/requires cueing 50 - 75% of the time. Uses single words/gestures. Social Interaction Social Interaction: 4-Interacts appropriately 75 - 89% of the time - Needs redirection for appropriate language or to initiate interaction. Problem  Solving Problem Solving: 3-Solves basic 50 - 74% of the time/requires cueing 25 - 49% of the time Memory Memory: 3-Recognizes or recalls 50 - 74% of the time/requires cueing 25 - 49% of the time  Pain Pain Assessment Pain Assessment: No/denies pain  Therapy/Group: Individual Therapy  Charlane Ferretti., CCC-SLP [phone]  Donaldo Teegarden 12/03/2012, 4:29 PM

## 2012-12-03 NOTE — Progress Notes (Signed)
Patient ID: [name], female   DOB: 11-07-1945, 67 y.o.   MRN: [id number] Subjective/Complaints: 67 y.o. right-handed female with history of hypertension, diabetes mellitus with peripheral neuropathy. Admitted 11/18/2012 with headache and elevated blood pressure 172/127. EKG showed atrial fibrillation with RVR. 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 Echocardiogram with ejection fraction of 65% and no wall motion abnormalities. Carotid Dopplers with no ICA stenosis. Patient did not receive TPA. Started on Cardizem infusion for atrial fibrillation and transition to by mouth. 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 Neurology services consulted maintained on aspirin therapy as well as Xarelto.  Oriented to [facility], aphasic, cannot identify reason for hospitalization , points to R side  Review of Systems  Unable to perform ROS: medical condition    Objective: Vital Signs: Blood pressure 117/76, pulse 78, temperature 97.2 F (36.2 C), temperature source Oral, resp. rate 18, height 5\' 3"  (1.6 m), weight 108.5 kg (239 lb 3.2 oz), SpO2 98.00%. No results found. Results for orders placed during the hospital encounter of 11/24/12 (from the past 72 hour(s))  GLUCOSE, CAPILLARY     Status: Abnormal   Collection Time    11/30/12  7:14 AM      Result Value Range   Glucose-Capillary 159 (*) 70 - 99 mg/dL   Comment 1 Notify RN    BASIC METABOLIC PANEL     Status: Abnormal   Collection Time    11/30/12  9:37 AM      Result Value Range   Sodium 139  135 - 145 mEq/L   Potassium 4.3  3.5 - 5.1 mEq/L   Chloride 103  96 - 112 mEq/L   CO2 27  19 - 32 mEq/L   Glucose, Bld 211 (*) 70 - 99 mg/dL   BUN 36 (*) 6 - 23 mg/dL   Creatinine, Ser 4.09 (*) 0.50 - 1.10 mg/dL   Calcium 9.5  8.4 - 81.1 mg/dL   GFR calc non Af Amer 31 (*) >90 mL/min   GFR calc Af Amer 36 (*) >90 mL/min   Comment:            The eGFR has  been calculated     using the CKD EPI equation.     This calculation has not been     validated in all clinical     situations.     eGFR's persistently     <90 mL/min signify     possible Chronic Kidney Disease.  GLUCOSE, CAPILLARY     Status: Abnormal   Collection Time    11/30/12 12:02 PM      Result Value Range   Glucose-Capillary 159 (*) 70 - 99 mg/dL   Comment 1 Notify RN    GLUCOSE, CAPILLARY     Status: Abnormal   Collection Time    11/30/12  5:08 PM      Result Value Range   Glucose-Capillary 189 (*) 70 - 99 mg/dL  GLUCOSE, CAPILLARY     Status: Abnormal   Collection Time    11/30/12  8:39 PM      Result Value Range   Glucose-Capillary 176 (*) 70 - 99 mg/dL  GLUCOSE, CAPILLARY     Status: Abnormal   Collection Time    12/01/12  7:18 AM      Result Value Range   Glucose-Capillary 162 (*) 70 - 99 mg/dL   Comment 1 Notify RN  TSH     Status: None   Collection Time    12/01/12  7:25 AM      Result Value Range   TSH 1.649  0.350 - 4.500 uIU/mL  GLUCOSE, CAPILLARY     Status: Abnormal   Collection Time    12/01/12 11:25 AM      Result Value Range   Glucose-Capillary 148 (*) 70 - 99 mg/dL  GLUCOSE, CAPILLARY     Status: Abnormal   Collection Time    12/01/12  4:53 PM      Result Value Range   Glucose-Capillary 188 (*) 70 - 99 mg/dL  GLUCOSE, CAPILLARY     Status: Abnormal   Collection Time    12/01/12  9:15 PM      Result Value Range   Glucose-Capillary 154 (*) 70 - 99 mg/dL  GLUCOSE, CAPILLARY     Status: Abnormal   Collection Time    12/02/12  7:07 AM      Result Value Range   Glucose-Capillary 141 (*) 70 - 99 mg/dL   Comment 1 Notify RN    GLUCOSE, CAPILLARY     Status: Abnormal   Collection Time    12/02/12 11:00 AM      Result Value Range   Glucose-Capillary 222 (*) 70 - 99 mg/dL   Comment 1 Notify RN    GLUCOSE, CAPILLARY     Status: Abnormal   Collection Time    12/02/12  4:34 PM      Result Value Range   Glucose-Capillary 152 (*) 70 - 99  mg/dL  GLUCOSE, CAPILLARY     Status: Abnormal   Collection Time    12/02/12  9:18 PM      Result Value Range   Glucose-Capillary 153 (*) 70 - 99 mg/dL      Vitals reviewed.  Gen: NAD Eyes:  Pupils reactive to light  Neck: Neck supple. No thyromegaly present.  Cardiovascular:  Irreg Irreg, no Murmur Pulmonary/Chest: Effort normal and breath sounds normal.  Abdominal: Bowel sounds are normal. She exhibits no distension.  Neurological: She is alert.  Patient is appropriate to basic conversation during exam. She followed simple commands. She was able to provide her name and age. Noted left gaze preference is persistent. Is able to use left UE however for functional activities Alert.Oriented to self only,Receptive language deficits noted  Dense right homonymous hemianopsia with poor compensation  Motor strength 4+/5 in the right deltoid, biceps, triceps, grip,3-/5 hip flexor knee extensor ankle dorsiflexor plantar flexor  4/5 on the left side. Needs cues to engage the right side.  Sensory absent LT RUE and RLE, intact on left Mood/Affect:  Appropriate  Assessment/Plan: 1. Functional deficits secondary to Left PCA embolic infarct which require 3+ hours per day of interdisciplinary therapy in a comprehensive inpatient rehab setting. Physiatrist is providing close team supervision and 24 hour management of active medical problems listed below. Physiatrist and rehab team continue to assess barriers to discharge/monitor patient progress toward functional and medical goals. FIM: FIM - Bathing Bathing Steps Patient Completed: Chest;Abdomen;Right Arm;Left Arm;Left upper leg Bathing: 3: Mod-Patient completes 5-7 41f 10 parts or 50-74% (HOB up and supine and roll)  FIM - Upper Body Dressing/Undressing Upper body dressing/undressing steps patient completed: Thread/unthread right sleeve of pullover shirt/dresss;Thread/unthread left sleeve of pullover shirt/dress;Put head through opening of pull  over shirt/dress Upper body dressing/undressing: 4: Min-Patient completed 75 plus % of tasks FIM - Lower Body Dressing/Undressing Lower body dressing/undressing steps patient completed:  Don/Doff left sock Lower body dressing/undressing: 2: Max-Patient completed 25-49% of tasks     FIM - Diplomatic Services operational officer Devices: Building control surveyor Transfers: 1-Two helpers  FIM - Banker Devices: Arm rests Bed/Chair Transfer: 3: Bed > Chair or W/C: Mod A (lift or lower assist);1: Two helpers (scoot transfer)  FIM - Locomotion: Wheelchair Distance: 150 Locomotion: Wheelchair: 2: Travels 150 ft or more: maneuvers on rugs and over door sills with maximal assistance (Pt: 25 - 49%) FIM - Locomotion: Ambulation Locomotion: Ambulation Assistive Devices: Designer, industrial/product Ambulation/Gait Assistance: 1: +2 Total assist Locomotion: Ambulation: 0: Activity did not occur  Comprehension Comprehension Mode: Auditory Comprehension: 3-Understands basic 50 - 74% of the time/requires cueing 25 - 50%  of the time  Expression Expression Mode: Verbal Expression: 2-Expresses basic 25 - 49% of the time/requires cueing 50 - 75% of the time. Uses single words/gestures.  Social Interaction Social Interaction: 4-Interacts appropriately 75 - 89% of the time - Needs redirection for appropriate language or to initiate interaction.  Problem Solving Problem Solving Mode: Not assessed Problem Solving: 3-Solves basic 50 - 74% of the time/requires cueing 25 - 49% of the time  Memory Memory: 3-Recognizes or recalls 50 - 74% of the time/requires cueing 25 - 49% of the time  Medical Problem List and Plan:  1. Embolic large left PCA infarct  2. DVT Prophylaxis/Anticoagulation: Xarelto.  3. Neuropsych: This patient Is not capable of making decisions on his/her own behalf.  4. New onset atrial fibrillation. Continue Cardizem and Toprol as advised.may  need to adjust dose  5. Diabetes mellitus with peripheral neuropathy. Hemoglobin A1c 6.1. SSI. gluophage on hold for elevated Creat. Recheck BMET Fair control at present. 6. Hypertension. Patient on Lotrel 10-20 daily prior to admission as well as hydrochlorothiazide 25 mg daily.   - pt is on toprol and cardizem for rate control with BP borderline  -increased cardizem to 300mg -BP and HR improving but not in range,will increase to 360mg    LOS (Days) 9 A FACE TO FACE EVALUATION WAS PERFORMED  Aveon Colquhoun E 12/03/2012, 6:52 AM

## 2012-12-04 ENCOUNTER — Inpatient Hospital Stay (HOSPITAL_COMMUNITY): Payer: PRIVATE HEALTH INSURANCE | Admitting: *Deleted

## 2012-12-04 LAB — GLUCOSE, CAPILLARY
Glucose-Capillary: 169 mg/dL — ABNORMAL HIGH (ref 70–99)
Glucose-Capillary: 117 mg/dL — ABNORMAL HIGH (ref 70–99)

## 2012-12-04 NOTE — Progress Notes (Signed)
Patient ID: [name], female   DOB: 1946/05/20, 67 y.o.   MRN: [id number]   Patient ID: [name], female   DOB: 06-Sep-1945, 67 y.o.   MRN: [id number] Subjective/Complaints:   4/26.  67 y.o. right-handed female with history of hypertension, diabetes mellitus with peripheral neuropathy. Admitted 11/18/2012 with headache and elevated blood pressure 172/127. EKG showed atrial fibrillation with RVR. 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 Echocardiogram with ejection fraction of 65% and no wall motion abnormalities. Carotid Dopplers with no ICA stenosis. Patient did not receive TPA. Started on Cardizem infusion for atrial fibrillation and transition to by mouth. 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 Neurology services consulted maintained on aspirin therapy as well as Xarelto.  BP Readings from Last 3 Encounters:  12/04/12 138/80  11/24/12 120/67  05/18/12 147/84    CBG (last 3)   Recent Labs  12/03/12 1653 12/03/12 2131 12/04/12 0732  GLUCAP 169* 124* 117*      Review of Systems  Unable to perform ROS: medical condition   General alert HEENT negative chest clear Cardiovascular-  controlled ventricular response Abdomen benign  Impression- status post right PCA embolic stroke Atrial fibrillation- continue Xarelto anticoagulation; and diltiazem/metoprolol for rate control Hypertension-stable   Objective: Vital Signs: Blood pressure 138/80, pulse 69, temperature 98.2 F (36.8 C), temperature source Oral, resp. rate 18, height 5\' 3"  (1.6 m), weight 239 lb 3.2 oz (108.5 kg), SpO2 100.00%. No results found. Results for orders placed during the hospital encounter of 11/24/12 (from the past 72 hour(s))  GLUCOSE, CAPILLARY     Status: Abnormal   Collection Time    12/01/12 11:25 AM      Result Value Range   Glucose-Capillary 148 (*) 70 - 99 mg/dL  GLUCOSE, CAPILLARY      Status: Abnormal   Collection Time    12/01/12  4:53 PM      Result Value Range   Glucose-Capillary 188 (*) 70 - 99 mg/dL  GLUCOSE, CAPILLARY     Status: Abnormal   Collection Time    12/01/12  9:15 PM      Result Value Range   Glucose-Capillary 154 (*) 70 - 99 mg/dL  GLUCOSE, CAPILLARY     Status: Abnormal   Collection Time    12/02/12  7:07 AM      Result Value Range   Glucose-Capillary 141 (*) 70 - 99 mg/dL   Comment 1 Notify RN    GLUCOSE, CAPILLARY     Status: Abnormal   Collection Time    12/02/12 11:00 AM      Result Value Range   Glucose-Capillary 222 (*) 70 - 99 mg/dL   Comment 1 Notify RN    GLUCOSE, CAPILLARY     Status: Abnormal   Collection Time    12/02/12  4:34 PM      Result Value Range   Glucose-Capillary 152 (*) 70 - 99 mg/dL  GLUCOSE, CAPILLARY     Status: Abnormal   Collection Time    12/02/12  9:18 PM      Result Value Range   Glucose-Capillary 153 (*) 70 - 99 mg/dL  GLUCOSE, CAPILLARY     Status: Abnormal   Collection Time    12/03/12  7:24 AM      Result Value Range   Glucose-Capillary 144 (*) 70 - 99 mg/dL   Comment 1 Notify RN    GLUCOSE,  CAPILLARY     Status: Abnormal   Collection Time    12/03/12 12:04 PM      Result Value Range   Glucose-Capillary 158 (*) 70 - 99 mg/dL   Comment 1 Notify RN    GLUCOSE, CAPILLARY     Status: Abnormal   Collection Time    12/03/12  4:53 PM      Result Value Range   Glucose-Capillary 169 (*) 70 - 99 mg/dL   Comment 1 Notify RN    GLUCOSE, CAPILLARY     Status: Abnormal   Collection Time    12/03/12  9:31 PM      Result Value Range   Glucose-Capillary 124 (*) 70 - 99 mg/dL  GLUCOSE, CAPILLARY     Status: Abnormal   Collection Time    12/04/12  7:32 AM      Result Value Range   Glucose-Capillary 117 (*) 70 - 99 mg/dL   Comment 1 Notify RN        Vitals reviewed.  Gen: NAD Eyes:  Pupils reactive to light  Neck: Neck supple. No thyromegaly present.  Cardiovascular:  Irreg Irreg, no  Murmur Pulmonary/Chest: Effort normal and breath sounds normal.  Abdominal: Bowel sounds are normal. She exhibits no distension.  Neurological: She is alert.  Patient is appropriate to basic conversation during exam. She followed simple commands. She was able to provide her name and age. Noted left gaze preference is persistent. Is able to use left UE however for functional activities Alert.Oriented to self only,Receptive language deficits noted  Dense right homonymous hemianopsia with poor compensation  Motor strength 4+/5 in the right deltoid, biceps, triceps, grip,3-/5 hip flexor knee extensor ankle dorsiflexor plantar flexor  4/5 on the left side. Needs cues to engage the right side.  Sensory absent LT RUE and RLE, intact on left Mood/Affect:  Appropriate  Assessment/Plan: 1. Functional deficits secondary to Left PCA embolic infarct which require 3+ hours per day of interdisciplinary therapy in a comprehensive inpatient rehab setting. Physiatrist is providing close team supervision and 24 hour management of active medical problems listed below. Physiatrist and rehab team continue to assess barriers to discharge/monitor patient progress toward functional and medical goals. FIM: FIM - Bathing Bathing Steps Patient Completed: Chest;Right Arm;Left Arm;Abdomen;Front perineal area;Right upper leg;Left upper leg Bathing: 3: Mod-Patient completes 5-7 7f 10 parts or 50-74%  FIM - Upper Body Dressing/Undressing Upper body dressing/undressing steps patient completed: Thread/unthread right bra strap;Thread/unthread left bra strap;Thread/unthread left sleeve of pullover shirt/dress;Put head through opening of pull over shirt/dress;Pull shirt over trunk Upper body dressing/undressing: 4: Min-Patient completed 75 plus % of tasks FIM - Lower Body Dressing/Undressing Lower body dressing/undressing steps patient completed: Thread/unthread left pants leg Lower body dressing/undressing: 2: Max-Patient  completed 25-49% of tasks  FIM - Toileting Toileting: 1: Total-Patient completed zero steps, helper did all 3  FIM - Diplomatic Services operational officer Devices: Bedside commode;Grab bars Toilet Transfers: 2-To toilet/BSC: Max A (lift and lower assist);2-From toilet/BSC: Max A (lift and lower assist)  FIM - Press photographer Assistive Devices: Bed rails;HOB elevated Bed/Chair Transfer: 4: Supine > Sit: Min A (steadying Pt. > 75%/lift 1 leg);2: Bed > Chair or W/C: Max A (lift and lower assist)  FIM - Locomotion: Wheelchair Distance: 25 feet Locomotion: Wheelchair: 1: Travels less than 50 ft with maximal assistance (Pt: 25 - 49%) FIM - Locomotion: Ambulation Locomotion: Ambulation Assistive Devices: Designer, industrial/product Ambulation/Gait Assistance: 3: Mod assist Locomotion: Ambulation: 1: Travels less  than 50 ft with moderate assistance (Pt: 50 - 74%)  Comprehension Comprehension Mode: Auditory Comprehension: 6-Follows complex conversation/direction: With extra time/assistive device  Expression Expression Mode: Verbal Expression: 7-Expresses complex ideas: With no assist  Social Interaction Social Interaction: 7-Interacts appropriately with others - No medications needed.  Problem Solving Problem Solving Mode: Not assessed Problem Solving: 3-Solves basic 50 - 74% of the time/requires cueing 25 - 49% of the time  Memory Memory: 4-Recognizes or recalls 75 - 89% of the time/requires cueing 10 - 24% of the time  Medical Problem List and Plan:  1. Embolic large left PCA infarct  2. DVT Prophylaxis/Anticoagulation: Xarelto.  3. Neuropsych: This patient Is not capable of making decisions on his/her own behalf.  4. New onset atrial fibrillation. Continue Cardizem and Toprol as advised.may need to adjust dose  5. Diabetes mellitus with peripheral neuropathy. Hemoglobin A1c 6.1. SSI. gluophage on hold for elevated Creat. Recheck BMET Fair control at present. 6.  Hypertension. Patient on Lotrel 10-20 daily prior to admission as well as hydrochlorothiazide 25 mg daily.   - pt is on toprol and cardizem for rate control with BP borderline  -increased cardizem to 300mg -BP and HR improving but not in range,will increase to 360mg    LOS (Days) 10 A FACE TO FACE EVALUATION WAS PERFORMED  Rogelia Boga 12/04/2012, 9:34 AM

## 2012-12-04 NOTE — Progress Notes (Signed)
Occupational Therapy Note  Patient Details  Name: [name] MRN: [id number] Date of Birth: 1946-03-15 Today's Date: 12/04/2012  Time:  1430-1500 (30 min time) Pain:   Right arm  7/10 with movement; 0/10 with no movement Individual session     Addressed RUE NMRE with active movement in elbow, wrist,  Forearm, hand open, close, lateral grasp. Shoulder flexion and extension.  Provided simple verbal cues for pt to follow.  Pt. With receptive issues and difficulty with right/left discrimination. Went over exercises but needs addressing again     Humberto Seals 12/04/2012, 2:50 PM

## 2012-12-04 NOTE — Consult Note (Signed)
NEUROCOGNITIVE STATUS EXAMINATION - [facility]   [name] is a 67 year old, right-handed woman, who was seen for a neurocognitive status examination to assess her emotional state and mental status post-stroke.  According to her medical record, she was admitted on 11/18/2012 with headache and elevated blood pressure.  MRI demonstrated large acute left PCA infarct as well as small acute right PCA cortical infarct.  She did not receive TPA.    Emotional Functioning:  During the clinical interview, [name] was reluctant to discuss her mood and reactions to experiencing the stroke.  She ultimately acknowledged feeling sad and repeatedly stated that it was difficult to deal with stroke recovery, as she has never faced anything similar.  She mentioned that at times, she "doesn't want to bother at all," but clarified that this was not an expression of passive suicidal ideation; rather she is having trouble seeing her progress and so does not understand the point of participating in various therapies.  She commented that it would be "nice" if her therapists would not force her to do therapy, though she ultimately agreed that the therapies are important.  She stated that she feels she has adequate social support, but it is hard for her to be unable to do things for herself.  When asked what motivates her, she replied that "not much" could motivate her in her current state of mind.    [name] difficulties with spoken language comprehension (see below) and inability to see in order to read did not allow for administration of self-report mood indices.    Mental Status:  [name] has multiple vision issues that significantly impeded her ability to complete certain subtests.  As such, several subtests were omitted.  However, her ability to complete other tasks that did not require use of vision was also significantly impaired; at a level of dementia.  In fact, the  only activities that she was able to complete were repeating 3 digits backwards and correctly identifying the day of the week and location in which she was being tested.  Behaviorally, she was observed to have significant trouble comprehending instructions and prompts.  She often required repeated instructions and typically required clarification or step-by-step instructions in order to complete tasks.  Significant word-finding problems were observed as well.  Finally, she needed significant encouragement in order to participate fully in testing.    Impressions and Recommendations:  [name] overall cognitive functioning was severely impaired; at the level of dementia.  She demonstrated significant trouble comprehending and following task instructions.  She is also likely experiencing some depressed mood, though it seems to be an adjustment reaction to her current condition, rather than a serious underlying mood disorder.  Still, her mood and pessimistic attitude toward various therapies could certainly impede her ability to recover well.  [name] was reluctant to participate in my clinical interview.  However, when I acknowledged that she did not want to do it, but that she needed to, she begrudgingly answered my questions and participated in testing.  Her other therapists may find that it is helpful to remind her that she has said that she understands the importance of therapy as they are trying to motivate her to participate.  They should not expect her to participate optimistically, as her mood state is prohibiting such an attitude at this time.  However, she may participate reluctantly if they do not give her an option to choose not to.  [name] may not  respond well to enthusiastic encouragement, but may respond better to her therapists acknowledging that it is undesirable to have to do the things she is being asked to do, but she has to do them anyway.  When providing instructions, her treatment  team should be as concrete and concise as possible to improve her comprehension for the instructions.  They should also expect to repeat themselves frequently.  Although depressed mood may be present and in some ways adversely impacting her functioning, it does not seem to be at a level where medication intervention is needed.  However, should her mood appear to worsen, this could be re-assessed.    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  Clinical Neuropsychologist

## 2012-12-05 ENCOUNTER — Inpatient Hospital Stay (HOSPITAL_COMMUNITY): Payer: PRIVATE HEALTH INSURANCE | Admitting: *Deleted

## 2012-12-05 LAB — GLUCOSE, CAPILLARY
Glucose-Capillary: 139 mg/dL — ABNORMAL HIGH (ref 70–99)
Glucose-Capillary: 139 mg/dL — ABNORMAL HIGH (ref 70–99)
Glucose-Capillary: 138 mg/dL — ABNORMAL HIGH (ref 70–99)

## 2012-12-05 NOTE — Progress Notes (Signed)
Physical Therapy Session Note  Patient Details  Name: [name] MRN: [id number] Date of Birth: January 13, 1946  Today's Date: 12/05/2012 Time: 0145-0230 Time Calculation (min): 45 min   Skilled Therapeutic Interventions/Progress Updates:  Patient sitting in w/c upon arrival to the room , states she needs to use the bathroom before she can do any therapy. Patient education and training on maneuvering w/c to the bathroom, max A needed. Toilet transfer with max A patient pushes from arm rests and than needs tactile cues to find the grab bar (due to low vision).Transfer with max A. Patient needed max A to clean up after using the bathroom, stands holding a grab bar ,standing time about 1 min ,with knee wobble and poor postural control,not able to help with donning lower body dressing. Standing up in front of the sink to change brief, patient needs max A to stand, tries to lift her hands of the sink and lifts her R foot of the floor. W/C pushups 2 x 7, LAQ 2 x 10. Standing with RW 2 x 1.30 min  With minA for standing and max for sit to stand transfer.    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, aphasia, apraxia, visual deficits:diplopia, probable right field cut, decreased cognition Restrictions Weight Bearing Restrictions: No Other Position/Activity Restrictions: Per sister, pt had a bad right hip before this and was to have surgery, this is really painful to her now Vital Signs: Therapy Vitals Temp: 98.1 F (36.7 C) Temp src: Oral Pulse Rate: 50 Resp: 18 BP: 120/71 mmHg Patient Position, if appropriate: Sitting Oxygen Therapy SpO2: 97 % O2 Device: None (Room air)  See FIM for current functional status  Therapy/Group: Individual Therapy  Dorna Mai 12/05/2012, 3:48 PM

## 2012-12-05 NOTE — Progress Notes (Signed)
Patient ID: [name], female   DOB: 1946/04/28, 67 y.o.   MRN: [id number]  Patient ID: [name], female   DOB: 28-Dec-1945, 67 y.o.   MRN: [id number]   Patient ID: [name], female   DOB: 13-Dec-1945, 67 y.o.   MRN: [id number] Subjective/Complaints:   4/27.   67 y.o. right-handed female with history of hypertension, diabetes mellitus with peripheral neuropathy. Admitted 11/18/2012 with headache and elevated blood pressure 172/127. EKG showed atrial fibrillation with RVR. 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 Echocardiogram with ejection fraction of 65% and no wall motion abnormalities. Carotid Dopplers with no ICA stenosis. Patient did not receive TPA. Started on Cardizem infusion for atrial fibrillation and transition to by mouth. 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 Neurology services consulted maintained on aspirin therapy as well as Xarelto.  BP Readings from Last 3 Encounters:  12/05/12 149/83  11/24/12 120/67  05/18/12 147/84    CBG (last 3)   Recent Labs  12/04/12 1134 12/04/12 1646 12/04/12 2109  GLUCAP 194* 168* 135*      Review of Systems  Unable to perform ROS: medical condition   General alert obese HEENT negative; mild disconjugate gaze chest clear Cardiovascular-  controlled ventricular response Abdomen benign Extremities- no edema   Impression- status post right PCA embolic stroke Atrial fibrillation- continue Xarelto anticoagulation; and diltiazem/metoprolol for rate control Hypertension-stable Diabetes- continue Novolog   Objective: Vital Signs: Blood pressure 149/83, pulse 73, temperature 97.3 F (36.3 C), temperature source Oral, resp. rate 18, height 5\' 3"  (1.6 m), weight 239 lb 3.2 oz (108.5 kg), SpO2 100.00%. No results found. Results for orders placed during the hospital encounter of 11/24/12 (from the past 72 hour(s))  GLUCOSE,  CAPILLARY     Status: Abnormal   Collection Time    12/02/12 11:00 AM      Result Value Range   Glucose-Capillary 222 (*) 70 - 99 mg/dL   Comment 1 Notify RN    GLUCOSE, CAPILLARY     Status: Abnormal   Collection Time    12/02/12  4:34 PM      Result Value Range   Glucose-Capillary 152 (*) 70 - 99 mg/dL  GLUCOSE, CAPILLARY     Status: Abnormal   Collection Time    12/02/12  9:18 PM      Result Value Range   Glucose-Capillary 153 (*) 70 - 99 mg/dL  GLUCOSE, CAPILLARY     Status: Abnormal   Collection Time    12/03/12  7:24 AM      Result Value Range   Glucose-Capillary 144 (*) 70 - 99 mg/dL   Comment 1 Notify RN    GLUCOSE, CAPILLARY     Status: Abnormal   Collection Time    12/03/12 12:04 PM      Result Value Range   Glucose-Capillary 158 (*) 70 - 99 mg/dL   Comment 1 Notify RN    GLUCOSE, CAPILLARY     Status: Abnormal   Collection Time    12/03/12  4:53 PM      Result Value Range   Glucose-Capillary 169 (*) 70 - 99 mg/dL   Comment 1 Notify RN    GLUCOSE, CAPILLARY     Status: Abnormal   Collection Time    12/03/12  9:31 PM      Result Value Range   Glucose-Capillary 124 (*) 70 - 99 mg/dL  GLUCOSE, CAPILLARY  Status: Abnormal   Collection Time    12/04/12  7:32 AM      Result Value Range   Glucose-Capillary 117 (*) 70 - 99 mg/dL   Comment 1 Notify RN    GLUCOSE, CAPILLARY     Status: Abnormal   Collection Time    12/04/12 11:34 AM      Result Value Range   Glucose-Capillary 194 (*) 70 - 99 mg/dL   Comment 1 Notify RN    GLUCOSE, CAPILLARY     Status: Abnormal   Collection Time    12/04/12  4:46 PM      Result Value Range   Glucose-Capillary 168 (*) 70 - 99 mg/dL   Comment 1 Notify RN    GLUCOSE, CAPILLARY     Status: Abnormal   Collection Time    12/04/12  9:09 PM      Result Value Range   Glucose-Capillary 135 (*) 70 - 99 mg/dL      Vitals reviewed.  Gen: NAD Eyes:  Pupils reactive to light  Neck: Neck supple. No thyromegaly present.   Cardiovascular:  Irreg Irreg, no Murmur Pulmonary/Chest: Effort normal and breath sounds normal.  Abdominal: Bowel sounds are normal. She exhibits no distension.  Neurological: She is alert.  Patient is appropriate to basic conversation during exam. She followed simple commands. She was able to provide her name and age. Noted left gaze preference is persistent. Is able to use left UE however for functional activities Alert.Oriented to self only,Receptive language deficits noted  Dense right homonymous hemianopsia with poor compensation  Motor strength 4+/5 in the right deltoid, biceps, triceps, grip,3-/5 hip flexor knee extensor ankle dorsiflexor plantar flexor  4/5 on the left side. Needs cues to engage the right side.  Sensory absent LT RUE and RLE, intact on left Mood/Affect:  Appropriate  Assessment/Plan: 1. Functional deficits secondary to Left PCA embolic infarct which require 3+ hours per day of interdisciplinary therapy in a comprehensive inpatient rehab setting. Physiatrist is providing close team supervision and 24 hour management of active medical problems listed below. Physiatrist and rehab team continue to assess barriers to discharge/monitor patient progress toward functional and medical goals. FIM: FIM - Bathing Bathing Steps Patient Completed: Chest;Right Arm;Left Arm;Abdomen;Front perineal area;Right upper leg;Left upper leg Bathing: 3: Mod-Patient completes 5-7 72f 10 parts or 50-74%  FIM - Upper Body Dressing/Undressing Upper body dressing/undressing steps patient completed: Thread/unthread right bra strap;Thread/unthread left bra strap;Thread/unthread left sleeve of pullover shirt/dress;Put head through opening of pull over shirt/dress;Pull shirt over trunk Upper body dressing/undressing: 4: Min-Patient completed 75 plus % of tasks FIM - Lower Body Dressing/Undressing Lower body dressing/undressing steps patient completed: Thread/unthread left pants leg Lower body  dressing/undressing: 2: Max-Patient completed 25-49% of tasks  FIM - Toileting Toileting: 1: Two helpers  FIM - Diplomatic Services operational officer Devices: Bedside commode;Grab bars Toilet Transfers: 1-Two helpers  FIM - Banker Devices: Bed rails;HOB elevated Bed/Chair Transfer: 1: Two helpers  FIM - Locomotion: Wheelchair Distance: 25 feet Locomotion: Wheelchair: 1: Travels less than 50 ft with maximal assistance (Pt: 25 - 49%) FIM - Locomotion: Ambulation Locomotion: Ambulation Assistive Devices: Designer, industrial/product Ambulation/Gait Assistance: 3: Mod assist Locomotion: Ambulation: 1: Travels less than 50 ft with moderate assistance (Pt: 50 - 74%)  Comprehension Comprehension Mode: Auditory Comprehension: 6-Follows complex conversation/direction: With extra time/assistive device  Expression Expression Mode: Verbal Expression: 7-Expresses complex ideas: With no assist  Social Interaction Social Interaction: 7-Interacts appropriately with others -  No medications needed.  Problem Solving Problem Solving Mode: Not assessed Problem Solving: 3-Solves basic 50 - 74% of the time/requires cueing 25 - 49% of the time  Memory Memory: 4-Recognizes or recalls 75 - 89% of the time/requires cueing 10 - 24% of the time  Medical Problem List and Plan:  1. Embolic large left PCA infarct  2. DVT Prophylaxis/Anticoagulation: Xarelto.  3. Neuropsych: This patient Is not capable of making decisions on his/her own behalf.  4. New onset atrial fibrillation. Continue Cardizem and Toprol as advised.may need to adjust dose  5. Diabetes mellitus with peripheral neuropathy. Hemoglobin A1c 6.1. SSI. gluophage on hold for elevated Creat. Recheck BMET Fair control at present. 6. Hypertension. Patient on Lotrel 10-20 daily prior to admission as well as hydrochlorothiazide 25 mg daily.   - pt is on toprol and cardizem for rate control with BP  borderline  -increased cardizem to 300mg -BP and HR improving but not in range,will increase to 360mg    LOS (Days) 11 A FACE TO FACE EVALUATION WAS PERFORMED  Rogelia Boga 12/05/2012, 8:42 AM

## 2012-12-06 ENCOUNTER — Inpatient Hospital Stay (HOSPITAL_COMMUNITY): Payer: PRIVATE HEALTH INSURANCE | Admitting: Occupational Therapy

## 2012-12-06 ENCOUNTER — Inpatient Hospital Stay (HOSPITAL_COMMUNITY): Payer: PRIVATE HEALTH INSURANCE

## 2012-12-06 ENCOUNTER — Inpatient Hospital Stay (HOSPITAL_COMMUNITY): Payer: PRIVATE HEALTH INSURANCE | Admitting: Speech Pathology

## 2012-12-06 DIAGNOSIS — G8110 Spastic hemiplegia affecting unspecified side: Secondary | ICD-10-CM

## 2012-12-06 DIAGNOSIS — I6340 Cerebral infarction due to embolism of unspecified cerebral artery: Secondary | ICD-10-CM

## 2012-12-06 DIAGNOSIS — I69921 Dysphasia following unspecified cerebrovascular disease: Secondary | ICD-10-CM

## 2012-12-06 LAB — GLUCOSE, CAPILLARY: Glucose-Capillary: 140 mg/dL — ABNORMAL HIGH (ref 70–99)

## 2012-12-06 NOTE — Progress Notes (Signed)
Occupational Therapy Session Notes  Patient Details  Name: [name] MRN: [id number] Date of Birth: [birth year]  Today's Date: 12/06/2012 Time: 1100-1200 and 1200-1230 Time Calculation (min): 60 min and 30 min  Short Term Goals: Week 2:  OT Short Term Goal 1 (Week 2): Pt will sit to stand to RW with mod assist x1 to be able to pull pants over hips. OT Short Term Goal 2 (Week 2): Pt will don pants with mod assist. OT Short Term Goal 3 (Week 2): Pt will be able to transfer to a shower chair with a stand pivot using grab bars with mod assist. OT Short Term Goal 4 (Week 2): Pt will be able to sit on shower seat and bathe with mod assist. OT Short Term Goal 5 (Week 2): Patient will feed self with min assist  Skilled Therapeutic Interventions/Progress Updates:  1)  Patient up in w/c upon arrival.  Self care retraining to include sponge bath and dressing at sink.  Focused session on locating items with her eyes and attending to and scanning to her right side, forced use of RUE, scoot forward and back in w/c, sit><stand with min assist to pull up her pants while holding onto the sink, took her into the bathroom via w/c so she could see the shower setup with plans to shower this week.    2)  Assisted with self-feeding during patient's lunch.  Provided hand over hand for patient to locate all items on her tray, provided set up on tray to include patient assisting with hand over hand, placed fork in patient's left don-dominant hand to she could feed self with occasional guidance.  Patient able to put fork down then reach for tea glass and using BUEs to bring glass to mouth.  Patient requires occasional assistance to locate utensils throughout meal therefore intermittent supervision/assistance.  When patient has finger foods, she can more easily use her hands to eat instead of a utensil.  Assistance guidelines typed and posted above patient's bed for staff and caregivers.  Therapy  Documentation Precautions:  Precautions Precautions: Fall Precaution Comments: pain with PROM bil knees and R hip due to OA, aphasia, apraxia, visual deficits:diplopia, probable right field cut, decreased cognition Restrictions Weight Bearing Restrictions: No Other Position/Activity Restrictions: Per sister, pt had a bad right hip before this and was to have surgery, this is really painful to her now Pain: Denies pain ADL: See FIM for current functional status  Therapy/Group: Individual Therapy  [name] 12/06/2012, 4:23 PM

## 2012-12-06 NOTE — Progress Notes (Signed)
Speech Language Pathology Daily Session Note  Patient Details  Name: [name] MRN: [id number] Date of Birth: [birth year]  Today's Date: 12/06/2012 Time: 1445-1530 Time Calculation (min): 45 min  Short Term Goals: Week 2: SLP Short Term Goal 1 (Week 2): Patient will return use of call bell to request help with min assist verbal and visual cues SLP Short Term Goal 2 (Week 2): Patient will label common objects with mod assist semantic cues SLP Short Term Goal 3 (Week 2): Patient will follow 1-step commands with supervision assist verbal cues SLP Short Term Goal 4 (Week 2): Patient will increase awarenss of perseverative errors with mod assist verbal and visual cues  Skilled Therapeutic Interventions: Treatment focus on language goals. SLP facilitated session by providing Mod A question cues for verbal expression of current deficits and goals of current treatment sessions. Pt also required Mod question, visual and phonemic to describe pictures at the phrase level. Pt also required Mod verbal and tactile cues to attend to right field of environment to locate pictures.    FIM:  Comprehension Comprehension Mode: Auditory Comprehension: 4-Understands basic 75 - 89% of the time/requires cueing 10 - 24% of the time Expression Expression Mode: Verbal Expression: 3-Expresses basic 50 - 74% of the time/requires cueing 25 - 50% of the time. Needs to repeat parts of sentences. Social Interaction Social Interaction: 4-Interacts appropriately 75 - 89% of the time - Needs redirection for appropriate language or to initiate interaction. Problem Solving Problem Solving: 3-Solves basic 50 - 74% of the time/requires cueing 25 - 49% of the time Memory Memory: 3-Recognizes or recalls 50 - 74% of the time/requires cueing 25 - 49% of the time  Pain Pain Assessment Pain Assessment: No/denies pain Pain Score: Asleep  Therapy/Group: Individual Therapy  Bethanee Redondo 12/06/2012, 3:37 PM

## 2012-12-06 NOTE — Progress Notes (Signed)
Patient ID: [name], female   DOB: 12-19-1945, 67 y.o.   MRN: [id number] Subjective/Complaints: 67 y.o. right-handed female with history of hypertension, diabetes mellitus with peripheral neuropathy. Admitted 11/18/2012 with headache and elevated blood pressure 172/127. EKG showed atrial fibrillation with RVR. 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 Echocardiogram with ejection fraction of 65% and no wall motion abnormalities. Carotid Dopplers with no ICA stenosis. Patient did not receive TPA. Started on Cardizem infusion for atrial fibrillation and transition to by mouth. 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 Neurology services consulted maintained on aspirin therapy as well as Xarelto.  Apraxic with visuospatial problems , couldn't correctly bu ton eyeglasses  Review of Systems  Unable to perform ROS: medical condition    Objective: Vital Signs: Blood pressure 138/80, pulse 68, temperature 98.1 F (36.7 C), temperature source Oral, resp. rate 18, height 5\' 3"  (1.6 m), weight 108.5 kg (239 lb 3.2 oz), SpO2 98.00%. No results found. Results for orders placed during the hospital encounter of 11/24/12 (from the past 72 hour(s))  GLUCOSE, CAPILLARY     Status: Abnormal   Collection Time    12/03/12  7:24 AM      Result Value Range   Glucose-Capillary 144 (*) 70 - 99 mg/dL   Comment 1 Notify RN    GLUCOSE, CAPILLARY     Status: Abnormal   Collection Time    12/03/12 12:04 PM      Result Value Range   Glucose-Capillary 158 (*) 70 - 99 mg/dL   Comment 1 Notify RN    GLUCOSE, CAPILLARY     Status: Abnormal   Collection Time    12/03/12  4:53 PM      Result Value Range   Glucose-Capillary 169 (*) 70 - 99 mg/dL   Comment 1 Notify RN    GLUCOSE, CAPILLARY     Status: Abnormal   Collection Time    12/03/12  9:31 PM      Result Value Range   Glucose-Capillary 124 (*) 70 - 99 mg/dL   GLUCOSE, CAPILLARY     Status: Abnormal   Collection Time    12/04/12  7:32 AM      Result Value Range   Glucose-Capillary 117 (*) 70 - 99 mg/dL   Comment 1 Notify RN    GLUCOSE, CAPILLARY     Status: Abnormal   Collection Time    12/04/12 11:34 AM      Result Value Range   Glucose-Capillary 194 (*) 70 - 99 mg/dL   Comment 1 Notify RN    GLUCOSE, CAPILLARY     Status: Abnormal   Collection Time    12/04/12  4:46 PM      Result Value Range   Glucose-Capillary 168 (*) 70 - 99 mg/dL   Comment 1 Notify RN    GLUCOSE, CAPILLARY     Status: Abnormal   Collection Time    12/04/12  9:09 PM      Result Value Range   Glucose-Capillary 135 (*) 70 - 99 mg/dL  GLUCOSE, CAPILLARY     Status: Abnormal   Collection Time    12/05/12  7:36 AM      Result Value Range   Glucose-Capillary 139 (*) 70 - 99 mg/dL   Comment 1 Notify RN    GLUCOSE, CAPILLARY     Status: Abnormal   Collection Time    12/05/12 11:41  AM      Result Value Range   Glucose-Capillary 139 (*) 70 - 99 mg/dL   Comment 1 Notify RN    GLUCOSE, CAPILLARY     Status: Abnormal   Collection Time    12/05/12  5:02 PM      Result Value Range   Glucose-Capillary 138 (*) 70 - 99 mg/dL   Comment 1 Notify RN    GLUCOSE, CAPILLARY     Status: Abnormal   Collection Time    12/05/12  8:56 PM      Result Value Range   Glucose-Capillary 189 (*) 70 - 99 mg/dL      Vitals reviewed.  Gen: NAD Eyes:  Pupils reactive to light  Neck: Neck supple. No thyromegaly present.  Cardiovascular:  Irreg Irreg, no Murmur Pulmonary/Chest: Effort normal and breath sounds normal.  Abdominal: Bowel sounds are normal. She exhibits no distension.  Neurological: She is alert.  Patient is appropriate to basic conversation during exam. She followed simple commands. She was able to provide her name and age. Noted left gaze preference is persistent. Is able to use left UE however for functional activities Alert.Oriented to self only,Receptive language  deficits noted  Dense right homonymous hemianopsia with poor compensation  Motor strength 4+/5 in the right deltoid, biceps, triceps, grip,3-/5 hip flexor knee extensor ankle dorsiflexor plantar flexor  4/5 on the left side. Needs cues to engage the right side.  Sensory absent LT RUE and RLE, intact on left Mood/Affect:  Appropriate  Assessment/Plan: 1. Functional deficits secondary to Left PCA embolic infarct which require 3+ hours per day of interdisciplinary therapy in a comprehensive inpatient rehab setting. Physiatrist is providing close team supervision and 24 hour management of active medical problems listed below. Physiatrist and rehab team continue to assess barriers to discharge/monitor patient progress toward functional and medical goals. FIM: FIM - Bathing Bathing Steps Patient Completed: Chest;Right Arm;Left Arm;Abdomen;Front perineal area;Right upper leg;Left upper leg Bathing: 3: Mod-Patient completes 5-7 52f 10 parts or 50-74%  FIM - Upper Body Dressing/Undressing Upper body dressing/undressing steps patient completed: Thread/unthread right bra strap;Thread/unthread left bra strap;Thread/unthread left sleeve of pullover shirt/dress;Put head through opening of pull over shirt/dress;Pull shirt over trunk Upper body dressing/undressing: 4: Min-Patient completed 75 plus % of tasks FIM - Lower Body Dressing/Undressing Lower body dressing/undressing steps patient completed: Thread/unthread left pants leg Lower body dressing/undressing: 2: Max-Patient completed 25-49% of tasks  FIM - Toileting Toileting: 1: Two helpers  FIM - Diplomatic Services operational officer Devices: Elevated toilet seat;Grab bars Toilet Transfers: 1-Two helpers  FIM - Banker Devices: Arm rests Bed/Chair Transfer: 1: Two helpers  FIM - Locomotion: Wheelchair Distance: 25 feet Locomotion: Wheelchair: 0: Activity did not occur FIM - Locomotion:  Ambulation Locomotion: Ambulation Assistive Devices: Designer, industrial/product Ambulation/Gait Assistance: 3: Mod assist Locomotion: Ambulation: 0: Activity did not occur  Comprehension Comprehension Mode: Auditory Comprehension: 6-Follows complex conversation/direction: With extra time/assistive device  Expression Expression Mode: Verbal Expression: 7-Expresses complex ideas: With no assist  Social Interaction Social Interaction: 7-Interacts appropriately with others - No medications needed.  Problem Solving Problem Solving Mode: Not assessed Problem Solving: 3-Solves basic 50 - 74% of the time/requires cueing 25 - 49% of the time  Memory Memory: 4-Recognizes or recalls 75 - 89% of the time/requires cueing 10 - 24% of the time  Medical Problem List and Plan:  1. Embolic large left PCA infarct  2. DVT Prophylaxis/Anticoagulation: Xarelto.  3. Neuropsych: This patient Is  not capable of making decisions on his/her own behalf.  4. New onset atrial fibrillation. Continue Cardizem and Toprol as advised.may need to adjust dose  5. Diabetes mellitus with peripheral neuropathy. Hemoglobin A1c 6.1. SSI. gluophage on hold for elevated Creat. Recheck BMET Fair control at present. 6. Hypertension. Patient on Lotrel 10-20 daily prior to admission as well as hydrochlorothiazide 25 mg daily.   - pt is on toprol and cardizem for rate control with BP borderline  -increased cardizem to 360mg -BP and HR improved   LOS (Days) 12 A FACE TO FACE EVALUATION WAS PERFORMED  Jeanice Dempsey E 12/06/2012, 6:54 AM

## 2012-12-06 NOTE — Progress Notes (Signed)
Social Work Patient ID: [name], female   DOB: [birth year], 67 y.o.   MRN: [id number] Spoke with pt and daughter via telephone to discuss team conference goals and discharge plan.  Daughter reports she begins a new job on  Wed 4/30.  She will check with pt's sisters and see if they can assist pt, but unsure if they can provide the care pt will require at discharge. Pt wants to go home but feels her home is not accessible.  Will work on a safe discharge plan.

## 2012-12-06 NOTE — Progress Notes (Signed)
Physical Therapy Session Note  Patient Details  Name: [name] MRN: [id number] Date of Birth: February 22, 1946  Today's Date: 12/06/2012 Time: 0905-1005 Time Calculation (min): 60 min  Short Term Goals: Week 2:  PT Short Term Goal 1 (Week 2): Pt will move supine> sit with supervision. PT Short Term Goal 2 (Week 2): Pt will transfer bed>< w/c with mod assist. PT Short Term Goal 3 (Week 2): Pt will propel w/c x 50' with min assist. PT Short Term Goal 4 (Week 2): Pt will perform gait x 10' with max assist.  Skilled Therapeutic Interventions/Progress Updates:  tx focused on neuromuscular re-education via demo, tactile cues, VCs, forced use; bed mobility, transfers, sit>< stand and standing  Pt sitting up in bed finishing breaksfast. Bed mobility with min assist without rails, in flat bed.  Pt stated she needed to urinate.  Bed> w/c max assist stand pivot to L.  W/c<> bedside commode to L with max assist, using RW.    W/c mobility using bil UEs, cues for R hand sustained use, x 20 strokes, counting aloud with therapist, x 25' with min assist. Total assist for legrests; tactile cues for brakes.  Sit>< stand in hall using bil UEs on railing, focusing on forward wt shifting; max assist.  Wt shifting in standing with cues.  Pt stood x 2 x 1 minute; L and R wt shifting with tactile cues.  C/o R hip pain; pt had not received meds before PT.  Discussed with RN; she will request change in AM med time to 7AM.  Pt returned to room and all needs left within reach; safety belt applied in w/c.    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, aphasia, apraxia, visual deficits:diplopia, probable right field cut, decreased cognition Restrictions Weight Bearing Restrictions: No Other Position/Activity Restrictions: Per sister, pt had a bad right hip before this and was to have surgery, this is really painful to her now   Pain: Pain Assessment Faces Pain Scale: Hurts whole lot Pain Location: Hip Pain Orientation: Right Pain Onset: With Activity Pain Intervention(s): Medication (See eMAR);RN made aware   Locomotion : Wheelchair Mobility Distance: 25  Trunk/Postural Assessment :    Balance:   Exercises:   Other Treatments: Treatments Neuromuscular Facilitation: Right;Lower Extremity;Upper Extremity;Activity to increase timing and sequencing;Activity to increase motor control;Activity to increase lateral weight shifting;Activity to increase anterior-posterior weight shifting  See FIM for current functional status  Therapy/Group: Individual Therapy  Kellie Murrill 12/06/2012, 12:40 PM

## 2012-12-07 ENCOUNTER — Inpatient Hospital Stay (HOSPITAL_COMMUNITY): Payer: PRIVATE HEALTH INSURANCE | Admitting: Occupational Therapy

## 2012-12-07 ENCOUNTER — Inpatient Hospital Stay (HOSPITAL_COMMUNITY): Payer: PRIVATE HEALTH INSURANCE

## 2012-12-07 ENCOUNTER — Inpatient Hospital Stay (HOSPITAL_COMMUNITY): Payer: PRIVATE HEALTH INSURANCE | Admitting: Speech Pathology

## 2012-12-07 DIAGNOSIS — I6340 Cerebral infarction due to embolism of unspecified cerebral artery: Secondary | ICD-10-CM

## 2012-12-07 DIAGNOSIS — I69921 Dysphasia following unspecified cerebrovascular disease: Secondary | ICD-10-CM

## 2012-12-07 DIAGNOSIS — G8110 Spastic hemiplegia affecting unspecified side: Secondary | ICD-10-CM

## 2012-12-07 LAB — CBC
Platelets: 355 10*3/uL (ref 150–400)
RBC: 4.44 MIL/uL (ref 3.87–5.11)
RDW: 13.4 % (ref 11.5–15.5)
WBC: 4 10*3/uL (ref 4.0–10.5)

## 2012-12-07 LAB — BASIC METABOLIC PANEL
CO2: 25 mEq/L (ref 19–32)
Calcium: 9.2 mg/dL (ref 8.4–10.5)
Chloride: 101 mEq/L (ref 96–112)
GFR calc Af Amer: 38 mL/min — ABNORMAL LOW (ref >90)
Sodium: 135 mEq/L (ref 135–145)

## 2012-12-07 LAB — GLUCOSE, CAPILLARY
Glucose-Capillary: 186 mg/dL — ABNORMAL HIGH (ref 70–99)
Glucose-Capillary: 154 mg/dL — ABNORMAL HIGH (ref 70–99)
Glucose-Capillary: 145 mg/dL — ABNORMAL HIGH (ref 70–99)

## 2012-12-07 NOTE — Progress Notes (Signed)
Physical Therapy Weekly Progress Note  Patient Details  Name: [name] MRN: [id number] Date of Birth: 19-Sep-1945  Today's Date: 12/07/2012 Time: 1300-1400 and 11:32 - 12:00 Time Calculation (min): 60 min and 28 minutes  Patient has met 3 of 4 short term goals. Patient requires min assist with right LE getting it off mat due to pain. Patient in making gains in all areas. She continues to be limited by visual deficits and pain. Patient continues to demonstrate the following deficits: diminished visual fields, pain in right LE, right LE weakness, dependency in mobility, transfers and ambulation, and therefore will continue to benefit from skilled PT intervention to enhance overall performance with activity tolerance, balance, postural control and functional use of  right lower extremity.  Patient progressing toward long term goals..  Continue plan of care.  PT Short Term Goals Week 2:  PT Short Term Goal 1 (Week 2): Pt will move supine> sit with supervision. PT Short Term Goal 1 - Progress (Week 2): Progressing toward goal (Patient requires min assist with right LE due to pain) PT Short Term Goal 2 (Week 2): Pt will transfer bed>< w/c with mod assist. PT Short Term Goal 2 - Progress (Week 2): Met PT Short Term Goal 3 (Week 2): Pt will propel w/c x 50' with min assist. PT Short Term Goal 3 - Progress (Week 2): Met PT Short Term Goal 4 (Week 2): Pt will perform gait x 10' with max assist. PT Short Term Goal 4 - Progress (Week 2): Met Week 3:  PT Short Term Goal 1 (Week 3): patient will move supine to sit with supervision PT Short Term Goal 2 (Week 3): Patient will transfer bed to and from wheelchair with min assist PT Short Term Goal 3 (Week 3): Patient will move sit to stand with supervision from wheelchair PT Short Term Goal 4 (Week 3): Patient will ambulate 20 feet with rolling walker and mod assist  Skilled Therapeutic Interventions/Progress Updates:  Session 1: Patient sitting in  wheelchair upon entering room. Therapist pushed patient in wheelchair to gym. Patient performed sitting LE exercises including ankle dorsiflexion, and knee extension x 10 reps each. Patient sit to stand with min steady assist and ambulated 8 feet with rolling walker and mod assist. Patient requires facilitation for weight shifting to left to un-weight right in preparation for swing on right. Patient has decreased step length on right and ambulates with step to pattern. Patient ambulated 5 feet with RW on second attempt.  Session 2: Patient sitting in wheelchair. Patient propelled wheelchair 50 feet using bilateral UE with min assist to avoid objects due to poor vision. Patient performed wheelchair to mat stand pivot transfer with mod assist. Patient worked in sitting on finding objects visually in each quadrant. Vision is difficult to assess due to patients inability to describe what she is seeing. Patient sit to supine with mod assist (lifting both LE's). Patient supine to sit with min assist for right LE only. Patient sit to stand and ambulated 10 feet with rolling walker and mod assist. Patient performed LE exercise on kinetron - facing seat - for 3 sets of 10 reps each. Patient returned to room and left with quick release belt in place and all items in reach.    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, aphasia, apraxia, visual deficits:diplopia, probable right field cut, decreased cognition Restrictions Weight Bearing Restrictions: No Other Position/Activity Restrictions: Per  sister, pt had a bad right hip before this and was to have surgery, this is really painful to her now Pain: Pain Assessment Pain Assessment: No/denies pain Locomotion : Ambulation Ambulation/Gait Assistance: 3: Mod assist Wheelchair Mobility Distance: 50   See FIM for current functional status  Therapy/Group: Individual  Therapy  Arelia Longest M 12/07/2012, 4:16 PM

## 2012-12-07 NOTE — Progress Notes (Signed)
Speech Language Pathology Daily Session Note  Patient Details  Name: [name] MRN: [id number] Date of Birth: [birth year]  Today's Date: 12/07/2012 Time: 0905-0950 Time Calculation (min): 45 min  Short Term Goals: Week 2: SLP Short Term Goal 1 (Week 2): Patient will return use of call bell to request help with min assist verbal and visual cues SLP Short Term Goal 2 (Week 2): Patient will label common objects with mod assist semantic cues SLP Short Term Goal 3 (Week 2): Patient will follow 1-step commands with supervision assist verbal cues SLP Short Term Goal 4 (Week 2): Patient will increase awarenss of perseverative errors with mod assist verbal and visual cues  Skilled Therapeutic Interventions: Skilled treatment session focused on addressing cognitive-linguistic goals. SLP facilitated session by providing increased wait time and min assist verbal cues to identify objects from field of 3-4; mod assist semantic and phonemic cues to label familiar objects; max assist question cues to self-monitor verbal perseverative errors with limited awareness of difficulty.     FIM:  Comprehension Comprehension Mode: Auditory Comprehension: 4-Understands basic 75 - 89% of the time/requires cueing 10 - 24% of the time Expression Expression Mode: Verbal Expression: 3-Expresses basic 50 - 74% of the time/requires cueing 25 - 50% of the time. Needs to repeat parts of sentences. Social Interaction Social Interaction: 4-Interacts appropriately 75 - 89% of the time - Needs redirection for appropriate language or to initiate interaction. Problem Solving Problem Solving: 2-Solves basic 25 - 49% of the time - needs direction more than half the time to initiate, plan or complete simple activities Memory Memory: 3-Recognizes or recalls 50 - 74% of the time/requires cueing 25 - 49% of the time  Pain Pain Assessment Pain Assessment: No/denies pain Pain Score: 0-No pain  Therapy/Group: Individual  Therapy  Charlane Ferretti., CCC-SLP [phone]  [name] 12/07/2012, 12:49 PM

## 2012-12-07 NOTE — Progress Notes (Signed)
Occupational Therapy Session Note  Patient Details  Name: [name] MRN: [id number] Date of Birth: 1946/08/09  Today's Date: 12/07/2012 Time: 0800-0900 Time Calculation (min): 60 min  Short Term Goals: Week 2:  OT Short Term Goal 1 (Week 2): Pt will sit to stand to RW with mod assist x1 to be able to pull pants over hips. OT Short Term Goal 2 (Week 2): Pt will don pants with mod assist. OT Short Term Goal 3 (Week 2): Pt will be able to transfer to a shower chair with a stand pivot using grab bars with mod assist. OT Short Term Goal 4 (Week 2): Pt will be able to sit on shower seat and bathe with mod assist. OT Short Term Goal 5 (Week 2): Patient will feed self with min assist  Skilled Therapeutic Interventions/Progress Updates:  Self care retraining to include bath, dress, toilet transfer, and toileting. Upon arrival, patient in bed with HOB up and breakfast tray in front of her and stating, "I'm about halfway finished".  Patient's tray did not look like she had eaten much and the fork she had been using was located to her far right.  Patient agreed to put breakfast aside so she could toilet and perform bath and dress tasks.  Patient performed scoot/squat pivot transfer bed>drop arm HD commode with mod assist and a second person to stabilize the commode.  Plus 2 for toileting and switch commode for w/c while patient was standing.  Patient sit><stand 3 times with assist level varied from Min to Mod assist.  Patient demonstrating significant perseveration during bath which occasionally required tactile cues to stop and guidance to move on secondary to verbal cues not effective.  Focused on visual scanning to right, forced use of right hand, attention to right, standing balance and tolerance.   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, aphasia, apraxia, visual deficits:diplopia, probable right field cut,  decreased cognition Restrictions Weight Bearing Restrictions: No Other Position/Activity Restrictions: Per sister, pt had a bad right hip before this and was to have surgery, this is really painful to her now Pain: Occasionally reports right hip pain with functional mobility, change positions. ADL: See FIM for current functional status  Therapy/Group: Individual Therapy  Alyscia Carmon 12/07/2012, 8:27 AM

## 2012-12-07 NOTE — Progress Notes (Signed)
Patient ID: [name], female   DOB: 05-15-1946, 67 y.o.   MRN: [id number] Subjective/Complaints: 67 y.o. right-handed female with history of hypertension, diabetes mellitus with peripheral neuropathy. Admitted 11/18/2012 with headache and elevated blood pressure 172/127. EKG showed atrial fibrillation with RVR. 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 Echocardiogram with ejection fraction of 65% and no wall motion abnormalities. Carotid Dopplers with no ICA stenosis. Patient did not receive TPA. Started on Cardizem infusion for atrial fibrillation and transition to by mouth. 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 Neurology services consulted maintained on aspirin therapy as well as Xarelto.  No c/os Reports BM  Review of Systems  Unable to perform ROS: medical condition    Objective: Vital Signs: Blood pressure 156/94, pulse 70, temperature 97.7 F (36.5 C), temperature source Oral, resp. rate 18, height 5\' 3"  (1.6 m), weight 109.045 kg (240 lb 6.4 oz), SpO2 97.00%. No results found. Results for orders placed during the hospital encounter of 11/24/12 (from the past 72 hour(s))  GLUCOSE, CAPILLARY     Status: Abnormal   Collection Time    12/04/12  7:32 AM      Result Value Range   Glucose-Capillary 117 (*) 70 - 99 mg/dL   Comment 1 Notify RN    GLUCOSE, CAPILLARY     Status: Abnormal   Collection Time    12/04/12 11:34 AM      Result Value Range   Glucose-Capillary 194 (*) 70 - 99 mg/dL   Comment 1 Notify RN    GLUCOSE, CAPILLARY     Status: Abnormal   Collection Time    12/04/12  4:46 PM      Result Value Range   Glucose-Capillary 168 (*) 70 - 99 mg/dL   Comment 1 Notify RN    GLUCOSE, CAPILLARY     Status: Abnormal   Collection Time    12/04/12  9:09 PM      Result Value Range   Glucose-Capillary 135 (*) 70 - 99 mg/dL  GLUCOSE, CAPILLARY     Status: Abnormal   Collection  Time    12/05/12  7:36 AM      Result Value Range   Glucose-Capillary 139 (*) 70 - 99 mg/dL   Comment 1 Notify RN    GLUCOSE, CAPILLARY     Status: Abnormal   Collection Time    12/05/12 11:41 AM      Result Value Range   Glucose-Capillary 139 (*) 70 - 99 mg/dL   Comment 1 Notify RN    GLUCOSE, CAPILLARY     Status: Abnormal   Collection Time    12/05/12  5:02 PM      Result Value Range   Glucose-Capillary 138 (*) 70 - 99 mg/dL   Comment 1 Notify RN    GLUCOSE, CAPILLARY     Status: Abnormal   Collection Time    12/05/12  8:56 PM      Result Value Range   Glucose-Capillary 189 (*) 70 - 99 mg/dL  GLUCOSE, CAPILLARY     Status: Abnormal   Collection Time    12/06/12  7:18 AM      Result Value Range   Glucose-Capillary 147 (*) 70 - 99 mg/dL   Comment 1 Notify RN    GLUCOSE, CAPILLARY     Status: Abnormal   Collection Time    12/06/12 11:45 AM  Result Value Range   Glucose-Capillary 152 (*) 70 - 99 mg/dL   Comment 1 Notify RN    GLUCOSE, CAPILLARY     Status: Abnormal   Collection Time    12/06/12  5:23 PM      Result Value Range   Glucose-Capillary 140 (*) 70 - 99 mg/dL   Comment 1 Notify RN    GLUCOSE, CAPILLARY     Status: Abnormal   Collection Time    12/06/12  9:18 PM      Result Value Range   Glucose-Capillary 186 (*) 70 - 99 mg/dL      Vitals reviewed.  Gen: NAD Eyes:  Pupils reactive to light  Neck: Neck supple. No thyromegaly present.  Cardiovascular:  Irreg Irreg, no Murmur Pulmonary/Chest: Effort normal and breath sounds normal.  Abdominal: Bowel sounds are normal. She exhibits no distension.  Neurological: She is alert.  Patient is appropriate to basic conversation during exam. She followed simple commands. She was able to provide her name and age. Noted left gaze preference is persistent.Alert.Oriented to self only,Receptive language deficits noted  Dense right homonymous hemianopsia with poor compensation  Motor strength 4+/5 in the right  deltoid, biceps, triceps, grip,3-/5 hip flexor knee extensor ankle dorsiflexor plantar flexor  5/5 on the left side. Needs cues to engage the right side.  Sensory absent LT RUE and RLE, intact on left Mood/Affect:  Appropriate  Assessment/Plan: 1. Functional deficits secondary to Left PCA embolic infarct which require 3+ hours per day of interdisciplinary therapy in a comprehensive inpatient rehab setting. Physiatrist is providing close team supervision and 24 hour management of active medical problems listed below. Physiatrist and rehab team continue to assess barriers to discharge/monitor patient progress toward functional and medical goals. FIM: FIM - Bathing Bathing Steps Patient Completed: Chest;Right Arm;Left Arm;Abdomen (declined to wash peri area and buttock) Bathing: 2: Max-Patient completes 3-4 38f 10 parts or 25-49%  FIM - Upper Body Dressing/Undressing Upper body dressing/undressing steps patient completed: Thread/unthread right sleeve of front closure shirt/dress;Thread/unthread left sleeve of front closure shirt/dress;Pull shirt around back of front closure shirt/dress;Button/unbutton shirt Upper body dressing/undressing: 5: Supervision: Safety issues/verbal cues FIM - Lower Body Dressing/Undressing Lower body dressing/undressing steps patient completed: Don/Doff left shoe (Pt assisted with left pant leg and doff left sock) Lower body dressing/undressing: 1: Total-Patient completed less than 25% of tasks (stood to pull up pants)  FIM - Toileting Toileting: 1: Two helpers  FIM - Diplomatic Services operational officer Devices: Psychiatrist Transfers: 1-Two helpers  FIM - Banker Devices: Arm rests Bed/Chair Transfer: 1: Two helpers  FIM - Locomotion: Wheelchair Distance: 25 Locomotion: Wheelchair: 1: Travels less than 50 ft with minimal assistance (Pt.>75%) FIM - Locomotion: Ambulation Locomotion: Ambulation  Assistive Devices: Designer, industrial/product Ambulation/Gait Assistance: 3: Mod assist Locomotion: Ambulation: 0: Activity did not occur  Comprehension Comprehension Mode: Auditory Comprehension: 4-Understands basic 75 - 89% of the time/requires cueing 10 - 24% of the time  Expression Expression Mode: Verbal Expression: 3-Expresses basic 50 - 74% of the time/requires cueing 25 - 50% of the time. Needs to repeat parts of sentences.  Social Interaction Social Interaction: 4-Interacts appropriately 75 - 89% of the time - Needs redirection for appropriate language or to initiate interaction.  Problem Solving Problem Solving Mode: Not assessed Problem Solving: 3-Solves basic 50 - 74% of the time/requires cueing 25 - 49% of the time  Memory Memory: 3-Recognizes or recalls 50 - 74% of the time/requires  cueing 25 - 49% of the time  Medical Problem List and Plan:  1. Embolic large left PCA infarct  2. DVT Prophylaxis/Anticoagulation: Xarelto. Monitor CBC 3. Neuropsych: This patient Is not capable of making decisions on his/her own behalf.  4. New onset atrial fibrillation. Continue Cardizem and Toprol as advised.may need to adjust dose  5. Diabetes mellitus with peripheral neuropathy. Hemoglobin A1c 6.1. SSI. gluophage on hold for elevated Creat. Recheck BMET Fair control at present. 6. Hypertension. Patient on Lotrel 10-20 daily prior to admission as well as hydrochlorothiazide 25 mg daily.   - pt is on toprol and cardizem for rate control with BP borderline  -increased cardizem to 360mg -BP and HR improved   LOS (Days) 13 A FACE TO FACE EVALUATION WAS PERFORMED  KIRSTEINS,ANDREW E 12/07/2012, 6:47 AM

## 2012-12-08 ENCOUNTER — Inpatient Hospital Stay (HOSPITAL_COMMUNITY): Payer: PRIVATE HEALTH INSURANCE | Admitting: Speech Pathology

## 2012-12-08 ENCOUNTER — Inpatient Hospital Stay (HOSPITAL_COMMUNITY): Payer: PRIVATE HEALTH INSURANCE | Admitting: Physical Therapy

## 2012-12-08 ENCOUNTER — Inpatient Hospital Stay (HOSPITAL_COMMUNITY): Payer: PRIVATE HEALTH INSURANCE | Admitting: Occupational Therapy

## 2012-12-08 ENCOUNTER — Inpatient Hospital Stay (HOSPITAL_COMMUNITY): Payer: PRIVATE HEALTH INSURANCE

## 2012-12-08 LAB — GLUCOSE, CAPILLARY
Glucose-Capillary: 148 mg/dL — ABNORMAL HIGH (ref 70–99)
Glucose-Capillary: 138 mg/dL — ABNORMAL HIGH (ref 70–99)

## 2012-12-08 NOTE — Patient Care Conference (Signed)
Inpatient RehabilitationTeam Conference and Plan of Care Update Date: 12/08/2012   Time: 11:25 Am    Patient Name: [name]      Medical Record Number: [id number]  Date of Birth: September 29, 1945 Sex: Female         Room/Bed: 4009/4009-01 Payor Info: Payor: Advertising copywriter MEDICARE  Plan: [facility]  Product Type: *No Product type*     Admitting Diagnosis: LT CVA  Admit Date/Time:  11/24/2012  4:46 PM Admission Comments: No comment available   Primary Diagnosis:  CVA (cerebral infarction) Principal Problem: CVA (cerebral infarction)  Patient Active Problem List   Diagnosis Date Noted  . CVA (cerebral infarction) 11/25/2012  . Hypertension, accelerated 11/18/2012  . New Onset Atrial fibrillation with RVR 11/18/2012  . CKD (chronic kidney disease) stage 3, GFR 30-59 ml/min 11/18/2012  . Diabetes mellitus, controlled 11/18/2012  . Headache 11/18/2012  . Acute cerebral infarction/Large acute left PCA infarct & Small acute right PCA cortical infarct involving the occipital 11/18/2012    Expected Discharge Date: Expected Discharge Date: 12/17/12  Team Members Present: Physician leading conference: Dr. Claudette Laws Social Worker Present: Dossie Der, LCSW Nurse Present: Other (comment) Yehuda Mao Hicks-RN) PT Present: Edman Circle, PT;Becky Windsor, PT;Caroline Saybrook, PT;Other (comment) Clarisse Gouge Ripa-PT) OT Present: Other (comment);Kris Gellert, OT (Kayla Perkinson-OT) SLP Present: Fae Pippin, SLP Other (Discipline and Name): Charolette Child & Steve-director     Current Status/Progress Goal Weekly Team Focus  Medical   aphasic. R field cut, bladder inc, Poor awareness of deficits  Improve communicationTo help with bladder incontinence  Speech to coordinate with nursing   Bowel/Bladder   Incontinent of bladder at time. Continent of bowel. LBM 12/07/12  Continent of bowel and bladder  Continue with time time toileting   Swallow/Nutrition/ Hydration   regular and thin   least  restrictive p.o. intake   goals met   ADL's   Max-Total for BADL and functional mobility that occasionally require +2  (no longer performing B&Dsg at bed level, includes some standing)   SUPERVISION: Self feeding, Grooming & UB dressing, MIN A: Toilet Transfer, MOD A: Bath, LB Dressing and Toileting  Pain management, forced use of RUE, visual attention and scanning to the right, sit to/from stand, standing tolerance and balance, safe bathroom transfers   Mobility   mod assist with basic stand pivot transfers; min assist w/c mobility 50 feet; ambulated 10 feet with rolling walker and mod assist  min assist basic transfers, mod assist car, min assist w/c x 50', mod assist gait x 25' controlled env, 15' home env  visual awareness, LE strengthening, functional mobility, sit to stand and ambulation   Communication   mod-max assist   supervision-mi assist  increase self-monitoring/awareness   Safety/Cognition/ Behavioral Observations  min-mod assist  supervision-min assist   increase spontaneous use of other senses to compensate for visual deficits   Pain   Scheduled Tramadol 50mg  qid  <3  Monitor for effectiveness   Skin   CDI  CDI  Routine turn q 2hrs      *See Care Plan and progress notes for long and short-term goals.  Barriers to Discharge: 24 7 caregiver will be needed    Possible Resolutions to Barriers:  SNF will be the option    Discharge Planning/Teaching Needs:  Family discussing the best plan at this time.  Daughter starting new job this week.  Unsure if there is a caregiver      Team Discussion:  Making progress, severe deficits from CVA.  Visual  awareness, aphasic and attention issues.  Family discussing NHP  Revisions to Treatment Plan:  Possible NHP   Continued Need for Acute Rehabilitation Level of Care: The patient requires daily medical management by a physician with specialized training in physical medicine and rehabilitation for the following conditions: Daily  direction of a multidisciplinary physical rehabilitation program to ensure safe treatment while eliciting the highest outcome that is of practical value to the patient.: Yes Daily medical management of patient stability for increased activity during participation in an intensive rehabilitation regime.: Yes Daily analysis of laboratory values and/or radiology reports with any subsequent need for medication adjustment of medical intervention for : Neurological problems  [name], Lemar Livings 12/08/2012, 4:20 PM

## 2012-12-08 NOTE — Progress Notes (Signed)
Speech Language Pathology Daily Session Note  Patient Details  Name: [name] MRN: [id number] Date of Birth: [birth year]  Today's Date: 12/08/2012 Time: 2536-6440 Time Calculation (min): 45 min  Short Term Goals: Week 2: SLP Short Term Goal 1 (Week 2): Patient will return use of call bell to request help with min assist verbal and visual cues SLP Short Term Goal 2 (Week 2): Patient will label common objects with mod assist semantic cues SLP Short Term Goal 3 (Week 2): Patient will follow 1-step commands with supervision assist verbal cues SLP Short Term Goal 4 (Week 2): Patient will increase awarenss of perseverative errors with mod assist verbal and visual cues  Skilled Therapeutic Interventions: Skilled treatment session focused on addressing cognitive-linguistic goals. SLP facilitated session by providing increased wait time and mod assist semantic and phonemic cues to describe photos with 2-3 word phases at times.  SLP also facilitated session with max assist cues to interact with peer patient and self-monitor verbal perseverative errors.     FIM:  Comprehension Comprehension Mode: Auditory Comprehension: 4-Understands basic 75 - 89% of the time/requires cueing 10 - 24% of the time Expression Expression Mode: Verbal Expression: 3-Expresses basic 50 - 74% of the time/requires cueing 25 - 50% of the time. Needs to repeat parts of sentences. Social Interaction Social Interaction: 4-Interacts appropriately 75 - 89% of the time - Needs redirection for appropriate language or to initiate interaction. Problem Solving Problem Solving: 3-Solves basic 50 - 74% of the time/requires cueing 25 - 49% of the time Memory Memory: 3-Recognizes or recalls 50 - 74% of the time/requires cueing 25 - 49% of the time  Pain Pain Assessment Pain Assessment: No/denies pain  Therapy/Group: Individual Therapy  Charlane Ferretti., CCC-SLP [phone]  [name] 12/08/2012, 5:14 PM

## 2012-12-08 NOTE — Progress Notes (Signed)
Physical Therapy Session Note  Patient Details  Name: [name] MRN: [id number] Date of Birth: 07-Oct-1945  Today's Date: 12/08/2012 Time: 6295-2841 Time Calculation (min): 42 min  Short Term Goals: Week 3:  PT Short Term Goal 1 (Week 3): patient will move supine to sit with supervision PT Short Term Goal 2 (Week 3): Patient will transfer bed to and from wheelchair with min assist PT Short Term Goal 3 (Week 3): Patient will move sit to stand with supervision from wheelchair PT Short Term Goal 4 (Week 3): Patient will ambulate 20 feet with rolling walker and mod assist  Skilled Therapeutic Interventions/Progress Updates:  Treatment focused on w/c mobility, gait, neuromuscular re-education via VCs, tactile cues  W/c mobility x 15' x 3 using bil UEs with occasional min assist for steering due to R visual deficits, R inattention. Pt counted out loud with therapist to keep track of UE strokes to propel, with phonemic cues.  Brakes with tactile cues.  Sit > stand with min assist.  Gait x 6' with mod assist for wt shifting, VCS for sequencing and technique, steering RW.  Pt had uncontrolled descent into w/c due to L knee buckling.  Attempted further gait in parallel bars, but pt fearful.  Motor re-ed in sitting in w/c using Kinetron, 40 cm/sec resistance, x 20 cycles, x 2.  Pt's mobility is limited by visual/perceptual deficits as well as R hip pain, bil knee pain.    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, aphasia, apraxia, visual deficits:diplopia, probable right field cut, decreased cognition Restrictions Weight Bearing Restrictions: No Other Position/Activity Restrictions: Per sister, pt had a bad right hip before this and was to have surgery, this is really painful to her now   Pain: Pain Assessment Faces Pain Scale: Hurts a little bit Pain Type: Chronic pain Pain Location: Leg Pain  Orientation: Right Pain Intervention(s): Medication (See eMAR)     Other Treatments: Treatments Neuromuscular Facilitation: Right;Upper Extremity;Lower Extremity;Forced use;Activity to increase coordination;Activity to increase timing and sequencing;Activity to increase motor control;Activity to increase sustained activation;Activity to increase lateral weight shifting;Activity to increase anterior-posterior weight shifting  See FIM for current functional status  Therapy/Group: Individual Therapy  Otisha Spickler 12/08/2012, 3:01 PM

## 2012-12-08 NOTE — Progress Notes (Signed)
Social Work Patient ID: [name], female   DOB: July 20, 1946, 67 y.o.   MRN: [id number] Spoke with daughter who reports she is visiting area facilities and will let worker know her choices. She has discussed with pt and pt's sister's and all are on the same page.  Work on discharge plan.

## 2012-12-08 NOTE — Progress Notes (Signed)
Social Work Patient ID: [name], female   DOB: 1945/08/20, 67 y.o.   MRN: [id number] Have contacted daughter to find out preference regarding NH's.  Pt is agreeable to what daughter and family want her to do. She would like to go home but realizes she needs to get better before this can happen.  Informed pt of team conference progression toward goals.

## 2012-12-08 NOTE — Progress Notes (Signed)
**Note Heather-Identified via Obfuscation** Patient ID: [name], female   DOB: 04-Jun-1946, 67 y.o.   MRN: [id number] Subjective/Complaints: 67 y.o. right-handed female with history of hypertension, diabetes mellitus with peripheral neuropathy. Admitted 11/18/2012 with headache and elevated blood pressure 172/127. EKG showed atrial fibrillation with RVR. 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 Echocardiogram with ejection fraction of 65% and no wall motion abnormalities. Carotid Dopplers with no ICA stenosis. Patient did not receive TPA. Started on Cardizem infusion for atrial fibrillation and transition to by mouth. 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 Neurology services consulted maintained on aspirin therapy as well as Xarelto.  Some dizziness with standing reported   Review of Systems  Unable to perform ROS: medical condition    Objective: Vital Signs: Blood pressure 155/90, pulse 65, temperature 97.7 F (36.5 C), temperature source Oral, resp. rate 18, height 5\' 3"  (1.6 m), weight 109.045 kg (240 lb 6.4 oz), SpO2 99.00%. No results found. Results for orders placed during the hospital encounter of 11/24/12 (from the past 72 hour(s))  GLUCOSE, CAPILLARY     Status: Abnormal   Collection Time    12/05/12  7:36 AM      Result Value Range   Glucose-Capillary 139 (*) 70 - 99 mg/dL   Comment 1 Notify RN    GLUCOSE, CAPILLARY     Status: Abnormal   Collection Time    12/05/12 11:41 AM      Result Value Range   Glucose-Capillary 139 (*) 70 - 99 mg/dL   Comment 1 Notify RN    GLUCOSE, CAPILLARY     Status: Abnormal   Collection Time    12/05/12  5:02 PM      Result Value Range   Glucose-Capillary 138 (*) 70 - 99 mg/dL   Comment 1 Notify RN    GLUCOSE, CAPILLARY     Status: Abnormal   Collection Time    12/05/12  8:56 PM      Result Value Range   Glucose-Capillary 189 (*) 70 - 99 mg/dL  GLUCOSE, CAPILLARY     Status:  Abnormal   Collection Time    12/06/12  7:18 AM      Result Value Range   Glucose-Capillary 147 (*) 70 - 99 mg/dL   Comment 1 Notify RN    GLUCOSE, CAPILLARY     Status: Abnormal   Collection Time    12/06/12 11:45 AM      Result Value Range   Glucose-Capillary 152 (*) 70 - 99 mg/dL   Comment 1 Notify RN    GLUCOSE, CAPILLARY     Status: Abnormal   Collection Time    12/06/12  5:23 PM      Result Value Range   Glucose-Capillary 140 (*) 70 - 99 mg/dL   Comment 1 Notify RN    GLUCOSE, CAPILLARY     Status: Abnormal   Collection Time    12/06/12  9:18 PM      Result Value Range   Glucose-Capillary 186 (*) 70 - 99 mg/dL  GLUCOSE, CAPILLARY     Status: Abnormal   Collection Time    12/07/12  7:34 AM      Result Value Range   Glucose-Capillary 154 (*) 70 - 99 mg/dL   Comment 1 Notify RN    CBC     Status: None   Collection Time    12/07/12  9:45 AM  Result Value Range   WBC 4.0  4.0 - 10.5 K/uL   RBC 4.44  3.87 - 5.11 MIL/uL   Hemoglobin 13.1  12.0 - 15.0 g/dL   HCT 16.1  09.6 - 04.5 %   MCV 86.7  78.0 - 100.0 fL   MCH 29.5  26.0 - 34.0 pg   MCHC 34.0  30.0 - 36.0 g/dL   RDW 40.9  81.1 - 91.4 %   Platelets 355  150 - 400 K/uL  BASIC METABOLIC PANEL     Status: Abnormal   Collection Time    12/07/12  9:45 AM      Result Value Range   Sodium 135  135 - 145 mEq/L   Potassium 3.9  3.5 - 5.1 mEq/L   Chloride 101  96 - 112 mEq/L   CO2 25  19 - 32 mEq/L   Glucose, Bld 180 (*) 70 - 99 mg/dL   BUN 28 (*) 6 - 23 mg/dL   Creatinine, Ser 7.82 (*) 0.50 - 1.10 mg/dL   Calcium 9.2  8.4 - 95.6 mg/dL   GFR calc non Af Amer 33 (*) >90 mL/min   GFR calc Af Amer 38 (*) >90 mL/min   Comment:            The eGFR has been calculated     using the CKD EPI equation.     This calculation has not been     validated in all clinical     situations.     eGFR's persistently     <90 mL/min signify     possible Chronic Kidney Disease.  GLUCOSE, CAPILLARY     Status: Abnormal    Collection Time    12/07/12 11:17 AM      Result Value Range   Glucose-Capillary 156 (*) 70 - 99 mg/dL   Comment 1 Notify RN    GLUCOSE, CAPILLARY     Status: Abnormal   Collection Time    12/07/12  4:30 PM      Result Value Range   Glucose-Capillary 145 (*) 70 - 99 mg/dL   Comment 1 Notify RN    GLUCOSE, CAPILLARY     Status: Abnormal   Collection Time    12/07/12  8:44 PM      Result Value Range   Glucose-Capillary 161 (*) 70 - 99 mg/dL   Comment 1 Notify RN        Vitals reviewed.  Gen: NAD Eyes:  Pupils reactive to light  Neck: Neck supple. No thyromegaly present.  Cardiovascular:  Irreg Irreg, no Murmur Pulmonary/Chest: Effort normal and breath sounds normal.  Abdominal: Bowel sounds are normal. She exhibits no distension.  Neurological: She is alert.  Patient is appropriate to basic conversation during exam. She followed simple commands. She was able to provide her name and age. Noted left gaze preference is persistent.Alert.Oriented to self only,Receptive language deficits noted  Dense right homonymous hemianopsia with poor compensation  Motor strength 4+/5 in the right deltoid, biceps, triceps, grip,3-/5 hip flexor knee extensor ankle dorsiflexor plantar flexor  5/5 on the left side. Needs cues to engage the right side.  Sensory absent LT RUE and RLE, intact on left Mood/Affect:  Appropriate  Assessment/Plan: 1. Functional deficits secondary to Left PCA embolic infarct which require 3+ hours per day of interdisciplinary therapy in a comprehensive inpatient rehab setting. Physiatrist is providing close team supervision and 24 hour management of active medical problems listed below. Physiatrist and rehab team continue  to assess barriers to discharge/monitor patient progress toward functional and medical goals. FIM: FIM - Bathing Bathing Steps Patient Completed: Chest;Right Arm;Left Arm;Abdomen Bathing: 2: Max-Patient completes 3-4 26f 10 parts or 25-49% (Attempted to  wash peri area & buttocks in standing)  FIM - Upper Body Dressing/Undressing Upper body dressing/undressing steps patient completed: Thread/unthread left sleeve of pullover shirt/dress;Put head through opening of pull over shirt/dress;Pull shirt over trunk Upper body dressing/undressing: 4: Min-Patient completed 75 plus % of tasks (slippery shirt with a lining so difficult to maneuver) FIM - Lower Body Dressing/Undressing Lower body dressing/undressing steps patient completed: Thread/unthread left pants leg Lower body dressing/undressing: 1: Total-Patient completed less than 25% of tasks (stood to pull up pants)  FIM - Toileting Toileting steps completed by patient:  (Attempted all tasks, just not successfully) Toileting: 1: Two helpers  FIM - Diplomatic Services operational officer Devices: Bedside commode (HD drop arm commode) Toilet Transfers: 3-To toilet/BSC: Mod A (lift or lower assist);1-Two helpers  FIM - Banker Devices: Arm rests Bed/Chair Transfer: 3: Sit > Supine: Mod A (lifting assist/Pt. 50-74%/lift 2 legs);4: Supine > Sit: Min A (steadying Pt. > 75%/lift 1 leg);3: Bed > Chair or W/C: Mod A (lift or lower assist);3: Chair or W/C > Bed: Mod A (lift or lower assist)  FIM - Locomotion: Wheelchair Distance: 50 Locomotion: Wheelchair: 2: Travels 50 - 149 ft with minimal assistance (Pt.>75%) FIM - Locomotion: Ambulation Locomotion: Ambulation Assistive Devices: Designer, industrial/product Ambulation/Gait Assistance: 3: Mod assist Locomotion: Ambulation: 1: Travels less than 50 ft with moderate assistance (Pt: 50 - 74%)  Comprehension Comprehension Mode: Auditory Comprehension: 4-Understands basic 75 - 89% of the time/requires cueing 10 - 24% of the time  Expression Expression Mode: Verbal Expression: 3-Expresses basic 50 - 74% of the time/requires cueing 25 - 50% of the time. Needs to repeat parts of sentences.  Social Interaction Social  Interaction: 4-Interacts appropriately 75 - 89% of the time - Needs redirection for appropriate language or to initiate interaction.  Problem Solving Problem Solving Mode: Not assessed Problem Solving: 2-Solves basic 25 - 49% of the time - needs direction more than half the time to initiate, plan or complete simple activities  Memory Memory: 3-Recognizes or recalls 50 - 74% of the time/requires cueing 25 - 49% of the time  Medical Problem List and Plan:  1. Embolic large left PCA infarct  2. DVT Prophylaxis/Anticoagulation: Xarelto. Monitor CBC 3. Neuropsych: This patient Is not capable of making decisions on his/her own behalf.  4. New onset atrial fibrillation. Continue Cardizem and Toprol as advised.may need to adjust dose  5. Diabetes mellitus with peripheral neuropathy. Hemoglobin A1c 6.1. SSI. gluophage on hold for elevated Creat. Recheck BMET Fair control at present. 6. Hypertension. Patient on Lotrel 10-20 daily prior to admission as well as hydrochlorothiazide 25 mg daily.   - pt is on toprol and cardizem for rate control with BP borderline  -increased cardizem to 360mg -BP and HR improved, am values high, check orthostatics   LOS (Days) 14 A FACE TO FACE EVALUATION WAS PERFORMED  Jarae Nemmers E 12/08/2012, 7:28 AM

## 2012-12-08 NOTE — Progress Notes (Signed)
Occupational Therapy Session Note  Patient Details  Name: [name] MRN: [id number] Date of Birth: Apr 23, 1946  Today's Date: 12/08/2012 Time: 0900-1000 Time Calculation (min): 60 min  Short Term Goals: Week 2:  OT Short Term Goal 1 (Week 2): Pt will sit to stand to RW with mod assist x1 to be able to pull pants over hips. OT Short Term Goal 2 (Week 2): Pt will don pants with mod assist. OT Short Term Goal 3 (Week 2): Pt will be able to transfer to a shower chair with a stand pivot using grab bars with mod assist. OT Short Term Goal 4 (Week 2): Pt will be able to sit on shower seat and bathe with mod assist. OT Short Term Goal 5 (Week 2): Patient will feed self with min assist  Skilled Therapeutic Interventions/Progress Updates:  Patient up in w/c upon arrival.  Self care retraining to include shower and dress.  Patient stood in the bathroom with mod assist using grab bar in from of her while NT removed w/c and placed rolling shower chair under patient.  Patient performed shower seated and attempted washing peri area while seated on shower seat with cut out.  Due to patient's body type and obesity, she was unable to thoroughly complete bathing peri area.  After shower, patient stood with grab bar in front of her again while the NT removed rolling shower chair and replaced it with the w/c.  Patient completed the rest of her dressing in front of the sink to include stand with min/steady assist then was supervision to pull up her pants.  Patient continues to exhibit left gaze preference, has significant visual deficits and often feeling with her hands to know what is around her.  Patient also with significant issues related to perseveration during bath and required max vcs and occasional tactile cues to move on.  Patient left in w/c with QRB in place and all items within reach.  Therapy Documentation Precautions:  Precautions Precautions: Fall Precaution Comments: pain with PROM bil knees and R  hip due to OA, aphasia, apraxia, visual deficits:diplopia, probable right field cut, decreased cognition Restrictions Weight Bearing Restrictions: No Other Position/Activity Restrictions: Per sister, pt had a bad right hip before this and was to have surgery, this is really painful to her now Pain: Denies pain, occasional pain in RLE during functional mobility.  Patient states that her Pain has been better the last few days ADL: See FIM for current functional status  Therapy/Group: Individual Therapy  Kye Hedden 12/08/2012, 11:29 AM

## 2012-12-08 NOTE — Progress Notes (Signed)
Physical Therapy Session Note  Patient Details  Name: [name] MRN: [id number] Date of Birth: May 27, 1946  Today's Date: 12/08/2012 Time: 4098-1191 Time Calculation (min): 39 min  Short Term Goals: Week 3:  PT Short Term Goal 1 (Week 3): patient will move supine to sit with supervision PT Short Term Goal 2 (Week 3): Patient will transfer bed to and from wheelchair with min assist PT Short Term Goal 3 (Week 3): Patient will move sit to stand with supervision from wheelchair PT Short Term Goal 4 (Week 3): Patient will ambulate 20 feet with rolling walker and mod assist  Skilled Therapeutic Interventions/Progress Updates:    This session focused on bed mobility supervision for supine to sit, extra time needed to get to EOB, but pt able to do it with HOB elevated and railing without external assist.  Sit to stand and stand pivot transfer to [facility] and toilet mod assist to stand (pt able to lower on her own with cues for hand placement and seat position).  Gait with RW mod assist 11'.  Assist needed to weight shift left to unweight right foot and to help steer RW straight.  WC to follow to encourage increased gait distance.    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, aphasia, apraxia, visual deficits:diplopia, probable right field cut, decreased cognition Restrictions Weight Bearing Restrictions: No Other Position/Activity Restrictions: Per sister, pt had a bad right hip before this and was to have surgery, this is really painful to her now   Pain: Pain Assessment Pain Assessment: 0-10 Pain Score:   4 Pain Type: Chronic pain Pain Location: Leg Pain Orientation: Right Pain Intervention(s): Repositioned;Ambulation/increased activity   Locomotion : Ambulation Ambulation/Gait Assistance: 3: Mod assist   See FIM for current functional status  Therapy/Group: Individual Therapy  Lurena Joiner B.  Madix Blowe, PT, DPT [phone]   12/08/2012, 9:31 AM

## 2012-12-09 ENCOUNTER — Inpatient Hospital Stay (HOSPITAL_COMMUNITY): Payer: PRIVATE HEALTH INSURANCE | Admitting: *Deleted

## 2012-12-09 ENCOUNTER — Inpatient Hospital Stay (HOSPITAL_COMMUNITY): Payer: PRIVATE HEALTH INSURANCE | Admitting: Occupational Therapy

## 2012-12-09 ENCOUNTER — Inpatient Hospital Stay (HOSPITAL_COMMUNITY): Payer: PRIVATE HEALTH INSURANCE | Admitting: Speech Pathology

## 2012-12-09 ENCOUNTER — Inpatient Hospital Stay (HOSPITAL_COMMUNITY): Payer: PRIVATE HEALTH INSURANCE

## 2012-12-09 DIAGNOSIS — G8110 Spastic hemiplegia affecting unspecified side: Secondary | ICD-10-CM

## 2012-12-09 DIAGNOSIS — I6340 Cerebral infarction due to embolism of unspecified cerebral artery: Secondary | ICD-10-CM

## 2012-12-09 DIAGNOSIS — I69921 Dysphasia following unspecified cerebrovascular disease: Secondary | ICD-10-CM

## 2012-12-09 LAB — GLUCOSE, CAPILLARY
Glucose-Capillary: 125 mg/dL — ABNORMAL HIGH (ref 70–99)
Glucose-Capillary: 167 mg/dL — ABNORMAL HIGH (ref 70–99)

## 2012-12-09 MED ORDER — GLUCERNA SHAKE PO LIQD
120.0000 mL | Freq: Four times a day (QID) | ORAL | Status: DC
  Administered 2012-12-09 – 2012-12-10 (×3): 120 mL via ORAL

## 2012-12-09 MED ORDER — TRAMADOL HCL 50 MG PO TABS
100.0000 mg | ORAL_TABLET | Freq: Three times a day (TID) | ORAL | Status: DC
  Administered 2012-12-09 – 2012-12-15 (×18): 100 mg via ORAL
  Filled 2012-12-09 (×7): qty 2
  Filled 2012-12-09: qty 1
  Filled 2012-12-09 (×7): qty 2
  Filled 2012-12-09: qty 1
  Filled 2012-12-09 (×3): qty 2

## 2012-12-09 NOTE — Progress Notes (Signed)
Speech Language Pathology Daily Session Note  Patient Details  Name: [name] MRN: [id number] Date of Birth: 08-19-1945  Today's Date: 12/09/2012 Time: 1035-1120 Time Calculation (min): 45 min  Short Term Goals: Week 2: SLP Short Term Goal 1 (Week 2): Patient will return use of call bell to request help with min assist verbal and visual cues SLP Short Term Goal 2 (Week 2): Patient will label common objects with mod assist semantic cues SLP Short Term Goal 3 (Week 2): Patient will follow 1-step commands with supervision assist verbal cues SLP Short Term Goal 4 (Week 2): Patient will increase awarenss of perseverative errors with mod assist verbal and visual cues  Skilled Therapeutic Interventions: Skilled treatment session focused on addressing language and self care goals.  SLP facilitated session with mod assist verbal and tactile cues to follow 1-step directions during transfer to bedside cammode.  SLP also facilitated session with min-mod assist semantic cues to label items within a category and mod assist semantic and phonemic cues to describe tasks that different occupations perform.      FIM:  Comprehension Comprehension Mode: Auditory Comprehension: 3-Understands basic 50 - 74% of the time/requires cueing 25 - 50%  of the time Expression Expression Mode: Verbal Expression: 3-Expresses basic 50 - 74% of the time/requires cueing 25 - 50% of the time. Needs to repeat parts of sentences. Social Interaction Social Interaction: 4-Interacts appropriately 75 - 89% of the time - Needs redirection for appropriate language or to initiate interaction. Problem Solving Problem Solving: 3-Solves basic 50 - 74% of the time/requires cueing 25 - 49% of the time Memory Memory: 3-Recognizes or recalls 50 - 74% of the time/requires cueing 25 - 49% of the time  Pain Pain Assessment Pain Assessment: No/denies pain  Therapy/Group: Individual Therapy  Charlane Ferretti.,  CCC-SLP [phone]  Arwen Haseley 12/09/2012, 11:27 AM

## 2012-12-09 NOTE — Progress Notes (Addendum)
Physical Therapy Session Note  Patient Details  Name: [name] MRN: [id number] Date of Birth: 1946-06-11  Today's Date: 12/09/2012 Time: 1123-1207 Time Calculation (min): 44 min  Short Term Goals: Week 3:  PT Short Term Goal 1 (Week 3): patient will move supine to sit with supervision PT Short Term Goal 2 (Week 3): Patient will transfer bed to and from wheelchair with min assist PT Short Term Goal 3 (Week 3): Patient will move sit to stand with supervision from wheelchair PT Short Term Goal 4 (Week 3): Patient will ambulate 20 feet with rolling walker and mod assist  Skilled Therapeutic Interventions/Progress Updates:  neuromuscular re-education via tactile and visual cues, VCS for R attention, RUE and RLE sustained activation and motor planning during functional activities of rolling w/c, gait x 6' x 17' with min/mod assist, holding a cup with R hand and drinking sips, Kinetron at 40 cm/sec x 25 cycles.  Standard, non-elevating R leg rest placed on w/c today; pt was comfortable with this.  Will need ongoing assessment of R hip pain as it relates to RLE positioning on standard leg rest.    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, aphasia, apraxia, visual deficits:diplopia, probable right field cut, decreased cognition Restrictions Weight Bearing Restrictions: No Other Position/Activity Restrictions: Per sister, pt had a bad right hip before this and was to have surgery   Pain: Pain Assessment Faces Pain Scale: Hurts a little bit Pain Location: Hip Pain Orientation: Right Pain Intervention(s): Medication (See eMAR)      Other Treatments: Treatments Neuromuscular Facilitation: Right;Upper Extremity;Lower Extremity;Forced use;Activity to increase coordination;Activity to increase timing and sequencing;Activity to increase motor control;Activity to increase sustained activation;Activity to  increase lateral weight shifting;Activity to increase anterior-posterior weight shifting  See FIM for current functional status  Therapy/Group: Individual Therapy  Sheila Gervasi 12/09/2012, 12:30 PM

## 2012-12-09 NOTE — Progress Notes (Signed)
Occupational Therapy Session Note  Patient Details  Name: [name] MRN: [id number] Date of Birth: July 01, 1946  Today's Date: 12/09/2012 Time: 0900-1010 Time Calculation (min): 70 min  Short Term Goals: Week 2:  OT Short Term Goal 1 (Week 2): Pt will sit to stand to RW with mod assist x1 to be able to pull pants over hips. OT Short Term Goal 2 (Week 2): Pt will don pants with mod assist. OT Short Term Goal 3 (Week 2): Pt will be able to transfer to a shower chair with a stand pivot using grab bars with mod assist. OT Short Term Goal 4 (Week 2): Pt will be able to sit on shower seat and bathe with mod assist. OT Short Term Goal 5 (Week 2): Patient will feed self with min assist  Skilled Therapeutic Interventions/Progress Updates:  Patient in bed upon arrival.  Engaged in self care retraining to include sponge bath at sink and dressing.  Patient supervision with bed mobility to sit EOB, Min assist for stand step transfer with walker from bed>w/c, supervision for sit><stands and standing at sink for LB bath and LB dressing.  Focused session on activity tolerance, visual scanning and attention to right, use of RUE and increased use of language during BADL task.  Patient often unsure of her environment therefore, performs task best if provided [facility] guidance to touch/feel items in her immediate space when applicable.  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, aphasia, apraxia, visual deficits:diplopia, probable right field cut, decreased cognition Restrictions Weight Bearing Restrictions: No Other Position/Activity Restrictions: Per sister, pt had a bad right hip before this and was to have surgery, this is really painful to her now General:   Vital Signs:   Pain:   ADL:   Exercises:   Other Treatments:    See FIM for current functional status  Therapy/Group: Individual Therapy  [name],  [name] 12/09/2012, 11:19 AM

## 2012-12-09 NOTE — Progress Notes (Signed)
Patient ID: [name], female   DOB: 1946/06/07, 67 y.o.   MRN: [id number] Subjective/Complaints: Remains severely aphasic Indicates R hip discomfort with gestures No falls Review of Systems  Unable to perform ROS: medical condition   Objective: Vital Signs: Blood pressure 158/81, pulse 90, temperature 98 F (36.7 C), temperature source Oral, resp. rate 20, height 5\' 3"  (1.6 m), weight 105.87 kg (233 lb 6.4 oz), SpO2 92.00%. No results found. Results for orders placed during the hospital encounter of 11/24/12 (from the past 72 hour(s))  GLUCOSE, CAPILLARY     Status: Abnormal   Collection Time    12/06/12  7:18 AM      Result Value Range   Glucose-Capillary 147 (*) 70 - 99 mg/dL   Comment 1 Notify RN    GLUCOSE, CAPILLARY     Status: Abnormal   Collection Time    12/06/12 11:45 AM      Result Value Range   Glucose-Capillary 152 (*) 70 - 99 mg/dL   Comment 1 Notify RN    GLUCOSE, CAPILLARY     Status: Abnormal   Collection Time    12/06/12  5:23 PM      Result Value Range   Glucose-Capillary 140 (*) 70 - 99 mg/dL   Comment 1 Notify RN    GLUCOSE, CAPILLARY     Status: Abnormal   Collection Time    12/06/12  9:18 PM      Result Value Range   Glucose-Capillary 186 (*) 70 - 99 mg/dL  GLUCOSE, CAPILLARY     Status: Abnormal   Collection Time    12/07/12  7:34 AM      Result Value Range   Glucose-Capillary 154 (*) 70 - 99 mg/dL   Comment 1 Notify RN    CBC     Status: None   Collection Time    12/07/12  9:45 AM      Result Value Range   WBC 4.0  4.0 - 10.5 K/uL   RBC 4.44  3.87 - 5.11 MIL/uL   Hemoglobin 13.1  12.0 - 15.0 g/dL   HCT 40.9  81.1 - 91.4 %   MCV 86.7  78.0 - 100.0 fL   MCH 29.5  26.0 - 34.0 pg   MCHC 34.0  30.0 - 36.0 g/dL   RDW 78.2  95.6 - 21.3 %   Platelets 355  150 - 400 K/uL  BASIC METABOLIC PANEL     Status: Abnormal   Collection Time    12/07/12  9:45 AM      Result Value Range   Sodium 135  135 - 145 mEq/L   Potassium 3.9  3.5 - 5.1 mEq/L    Chloride 101  96 - 112 mEq/L   CO2 25  19 - 32 mEq/L   Glucose, Bld 180 (*) 70 - 99 mg/dL   BUN 28 (*) 6 - 23 mg/dL   Creatinine, Ser 0.86 (*) 0.50 - 1.10 mg/dL   Calcium 9.2  8.4 - 57.8 mg/dL   GFR calc non Af Amer 33 (*) >90 mL/min   GFR calc Af Amer 38 (*) >90 mL/min   Comment:            The eGFR has been calculated     using the CKD EPI equation.     This calculation has not been     validated in all clinical     situations.     eGFR's persistently     <90  mL/min signify     possible Chronic Kidney Disease.  GLUCOSE, CAPILLARY     Status: Abnormal   Collection Time    12/07/12 11:17 AM      Result Value Range   Glucose-Capillary 156 (*) 70 - 99 mg/dL   Comment 1 Notify RN    GLUCOSE, CAPILLARY     Status: Abnormal   Collection Time    12/07/12  4:30 PM      Result Value Range   Glucose-Capillary 145 (*) 70 - 99 mg/dL   Comment 1 Notify RN    GLUCOSE, CAPILLARY     Status: Abnormal   Collection Time    12/07/12  8:44 PM      Result Value Range   Glucose-Capillary 161 (*) 70 - 99 mg/dL   Comment 1 Notify RN    GLUCOSE, CAPILLARY     Status: Abnormal   Collection Time    12/08/12  7:21 AM      Result Value Range   Glucose-Capillary 148 (*) 70 - 99 mg/dL  GLUCOSE, CAPILLARY     Status: Abnormal   Collection Time    12/08/12 11:34 AM      Result Value Range   Glucose-Capillary 164 (*) 70 - 99 mg/dL   Comment 1 Notify RN    GLUCOSE, CAPILLARY     Status: Abnormal   Collection Time    12/08/12  4:29 PM      Result Value Range   Glucose-Capillary 138 (*) 70 - 99 mg/dL   Comment 1 Notify RN    GLUCOSE, CAPILLARY     Status: Abnormal   Collection Time    12/08/12  8:39 PM      Result Value Range   Glucose-Capillary 151 (*) 70 - 99 mg/dL      Vitals reviewed.  Gen: NAD  Eyes:  Pupils reactive to light  Neck: Neck supple. No thyromegaly present.  Cardiovascular:  Irreg Irreg, no Murmur  Pulmonary/Chest: Effort normal and breath sounds normal.   Abdominal: Bowel sounds are normal. She exhibits no distension.  Neurological: She is alert.  Patient is appropriate to basic conversation during exam. She followed simple commands. She was able to provide her name and age. Noted left gaze preference is persistent.Alert.Oriented to self only,Receptive language deficits noted  Dense right homonymous hemianopsia with poor compensation  Motor strength 4+/5 in the right deltoid, biceps, triceps, grip,3-/5 hip flexor knee extensor ankle dorsiflexor plantar flexor  5/5 on the left side. Needs cues to engage the right side.  Sensory absent LT RUE and RLE, intact on left  Mood/Affect: Appropriate R thigh and calf without tenderness to palpation R hip pain with int/ext rotation  Assessment/Plan: 1. Functional deficits secondary to L PCA infarct which require 3+ hours per day of interdisciplinary therapy in a comprehensive inpatient rehab setting. Physiatrist is providing close team supervision and 24 hour management of active medical problems listed below. Physiatrist and rehab team continue to assess barriers to discharge/monitor patient progress toward functional and medical goals. FIM: FIM - Bathing Bathing Steps Patient Completed: Chest;Right Arm;Left Arm;Abdomen;Left upper leg Bathing: 3: Mod-Patient completes 5-7 62f 10 parts or 50-74% (Attempted to wash peri area & buttocks seated in shower)  FIM - Upper Body Dressing/Undressing Upper body dressing/undressing steps patient completed: Thread/unthread left sleeve of pullover shirt/dress;Put head through opening of pull over shirt/dress;Pull shirt over trunk;Thread/unthread right sleeve of pullover shirt/dresss;Thread/unthread right sleeve of front closure shirt/dress;Thread/unthread left sleeve of front closure shirt/dress;Pull shirt  around back of front closure shirt/dress;Button/unbutton shirt Upper body dressing/undressing: 5: Supervision: Safety issues/verbal cues FIM - Lower Body  Dressing/Undressing Lower body dressing/undressing steps patient completed: Don/Doff left sock;Pull pants up/down (pulled pants up in standing with supervision) Lower body dressing/undressing: 1: Total-Patient completed less than 25% of tasks  FIM - Toileting Toileting steps completed by patient:  (Attempted all tasks, just not successfully) Toileting: 1: Two helpers  FIM - Diplomatic Services operational officer Devices: Grab bars Toilet Transfers: 3-To toilet/BSC: Mod A (lift or lower assist);3-From toilet/BSC: Mod A (lift or lower assist)  FIM - Bed/Chair Transfer Bed/Chair Transfer Assistive Devices: Arm rests;HOB elevated;Bed rails Bed/Chair Transfer: 5: Supine > Sit: Supervision (verbal cues/safety issues);3: Bed > Chair or W/C: Mod A (lift or lower assist);3: Chair or W/C > Bed: Mod A (lift or lower assist)  FIM - Locomotion: Wheelchair Distance: 30 Locomotion: Wheelchair: 1: Travels less than 50 ft with minimal assistance (Pt.>75%) FIM - Locomotion: Ambulation Locomotion: Ambulation Assistive Devices: Designer, industrial/product Ambulation/Gait Assistance: 3: Mod assist Locomotion: Ambulation: 1: Travels less than 50 ft with moderate assistance (Pt: 50 - 74%) (6')  Comprehension Comprehension Mode: Auditory Comprehension: 4-Understands basic 75 - 89% of the time/requires cueing 10 - 24% of the time  Expression Expression Mode: Verbal Expression: 3-Expresses basic 50 - 74% of the time/requires cueing 25 - 50% of the time. Needs to repeat parts of sentences.  Social Interaction Social Interaction: 4-Interacts appropriately 75 - 89% of the time - Needs redirection for appropriate language or to initiate interaction.  Problem Solving Problem Solving Mode: Not assessed Problem Solving: 3-Solves basic 50 - 74% of the time/requires cueing 25 - 49% of the time  Memory Memory: 3-Recognizes or recalls 50 - 74% of the time/requires cueing 25 - 49% of the time  Medical Problem List and  Plan:  1. Embolic large left PCA infarct  2. DVT Prophylaxis/Anticoagulation: Xarelto. Monitor CBC  3. Neuropsych: This patient Is not capable of making decisions on his/her own behalf.  4. New onset atrial fibrillation. Continue Cardizem and Toprol as advised.may need to adjust dose  5. Diabetes mellitus with peripheral neuropathy. Hemoglobin A1c 6.1. SSI. gluophage on hold for elevated Creat. Recheck BMET Fair control at present.  6. Hypertension. Patient on Lotrel 10-20 daily prior to admission as well as hydrochlorothiazide 25 mg daily.  - pt is on toprol and cardizem for rate control with BP borderline  -increased cardizem to 360mg -BP and HR improved, am values high, check orthostatics 7.  R hip pain severe OA on Xray 10/2012, increase tramadol LOS (Days) 15 A FACE TO FACE EVALUATION WAS PERFORMED  Cresencio Reesor E 12/09/2012, 7:01 AM

## 2012-12-09 NOTE — Progress Notes (Signed)
Physical Therapy Session Note  Patient Details  Name: [name] MRN: [id number] Date of Birth: June 28, 1946  Today's Date: 12/09/2012 Time: 1405-1450 Time Calculation (min): 45 min  Short Term Goals: Week 2:  PT Short Term Goal 1 (Week 2): Pt will move supine> sit with supervision. PT Short Term Goal 1 - Progress (Week 2): Progressing toward goal (Patient requires min assist with right LE due to pain) PT Short Term Goal 2 (Week 2): Pt will transfer bed>< w/c with mod assist. PT Short Term Goal 2 - Progress (Week 2): Met PT Short Term Goal 3 (Week 2): Pt will propel w/c x 50' with min assist. PT Short Term Goal 3 - Progress (Week 2): Met PT Short Term Goal 4 (Week 2): Pt will perform gait x 10' with max assist. PT Short Term Goal 4 - Progress (Week 2): Met  Skilled Therapeutic Interventions/Progress Updates:  Tx focused on increase independence with WC mobility as well as standing activities in // bars.  Pt propelled WC x80' in controlled environment with hand-over-hand instruction for placement on rims and finding brakes. Pt needing Min A to avoid obstacles on R and maintain straight path. Pt challenged with staying inside cone pathway 2x30' with 4 collisions on R side despite visual scanning.   Sit<>stand in // bars with min A for steadying due to joint pain. Static standing x60sec in // bars for balance as well as marching and heel raises each bil x10 with increased time and effort required. Pt needing 3 seated rest breaks due to fatigue.       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, aphasia, apraxia, visual deficits:diplopia, probable right field cut, decreased cognition Restrictions Weight Bearing Restrictions: No Other Position/Activity Restrictions: Per sister, pt had a bad right hip before this and was to have surgery, this is really painful to her now  Pain: none   See FIM for  current functional status  Therapy/Group: Individual Therapy  Clydene Laming, PT, DPT 12/09/2012, 2:57 PM

## 2012-12-09 NOTE — Progress Notes (Signed)
Speech Language Pathology Weekly Progress Note  Patient Details  Name: [name] MRN: [id number] Date of Birth: [birth year]  Today's Date: 12/09/2012  Short Term Goals: Week 2: SLP Short Term Goal 1 (Week 2): Patient will return use of call bell to request help with min assist verbal and visual cues SLP Short Term Goal 1 - Progress (Week 2): Progressing toward goal SLP Short Term Goal 2 (Week 2): Patient will label common objects with mod assist semantic cues SLP Short Term Goal 2 - Progress (Week 2): Progressing toward goal SLP Short Term Goal 3 (Week 2): Patient will follow 1-step commands with supervision assist verbal cues SLP Short Term Goal 3 - Progress (Week 2): Progressing toward goal SLP Short Term Goal 4 (Week 2): Patient will increase awarenss of perseverative errors with mod assist verbal and visual cues SLP Short Term Goal 4 - Progress (Week 2): Progressing toward goal Week 3: SLP Short Term Goal 1 (Week 3): Patient will return use of call bell to request help with min assist verbal and visual cues SLP Short Term Goal 2 (Week 3): Patient will label common objects with mod assist semantic cues SLP Short Term Goal 3 (Week 3): Patient will follow 1-step commands with supervision assist verbal cues SLP Short Term Goal 4 (Week 3): Patient will increase awarenss of perseverative errors with mod assist verbal and visual cues  Weekly Progress Updates: Patient met none of her 4 short term goals this reporting period due to inconsistent abilities.  Patient's receptive and expressive language abilities fluctuate from day to day and as a result, her gains are inconsistent.  She continues to require cues to follow 1 step directions, name basic objects with semantic and phonemic cues, be awareness of verbal perseverative errors and problem solving with use of call bell.  Patient continues to required skilled SLP services to address these ongoing deficits with less clinician cuing and increased  patient carryover which will, maximize functional independence and reduce burden of care prior to discharge to SNF.   SLP Intensity: Minumum of 1-2 x/day, 30 to 90 minutes SLP Frequency: 5 out of 7 days SLP Duration/Estimated Length of Stay: 12/17/12 SLP Treatment/Interventions: Cognitive remediation/compensation;Cueing hierarchy;Environmental controls;Functional tasks;Internal/external aids;Patient/family education;Speech/Language facilitation;Therapeutic Activities  Charlane Ferretti., CCC-SLP [phone]  [name] 12/09/2012, 5:28 PM

## 2012-12-10 ENCOUNTER — Inpatient Hospital Stay (HOSPITAL_COMMUNITY): Payer: PRIVATE HEALTH INSURANCE

## 2012-12-10 ENCOUNTER — Inpatient Hospital Stay (HOSPITAL_COMMUNITY): Payer: PRIVATE HEALTH INSURANCE | Admitting: Speech Pathology

## 2012-12-10 ENCOUNTER — Inpatient Hospital Stay (HOSPITAL_COMMUNITY): Payer: PRIVATE HEALTH INSURANCE | Admitting: Occupational Therapy

## 2012-12-10 DIAGNOSIS — I69921 Dysphasia following unspecified cerebrovascular disease: Secondary | ICD-10-CM

## 2012-12-10 DIAGNOSIS — I6340 Cerebral infarction due to embolism of unspecified cerebral artery: Secondary | ICD-10-CM

## 2012-12-10 DIAGNOSIS — G8110 Spastic hemiplegia affecting unspecified side: Secondary | ICD-10-CM

## 2012-12-10 LAB — GLUCOSE, CAPILLARY: Glucose-Capillary: 164 mg/dL — ABNORMAL HIGH (ref 70–99)

## 2012-12-10 MED ORDER — ENSURE PUDDING PO PUDG
1.0000 | Freq: Three times a day (TID) | ORAL | Status: DC
  Administered 2012-12-10 – 2012-12-15 (×19): 1 via ORAL

## 2012-12-10 NOTE — Progress Notes (Signed)
Patient ID: [name], female   DOB: 1945/12/12, 67 y.o.   MRN: [id number] Subjective/Complaints:  C/O swallowing problems, trying to clear throat , has applesauce and drink at bedside  Review of Systems  Unable to perform ROS: medical condition   Objective: Vital Signs: Blood pressure 144/97, pulse 68, temperature 98 F (36.7 C), temperature source Oral, resp. rate 20, height 5\' 3"  (1.6 m), weight 105.87 kg (233 lb 6.4 oz), SpO2 99.00%. No results found. Results for orders placed during the hospital encounter of 11/24/12 (from the past 72 hour(s))  GLUCOSE, CAPILLARY     Status: Abnormal   Collection Time    12/07/12  7:34 AM      Result Value Range   Glucose-Capillary 154 (*) 70 - 99 mg/dL   Comment 1 Notify RN    CBC     Status: None   Collection Time    12/07/12  9:45 AM      Result Value Range   WBC 4.0  4.0 - 10.5 K/uL   RBC 4.44  3.87 - 5.11 MIL/uL   Hemoglobin 13.1  12.0 - 15.0 g/dL   HCT 40.9  81.1 - 91.4 %   MCV 86.7  78.0 - 100.0 fL   MCH 29.5  26.0 - 34.0 pg   MCHC 34.0  30.0 - 36.0 g/dL   RDW 78.2  95.6 - 21.3 %   Platelets 355  150 - 400 K/uL  BASIC METABOLIC PANEL     Status: Abnormal   Collection Time    12/07/12  9:45 AM      Result Value Range   Sodium 135  135 - 145 mEq/L   Potassium 3.9  3.5 - 5.1 mEq/L   Chloride 101  96 - 112 mEq/L   CO2 25  19 - 32 mEq/L   Glucose, Bld 180 (*) 70 - 99 mg/dL   BUN 28 (*) 6 - 23 mg/dL   Creatinine, Ser 0.86 (*) 0.50 - 1.10 mg/dL   Calcium 9.2  8.4 - 57.8 mg/dL   GFR calc non Af Amer 33 (*) >90 mL/min   GFR calc Af Amer 38 (*) >90 mL/min   Comment:            The eGFR has been calculated     using the CKD EPI equation.     This calculation has not been     validated in all clinical     situations.     eGFR's persistently     <90 mL/min signify     possible Chronic Kidney Disease.  GLUCOSE, CAPILLARY     Status: Abnormal   Collection Time    12/07/12 11:17 AM      Result Value Range   Glucose-Capillary  156 (*) 70 - 99 mg/dL   Comment 1 Notify RN    GLUCOSE, CAPILLARY     Status: Abnormal   Collection Time    12/07/12  4:30 PM      Result Value Range   Glucose-Capillary 145 (*) 70 - 99 mg/dL   Comment 1 Notify RN    GLUCOSE, CAPILLARY     Status: Abnormal   Collection Time    12/07/12  8:44 PM      Result Value Range   Glucose-Capillary 161 (*) 70 - 99 mg/dL   Comment 1 Notify RN    GLUCOSE, CAPILLARY     Status: Abnormal   Collection Time    12/08/12  7:21 AM  Result Value Range   Glucose-Capillary 148 (*) 70 - 99 mg/dL  GLUCOSE, CAPILLARY     Status: Abnormal   Collection Time    12/08/12 11:34 AM      Result Value Range   Glucose-Capillary 164 (*) 70 - 99 mg/dL   Comment 1 Notify RN    GLUCOSE, CAPILLARY     Status: Abnormal   Collection Time    12/08/12  4:29 PM      Result Value Range   Glucose-Capillary 138 (*) 70 - 99 mg/dL   Comment 1 Notify RN    GLUCOSE, CAPILLARY     Status: Abnormal   Collection Time    12/08/12  8:39 PM      Result Value Range   Glucose-Capillary 151 (*) 70 - 99 mg/dL  GLUCOSE, CAPILLARY     Status: Abnormal   Collection Time    12/09/12  7:12 AM      Result Value Range   Glucose-Capillary 124 (*) 70 - 99 mg/dL  GLUCOSE, CAPILLARY     Status: Abnormal   Collection Time    12/09/12 11:26 AM      Result Value Range   Glucose-Capillary 125 (*) 70 - 99 mg/dL   Comment 1 Notify RN    GLUCOSE, CAPILLARY     Status: Abnormal   Collection Time    12/09/12  4:32 PM      Result Value Range   Glucose-Capillary 158 (*) 70 - 99 mg/dL   Comment 1 Notify RN    GLUCOSE, CAPILLARY     Status: Abnormal   Collection Time    12/09/12 10:59 PM      Result Value Range   Glucose-Capillary 167 (*) 70 - 99 mg/dL      Vitals reviewed.  Gen: NAD  Eyes:  Pupils reactive to light  Neck: Neck supple. No thyromegaly present.  Cardiovascular:  Irreg Irreg, no Murmur  Pulmonary/Chest: Effort normal and breath sounds normal.  Abdominal: Bowel  sounds are normal. She exhibits no distension.  Neurological: She is alert.  Patient is appropriate to basic conversation during exam. She followed simple commands. She was able to provide her name and age. Noted left gaze preference is persistent.Alert.Oriented to self only,Receptive language deficits noted  Dense right homonymous hemianopsia with poor compensation  Motor strength 4+/5 in the right deltoid, biceps, triceps, grip,3-/5 hip flexor knee extensor ankle dorsiflexor plantar flexor  5/5 on the left side. Needs cues to engage the right side.  Sensory absent LT RUE and RLE, intact on left  Mood/Affect: Appropriate R thigh and calf without tenderness to palpation R hip pain with int/ext rotation  Assessment/Plan: 1. Functional deficits secondary to L PCA infarct which require 3+ hours per day of interdisciplinary therapy in a comprehensive inpatient rehab setting. Physiatrist is providing close team supervision and 24 hour management of active medical problems listed below. Physiatrist and rehab team continue to assess barriers to discharge/monitor patient progress toward functional and medical goals. FIM: FIM - Bathing Bathing Steps Patient Completed: Chest;Right Arm;Left Arm;Abdomen;Left upper leg;Front perineal area;Left lower leg (including foot);Right upper leg Bathing: 4: Min-Patient completes 8-9 43f 10 parts or 75+ percent (performed in sit and stand)  FIM - Upper Body Dressing/Undressing Upper body dressing/undressing steps patient completed: Thread/unthread right sleeve of front closure shirt/dress;Thread/unthread left sleeve of front closure shirt/dress;Pull shirt around back of front closure shirt/dress;Button/unbutton shirt Upper body dressing/undressing: 5: Set-up assist to: Obtain clothing/put away FIM - Lower Body Dressing/Undressing Lower  body dressing/undressing steps patient completed: Pull pants up/down;Don/Doff left shoe;Fasten/unfasten left shoe;Thread/unthread  left pants leg (pulled pants up in standing with supervision) Lower body dressing/undressing: 3: Mod-Patient completed 50-74% of tasks (performed in sit and stand)  FIM - Toileting Toileting steps completed by patient:  (Attempted all tasks, just not successfully) Toileting: 1: Two helpers  FIM - Diplomatic Services operational officer Devices: Grab bars Toilet Transfers: 3-To toilet/BSC: Mod A (lift or lower assist);3-From toilet/BSC: Mod A (lift or lower assist)  FIM - Bed/Chair Transfer Bed/Chair Transfer Assistive Devices: Arm rests;HOB elevated;Bed rails Bed/Chair Transfer: 0: Activity did not occur  FIM - Locomotion: Wheelchair Distance: 80 Locomotion: Wheelchair: 2: Travels 50 - 149 ft with minimal assistance (Pt.>75%) FIM - Locomotion: Ambulation Locomotion: Ambulation Assistive Devices: Designer, industrial/product Ambulation/Gait Assistance: 3: Mod assist Locomotion: Ambulation: 0: Activity did not occur  Comprehension Comprehension Mode: Auditory Comprehension: 4-Understands basic 75 - 89% of the time/requires cueing 10 - 24% of the time  Expression Expression Mode: Verbal Expression: 3-Expresses basic 50 - 74% of the time/requires cueing 25 - 50% of the time. Needs to repeat parts of sentences.  Social Interaction Social Interaction: 4-Interacts appropriately 75 - 89% of the time - Needs redirection for appropriate language or to initiate interaction.  Problem Solving Problem Solving Mode: Not assessed Problem Solving: 3-Solves basic 50 - 74% of the time/requires cueing 25 - 49% of the time  Memory Memory: 3-Recognizes or recalls 50 - 74% of the time/requires cueing 25 - 49% of the time  Medical Problem List and Plan:  1. Embolic large left PCA infarct  2. DVT Prophylaxis/Anticoagulation: Xarelto. Monitor CBC  3. Neuropsych: This patient Is not capable of making decisions on his/her own behalf.  4. New onset atrial fibrillation. Continue Cardizem and Toprol as  advised.may need to adjust dose  5. Diabetes mellitus with peripheral neuropathy. Hemoglobin A1c 6.1. SSI. gluophage on hold for elevated Creat. Recheck BMET Fair control at present.  6. Hypertension. Patient on Lotrel 10-20 daily prior to admission as well as hydrochlorothiazide 25 mg daily.  - pt is on toprol and cardizem for rate control with BP borderline  -increased cardizem to 360mg -BP and HR improved, am values high, check orthostatics 7.  R hip pain severe OA on Xray 10/2012, increase tramadol 8.  Dysphagia, will ask SLP to reassess LOS (Days) 16 A FACE TO FACE EVALUATION WAS PERFORMED  KIRSTEINS,ANDREW E 12/10/2012, 6:53 AM

## 2012-12-10 NOTE — Progress Notes (Signed)
Occupational Therapy Session Note  Patient Details  Name: Benicia Bergevin MRN: [id number] Date of Birth: [birth year]  Today's Date: 12/10/2012 Time: 1430-1500 Time Calculation (min): 30 min  Short Term Goals: Week 3:  OT Short Term Goal 1 (Week 3): Unmet LTGs of overall Mod Assist for BADLs and Min assist for toilet transfers  Skilled Therapeutic Interventions/Progress Updates:    Therapy session focused on visual scanning and motor planning while sitting unsupported on mat table. Pt required vc's to locate cones placed on contrasting chair on Rt side. Pt relied more on touch to locate and OT provided constant cues to turn head to scan for cones. Once located pt had decreased motor planning to reach out with Rt hand to pick up items then reach across body to place on top of cone on Lt side. Pt then reached for cone on Lt side and asked to place on stacked cones on Rt side using Rt hand throughout process. Continued to demonstrated impaired motor planning and visual scanning as she relies on touch. HOH assist provided on 2 occasions to place cones on stack at Rt side. Pt cued to visually follow cones as she brought it across body to increase sustained attention and visual scanning.   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, aphasia, apraxia, visual deficits:diplopia, probable right field cut, decreased cognition Restrictions Weight Bearing Restrictions: No Other Position/Activity Restrictions: Per sister, pt had a bad right hip before this and was to have surgery, this is really painful to her now General:   Vital Signs:   Pain:   No c/o pain during therapy session.   See FIM for current functional status  Therapy/Group: Individual Therapy  Daneil Dan 12/10/2012, 3:13 PM

## 2012-12-10 NOTE — Progress Notes (Signed)
Physical Therapy Session Note  Patient Details  Name: [name] MRN: [id number] Date of Birth: [birth year]  Today's Date: 12/10/2012 Time: 1300-1400 Time Calculation (min): 60 min  Short Term Goals: Week 2:  PT Short Term Goal 1 (Week 2): Pt will move supine> sit with supervision. PT Short Term Goal 1 - Progress (Week 2): Progressing toward goal (Patient requires min assist with right LE due to pain) PT Short Term Goal 2 (Week 2): Pt will transfer bed>< w/c with mod assist. PT Short Term Goal 2 - Progress (Week 2): Met PT Short Term Goal 3 (Week 2): Pt will propel w/c x 50' with min assist. PT Short Term Goal 3 - Progress (Week 2): Met PT Short Term Goal 4 (Week 2): Pt will perform gait x 10' with max assist. PT Short Term Goal 4 - Progress (Week 2): Met Week 3:  PT Short Term Goal 1 (Week 3): patient will move supine to sit with supervision PT Short Term Goal 2 (Week 3): Patient will transfer bed to and from wheelchair with min assist PT Short Term Goal 3 (Week 3): Patient will move sit to stand with supervision from wheelchair PT Short Term Goal 4 (Week 3): Patient will ambulate 20 feet with rolling walker and mod assist  Skilled Therapeutic Interventions/Progress Updates:  Patient sitting in wheelchair upon entering room. Patient reports she needs to go to the bathroom. Patient ambulated 8 feet with rolling walker with min to occasional mod assist with guiding walker. Patient required assist with clothes manipulation prior to and after toileting. Patient performed peri hygiene. Patient required assist with sit to stand from toilet and ambulated with rolling walker 8 feet to wheelchair with min to mod assist. Patient sit to stand to walker with min assist every time. Patient ambulated with RW using orange cones to guide for vision 22 feet x 2 with min to occasional mod assist. Patient exercised on Nustep x 5 minutes of workload 4 for coordination, strength, and endurance all extremities.  Patient left in wheelchair with items in reach.  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, aphasia, apraxia, visual deficits:diplopia, probable right field cut, decreased cognition Restrictions Weight Bearing Restrictions: No Other Position/Activity Restrictions: Per sister, pt had a bad right hip before this and was to have surgery, this is really painful to her now Pain: Pain Assessment Pain Assessment: 0-10 Pain Score:   5 Pain Location: Hip Pain Orientation: Right Locomotion : Ambulation Ambulation/Gait Assistance: 3: Mod assist   See FIM for current functional status  Therapy/Group: Individual Therapy  Alma Friendly 12/10/2012, 3:36 PM

## 2012-12-10 NOTE — Progress Notes (Signed)
Occupational Therapy Weekly Progress Note  Patient Details  Name: [name] MRN: [id number] Date of Birth: 05-28-1946  Today's Date: 12/10/2012 Time: 4098-1191 Time Calculation (min): 60 min  Patient has met 3 of 5 short term goals.  Patient has made gains toward LTGs this reporting period.  She has included standing to perform LB bath and dress tasks, has been in the shower one time, has made improvements with all of her functional mobility during BADL tasks.   Patient continues to demonstrate the following deficits: right hemiparesis, sever RLE orthopedic pain, visual deficits of blurry vision, diplopia, and impaired visual fields with left gaze preference, perseveration, motor planning, cognition and mixed aphasia therefore, patient will continue to benefit from skilled OT intervention to enhance overall performance with BADL and Reduce care partner burden.  Patient progressing toward long term goals.  Continue plan of care.  OT Short Term Goals Week 2:   OT Short Term Goal 1 (Week 2): Pt will sit to stand to RW with mod assist x1 to be able to pull pants over hips. OT Short Term Goal 1 - Progress (Week 2): Met OT Short Term Goal 2 (Week 2): Pt will don pants with mod assist. OT Short Term Goal 2 - Progress (Week 2): Met OT Short Term Goal 3 (Week 2): Pt will be able to transfer to a shower chair with a stand pivot using grab bars with mod assist. OT Short Term Goal 3 - Progress (Week 2): Partly met (Mod assist for sit><stand using grab bar for DME switch) OT Short Term Goal 4 (Week 2): Pt will be able to sit on shower seat and bathe with mod assist. OT Short Term Goal 4 - Progress (Week 2): Partly met (Mod assist bathing goal met however not when in shower) OT Short Term Goal 5 (Week 2): Patient will feed self with min assist OT Short Term Goal 5 - Progress (Week 2): Met Week 3:  OT Short Term Goal 1 (Week 3): Unmet LTGs of overall Mod Assist for BADLs and Min assist for toilet  transfers  Skilled Therapeutic Interventions/Progress Updates:  Patient in bed upon arrival and declined shower this AM therefore performed sponge bath at sink and dressing.  Focused session on bed mobility, stand step transfer with RW to w/c, sit><stands, standing tolerance and standing balance, visual scanning and attention to right, adaptive techniques to decrease level of assistance to include use hands to touch/feel items in her environment and use of a stool to prop feet on for LB bath and dress.    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, aphasia, apraxia, visual deficits:diplopia, probable right field cut, decreased cognition Restrictions Weight Bearing Restrictions: No Other Position/Activity Restrictions: Per sister, pt had a bad right hip before this and was to have surgery, this is really painful to her now Pain: Occasionally reports LLE pain during mobility ADL: See FIM for current functional status  Therapy/Group: Individual Therapy  Kattleya Kuhnert 12/10/2012, 12:02 PM

## 2012-12-10 NOTE — Evaluation (Addendum)
Bedside Swallow Evaluation  Patient Details  Name: [name] MRN: [id number] Date of Birth: 08/18/1945  SLP Diagnosis: Aphasia;Cognitive Impairments;Dysphagia  Rehab Potential: Good ELOS: 12/17/12   Today's Date: 12/10/2012 Time: 4098-1191 Time Calculation (min): 45 min  Problem List:  Patient Active Problem List   Diagnosis Date Noted  . CVA (cerebral infarction) 11/25/2012  . Hypertension, accelerated 11/18/2012  . New Onset Atrial fibrillation with RVR 11/18/2012  . CKD (chronic kidney disease) stage 3, GFR 30-59 ml/min 11/18/2012  . Diabetes mellitus, controlled 11/18/2012  . Headache 11/18/2012  . Acute cerebral infarction/Large acute left PCA infarct & Small acute right PCA cortical infarct involving the occipital 11/18/2012   Past Medical History:  Past Medical History  Diagnosis Date  . Hypertension   . Diabetes mellitus   . Glaucoma   . Cataract    Past Surgical History: No past surgical history on file.  Assessment / Plan / Recommendation Clinical Impression  Please see Assessment and Plan for history and hospital course.  SLP received orders today for Bedside Swallow per RN report patient exhibited difficulty swallowing during medication administration this am.  Patient consumed regular textures with efficient mastication and no overt s/s of aspiration.  Patient consumed thin liquids via straw with intermittent delayed throat clears.  Patient reported that it happens most with "the sweet stuff."  Trials on Glucerna resulted immediate throat clears and bad facial expressions.  SLP suspects this occurs as a result of patient preference.  Increased sensation in combination with large straw sips could also account for delayed throat clears with sips of thin.  Pill could not be assessed during evaluation; however, it is recommended that she consume them crushed in puree to reduce risk.  With observation of belching during trials it is also recommended that MD consider adding  a medication to assist with managing reflux.    SLP initiated treatment by educating patient regarding not using a straw with max assist multi-modal cues due to receptive deficits.  Patient also required max assist cues to cease perseveration of throat clearing.       SLP Assessment  Patient will need skilled Speech Lanaguage Pathology Services during CIR admission    Recommendations  Diet Recommendations: Regular;Thin liquid Liquid Administration via: Cup;No straw Medication Administration: Crushed with puree Supervision: Patient able to self feed;Intermittent supervision to cue for compensatory strategies Compensations: Slow rate;Small sips/bites Postural Changes and/or Swallow Maneuvers: Seated upright 90 degrees Oral Care Recommendations: Oral care BID Patient destination: Skilled Nursing Facility (SNF) Follow up Recommendations: 24 hour supervision/assistance;Skilled Nursing facility Equipment Recommended: None recommended by SLP    SLP Frequency 5 out of 7 days   SLP Treatment/Interventions Cognitive remediation/compensation;Cueing hierarchy;Environmental controls;Functional tasks;Internal/external aids;Patient/family education;Speech/Language facilitation;Therapeutic Activities;Dysphagia/aspiration precaution training    Pain  0 Prior Functioning  WFL  Short Term Goals: Week 2: SLP Short Term Goal 1 (Week 2): Patient will return use of call bell to request help with min assist verbal and visual cues SLP Short Term Goal 1 - Progress (Week 2): Progressing toward goal SLP Short Term Goal 2 (Week 2): Patient will label common objects with mod assist semantic cues SLP Short Term Goal 2 - Progress (Week 2): Progressing toward goal SLP Short Term Goal 3 (Week 2): Patient will follow 1-step commands with supervision assist verbal cues SLP Short Term Goal 3 - Progress (Week 2): Progressing toward goal SLP Short Term Goal 4 (Week 2): Patient will increase awarenss of perseverative  errors with mod assist verbal and visual  cues SLP Short Term Goal 4 - Progress (Week 2): Progressing toward goal  See FIM for current functional status Refer to Care Plan for Long Term Goals  Recommendations for other services: None  Discharge Criteria: Patient will be discharged from SLP if patient refuses treatment 3 consecutive times without medical reason, if treatment goals not met, if there is a change in medical status, if patient makes no progress towards goals or if patient is discharged from hospital.  The above assessment, treatment plan, treatment alternatives and goals were discussed and mutually agreed upon: by patient  Fae Pippin, M.A., CCC-SLP [phone]  [name] 12/10/2012, 12:18 PM

## 2012-12-11 LAB — GLUCOSE, CAPILLARY
Glucose-Capillary: 160 mg/dL — ABNORMAL HIGH (ref 70–99)
Glucose-Capillary: 134 mg/dL — ABNORMAL HIGH (ref 70–99)

## 2012-12-11 MED ORDER — HYDROCHLOROTHIAZIDE 12.5 MG PO CAPS
12.5000 mg | ORAL_CAPSULE | Freq: Every day | ORAL | Status: DC
  Administered 2012-12-11: 12.5 mg via ORAL
  Filled 2012-12-11 (×4): qty 1

## 2012-12-11 MED ORDER — CITALOPRAM HYDROBROMIDE 10 MG PO TABS
10.0000 mg | ORAL_TABLET | Freq: Every day | ORAL | Status: DC
  Administered 2012-12-11 – 2012-12-15 (×5): 10 mg via ORAL
  Filled 2012-12-11 (×8): qty 1

## 2012-12-11 NOTE — Progress Notes (Signed)
Patient ID: Delpha Perko, female   DOB: [birth year], 67 y.o.   MRN: [id number] Subjective/Complaints:  Upset about stroke,"can't do anything"  Review of Systems  Unable to perform ROS: medical condition   Objective: Vital Signs: Blood pressure 123/77, pulse 64, temperature 97.3 F (36.3 C), temperature source Oral, resp. rate 16, height 5\' 3"  (1.6 m), weight 105.87 kg (233 lb 6.4 oz), SpO2 99.00%. No results found. Results for orders placed during the hospital encounter of 11/24/12 (from the past 72 hour(s))  GLUCOSE, CAPILLARY     Status: Abnormal   Collection Time    12/08/12 11:34 AM      Result Value Range   Glucose-Capillary 164 (*) 70 - 99 mg/dL   Comment 1 Notify RN    GLUCOSE, CAPILLARY     Status: Abnormal   Collection Time    12/08/12  4:29 PM      Result Value Range   Glucose-Capillary 138 (*) 70 - 99 mg/dL   Comment 1 Notify RN    GLUCOSE, CAPILLARY     Status: Abnormal   Collection Time    12/08/12  8:39 PM      Result Value Range   Glucose-Capillary 151 (*) 70 - 99 mg/dL  GLUCOSE, CAPILLARY     Status: Abnormal   Collection Time    12/09/12  7:12 AM      Result Value Range   Glucose-Capillary 124 (*) 70 - 99 mg/dL  GLUCOSE, CAPILLARY     Status: Abnormal   Collection Time    12/09/12 11:26 AM      Result Value Range   Glucose-Capillary 125 (*) 70 - 99 mg/dL   Comment 1 Notify RN    GLUCOSE, CAPILLARY     Status: Abnormal   Collection Time    12/09/12  4:32 PM      Result Value Range   Glucose-Capillary 158 (*) 70 - 99 mg/dL   Comment 1 Notify RN    GLUCOSE, CAPILLARY     Status: Abnormal   Collection Time    12/09/12 10:59 PM      Result Value Range   Glucose-Capillary 167 (*) 70 - 99 mg/dL  GLUCOSE, CAPILLARY     Status: Abnormal   Collection Time    12/10/12  7:17 AM      Result Value Range   Glucose-Capillary 142 (*) 70 - 99 mg/dL  GLUCOSE, CAPILLARY     Status: Abnormal   Collection Time    12/10/12 11:16 AM      Result Value Range    Glucose-Capillary 180 (*) 70 - 99 mg/dL  GLUCOSE, CAPILLARY     Status: Abnormal   Collection Time    12/10/12  9:40 PM      Result Value Range   Glucose-Capillary 164 (*) 70 - 99 mg/dL   Comment 1 Notify RN        Vitals reviewed.  Gen: NAD  Eyes:  Pupils reactive to light  Neck: Neck supple. No thyromegaly present.  Cardiovascular:  Irreg Irreg, no Murmur  Pulmonary/Chest: Effort normal and breath sounds normal.  Abdominal: Bowel sounds are normal. She exhibits no distension.  Neurological: She is alert.  Patient is appropriate to basic conversation during exam. She followed simple commands. She was able to provide her name and age. Noted left gaze preference is persistent.Alert.Oriented to self only,Receptive language deficits noted  Dense right homonymous hemianopsia with poor compensation  Motor strength 4+/5 in the right deltoid, biceps, triceps, grip,3-/5  hip flexor knee extensor ankle dorsiflexor plantar flexor  5/5 on the left side. Needs cues to engage the right side.  Sensory absent LT RUE and RLE, intact on left  Mood/Affect: Labile crying R thigh and calf without tenderness to palpation R hip pain with int/ext rotation  Assessment/Plan: 1. Functional deficits secondary to L PCA infarct which require 3+ hours per day of interdisciplinary therapy in a comprehensive inpatient rehab setting. Physiatrist is providing close team supervision and 24 hour management of active medical problems listed below. Physiatrist and rehab team continue to assess barriers to discharge/monitor patient progress toward functional and medical goals. FIM: FIM - Bathing Bathing Steps Patient Completed: Chest;Right Arm;Left Arm;Abdomen;Left upper leg;Front perineal area;Left lower leg (including foot);Right upper leg Bathing: 4: Min-Patient completes 8-9 40f 10 parts or 75+ percent (performed in sit and stand)  FIM - Upper Body Dressing/Undressing Upper body dressing/undressing steps patient  completed: Thread/unthread right sleeve of front closure shirt/dress;Thread/unthread left sleeve of front closure shirt/dress;Pull shirt around back of front closure shirt/dress Upper body dressing/undressing: 4: Min-Patient completed 75 plus % of tasks FIM - Lower Body Dressing/Undressing Lower body dressing/undressing steps patient completed: Pull pants up/down;Don/Doff left shoe;Fasten/unfasten left shoe;Thread/unthread left pants leg;Thread/unthread right pants leg (pulled pants up in standing with supervision) Lower body dressing/undressing: 3: Mod-Patient completed 50-74% of tasks (wearing brief and performed in sit and stand)  FIM - Toileting Toileting steps completed by patient: Performs perineal hygiene Toileting: 2: Max-Patient completed 1 of 3 steps  FIM - Diplomatic Services operational officer Devices: Grab bars;Walker Toilet Transfers: 4-To toilet/BSC: Min A (steadying Pt. > 75%);3-From toilet/BSC: Mod A (lift or lower assist)  FIM - Bed/Chair Transfer Bed/Chair Transfer Assistive Devices: Arm rests;HOB elevated;Bed rails Bed/Chair Transfer: 0: Activity did not occur  FIM - Locomotion: Wheelchair Distance: 80 Locomotion: Wheelchair: 2: Travels 50 - 149 ft with minimal assistance (Pt.>75%) FIM - Locomotion: Ambulation Locomotion: Ambulation Assistive Devices: Designer, industrial/product Ambulation/Gait Assistance: 3: Mod assist Locomotion: Ambulation: 1: Travels less than 50 ft with moderate assistance (Pt: 50 - 74%)  Comprehension Comprehension Mode: Auditory Comprehension: 4-Understands basic 75 - 89% of the time/requires cueing 10 - 24% of the time  Expression Expression Mode: Verbal Expression: 3-Expresses basic 50 - 74% of the time/requires cueing 25 - 50% of the time. Needs to repeat parts of sentences.  Social Interaction Social Interaction: 4-Interacts appropriately 75 - 89% of the time - Needs redirection for appropriate language or to initiate  interaction.  Problem Solving Problem Solving Mode: Not assessed Problem Solving: 3-Solves basic 50 - 74% of the time/requires cueing 25 - 49% of the time  Memory Memory: 3-Recognizes or recalls 50 - 74% of the time/requires cueing 25 - 49% of the time  Medical Problem List and Plan:  1. Embolic large left PCA infarct  2. DVT Prophylaxis/Anticoagulation: Xarelto. Monitor CBC  3. Neuropsych: This patient Is not capable of making decisions on his/her own behalf.  4. New onset atrial fibrillation. Continue Cardizem and Toprol as advised.may need to adjust dose  5. Diabetes mellitus with peripheral neuropathy. Hemoglobin A1c 6.1. SSI. gluophage on hold for elevated Creat. Recheck BMET Fair control at present.  6. Hypertension. Patient on Lotrel 10-20 daily prior to admission as well as hydrochlorothiazide 25 mg daily. Will resume low dose HCTZ since BPs trending up and intake improved - pt is on toprol and cardizem for rate control with BP borderline  -increased cardizem to 360mg -BP and HR improved, am values high, check orthostatics  7.  R hip pain severe OA on Xray 10/2012, increase tramadol 8.  Dysphagia, will ask SLP to reassess LOS (Days) 17 A FACE TO FACE EVALUATION WAS PERFORMED  KIRSTEINS,ANDREW E 12/11/2012, 7:24 AM

## 2012-12-12 ENCOUNTER — Inpatient Hospital Stay (HOSPITAL_COMMUNITY): Payer: PRIVATE HEALTH INSURANCE

## 2012-12-12 LAB — GLUCOSE, CAPILLARY
Glucose-Capillary: 121 mg/dL — ABNORMAL HIGH (ref 70–99)
Glucose-Capillary: 175 mg/dL — ABNORMAL HIGH (ref 70–99)

## 2012-12-12 MED ORDER — DOXAZOSIN MESYLATE 1 MG PO TABS
1.0000 mg | ORAL_TABLET | Freq: Every day | ORAL | Status: DC
  Administered 2012-12-12 – 2012-12-14 (×3): 1 mg via ORAL
  Filled 2012-12-12 (×4): qty 1

## 2012-12-12 NOTE — Progress Notes (Signed)
Patient ID: [name], female   DOB: 1946-05-16, 67 y.o.   MRN: [id number] Subjective/Complaints:  Upset about stroke,"can't do anything"  Review of Systems  Unable to perform ROS: medical condition   Objective: Vital Signs: Blood pressure 166/96, pulse 75, temperature 98.2 F (36.8 C), temperature source Oral, resp. rate 18, height 5\' 3"  (1.6 m), weight 105.87 kg (233 lb 6.4 oz), SpO2 100.00%. No results found. Results for orders placed during the hospital encounter of 11/24/12 (from the past 72 hour(s))  GLUCOSE, CAPILLARY     Status: Abnormal   Collection Time    12/09/12 11:26 AM      Result Value Range   Glucose-Capillary 125 (*) 70 - 99 mg/dL   Comment 1 Notify RN    GLUCOSE, CAPILLARY     Status: Abnormal   Collection Time    12/09/12  4:32 PM      Result Value Range   Glucose-Capillary 158 (*) 70 - 99 mg/dL   Comment 1 Notify RN    GLUCOSE, CAPILLARY     Status: Abnormal   Collection Time    12/09/12 10:59 PM      Result Value Range   Glucose-Capillary 167 (*) 70 - 99 mg/dL  GLUCOSE, CAPILLARY     Status: Abnormal   Collection Time    12/10/12  7:17 AM      Result Value Range   Glucose-Capillary 142 (*) 70 - 99 mg/dL  GLUCOSE, CAPILLARY     Status: Abnormal   Collection Time    12/10/12 11:16 AM      Result Value Range   Glucose-Capillary 180 (*) 70 - 99 mg/dL  GLUCOSE, CAPILLARY     Status: Abnormal   Collection Time    12/10/12  4:30 PM      Result Value Range   Glucose-Capillary 160 (*) 70 - 99 mg/dL   Comment 1 Notify RN    GLUCOSE, CAPILLARY     Status: Abnormal   Collection Time    12/10/12  9:40 PM      Result Value Range   Glucose-Capillary 164 (*) 70 - 99 mg/dL   Comment 1 Notify RN    GLUCOSE, CAPILLARY     Status: Abnormal   Collection Time    12/11/12  7:20 AM      Result Value Range   Glucose-Capillary 134 (*) 70 - 99 mg/dL   Comment 1 Notify RN    GLUCOSE, CAPILLARY     Status: Abnormal   Collection Time    12/11/12 11:28 AM   Result Value Range   Glucose-Capillary 195 (*) 70 - 99 mg/dL   Comment 1 Notify RN    GLUCOSE, CAPILLARY     Status: Abnormal   Collection Time    12/11/12  4:25 PM      Result Value Range   Glucose-Capillary 117 (*) 70 - 99 mg/dL   Comment 1 Notify RN    GLUCOSE, CAPILLARY     Status: Abnormal   Collection Time    12/11/12  8:45 PM      Result Value Range   Glucose-Capillary 155 (*) 70 - 99 mg/dL   Comment 1 Notify RN        Vitals reviewed.  Gen: NAD  Eyes:  Pupils reactive to light  Neck: Neck supple. No thyromegaly present.  Cardiovascular:  Irreg Irreg, no Murmur  Pulmonary/Chest: Effort normal and breath sounds normal.  Abdominal: Bowel sounds are normal. She exhibits no distension.  Neurological: She is alert.  Patient is appropriate to basic conversation during exam. She followed simple commands. She was able to provide her name and age. Noted left gaze preference is persistent.Alert.Oriented to self only,Receptive language deficits noted  Dense right homonymous hemianopsia with poor compensation  Motor strength 4+/5 in the right deltoid, biceps, triceps, grip,3-/5 hip flexor knee extensor ankle dorsiflexor plantar flexor  5/5 on the left side. Needs cues to engage the right side.  Sensory absent LT RUE and RLE, intact on left  Mood/Affect: Labile crying R thigh and calf without tenderness to palpation R hip pain with int/ext rotation  Assessment/Plan: 1. Functional deficits secondary to L PCA infarct which require 3+ hours per day of interdisciplinary therapy in a comprehensive inpatient rehab setting. Physiatrist is providing close team supervision and 24 hour management of active medical problems listed below. Physiatrist and rehab team continue to assess barriers to discharge/monitor patient progress toward functional and medical goals. FIM: FIM - Bathing Bathing Steps Patient Completed: Chest;Right Arm;Left Arm;Abdomen;Left upper leg;Front perineal area;Left  lower leg (including foot);Right upper leg Bathing: 4: Min-Patient completes 8-9 24f 10 parts or 75+ percent (performed in sit and stand)  FIM - Upper Body Dressing/Undressing Upper body dressing/undressing steps patient completed: Thread/unthread right sleeve of front closure shirt/dress;Thread/unthread left sleeve of front closure shirt/dress;Pull shirt around back of front closure shirt/dress Upper body dressing/undressing: 4: Min-Patient completed 75 plus % of tasks FIM - Lower Body Dressing/Undressing Lower body dressing/undressing steps patient completed: Pull pants up/down;Don/Doff left shoe;Fasten/unfasten left shoe;Thread/unthread left pants leg;Thread/unthread right pants leg (pulled pants up in standing with supervision) Lower body dressing/undressing: 3: Mod-Patient completed 50-74% of tasks (wearing brief and performed in sit and stand)  FIM - Toileting Toileting steps completed by patient: Performs perineal hygiene Toileting: 2: Max-Patient completed 1 of 3 steps  FIM - Diplomatic Services operational officer Devices: Grab bars;Walker Toilet Transfers: 4-To toilet/BSC: Min A (steadying Pt. > 75%);3-From toilet/BSC: Mod A (lift or lower assist)  FIM - Bed/Chair Transfer Bed/Chair Transfer Assistive Devices: Arm rests;HOB elevated;Bed rails Bed/Chair Transfer: 0: Activity did not occur  FIM - Locomotion: Wheelchair Distance: 80 Locomotion: Wheelchair: 2: Travels 50 - 149 ft with minimal assistance (Pt.>75%) FIM - Locomotion: Ambulation Locomotion: Ambulation Assistive Devices: Designer, industrial/product Ambulation/Gait Assistance: 3: Mod assist Locomotion: Ambulation: 1: Travels less than 50 ft with moderate assistance (Pt: 50 - 74%)  Comprehension Comprehension Mode: Auditory Comprehension: 4-Understands basic 75 - 89% of the time/requires cueing 10 - 24% of the time  Expression Expression Mode: Verbal Expression: 3-Expresses basic 50 - 74% of the time/requires cueing 25 -  50% of the time. Needs to repeat parts of sentences.  Social Interaction Social Interaction: 4-Interacts appropriately 75 - 89% of the time - Needs redirection for appropriate language or to initiate interaction.  Problem Solving Problem Solving Mode: Not assessed Problem Solving: 3-Solves basic 50 - 74% of the time/requires cueing 25 - 49% of the time  Memory Memory: 3-Recognizes or recalls 50 - 74% of the time/requires cueing 25 - 49% of the time  Medical Problem List and Plan:  1. Embolic large left PCA infarct  2. DVT Prophylaxis/Anticoagulation: Xarelto. Monitor CBC  3. Neuropsych: This patient Is not capable of making decisions on his/her own behalf.  4. New onset atrial fibrillation. Continue Cardizem and Toprol as advised.may need to adjust dose  5. Diabetes mellitus with peripheral neuropathy. Hemoglobin A1c 6.1. SSI. gluophage on hold for elevated Creat. Recheck BMET Fair control at  present.  6. Hypertension. Patient on Lotrel 10-20 daily prior to admission as well as hydrochlorothiazide 25 mg daily. Am systolic elevation start hs cardura - pt is on toprol and cardizem for rate control with BP borderline  -increased cardizem to 360mg -BP and HR improved, am values high, check orthostatics 7.  R hip pain severe OA on Xray 10/2012, increase tramadol 8.  Dysphagia, will ask SLP to reassess LOS (Days) 18 A FACE TO FACE EVALUATION WAS PERFORMED  [name] 12/12/2012, 7:29 AM

## 2012-12-12 NOTE — Progress Notes (Signed)
Physical Therapy Note  Patient Details  Name: [name] MRN: [id number] Date of Birth: Dec 22, 1945 Today's Date: 12/12/2012  8:50 - 9:30 40 minutes Individual session Patient denies pain.  Patient sitting in bed upon entering room. Therapist donned ted hose and shoes due to time. Patient supine to sit edge of bed with supervision. Patient sit to stand with min steady assist. Patient ambulated 15 feet to bathroom with rolling walker and moderate assistance to help steer walker due to visual deficits. Patient required assist with brief before and after toileting. Patient performed peri hygiene. Patient required mod assist to stand from toilet. Patient ambulated 10 feet with rolling walker back to wheelchair. Patient assisted with donning pants and shirt due to time. Patient set up at sink to perform grooming tasks. Patient required set up due to vision. Patient washed hands, brushed teeth and combed hair. Patient left in wheelchair with all items in reach.   Arelia Longest M 12/12/2012, 12:46 PM

## 2012-12-13 ENCOUNTER — Inpatient Hospital Stay (HOSPITAL_COMMUNITY): Payer: PRIVATE HEALTH INSURANCE | Admitting: Physical Therapy

## 2012-12-13 ENCOUNTER — Inpatient Hospital Stay (HOSPITAL_COMMUNITY): Payer: PRIVATE HEALTH INSURANCE

## 2012-12-13 ENCOUNTER — Inpatient Hospital Stay (HOSPITAL_COMMUNITY): Payer: PRIVATE HEALTH INSURANCE | Admitting: Occupational Therapy

## 2012-12-13 ENCOUNTER — Inpatient Hospital Stay (HOSPITAL_COMMUNITY): Payer: PRIVATE HEALTH INSURANCE | Admitting: Speech Pathology

## 2012-12-13 DIAGNOSIS — I6340 Cerebral infarction due to embolism of unspecified cerebral artery: Secondary | ICD-10-CM

## 2012-12-13 DIAGNOSIS — G8110 Spastic hemiplegia affecting unspecified side: Secondary | ICD-10-CM

## 2012-12-13 DIAGNOSIS — I69921 Dysphasia following unspecified cerebrovascular disease: Secondary | ICD-10-CM

## 2012-12-13 LAB — GLUCOSE, CAPILLARY
Glucose-Capillary: 167 mg/dL — ABNORMAL HIGH (ref 70–99)
Glucose-Capillary: 179 mg/dL — ABNORMAL HIGH (ref 70–99)
Glucose-Capillary: 126 mg/dL — ABNORMAL HIGH (ref 70–99)

## 2012-12-13 MED ORDER — LORATADINE 10 MG PO TABS
10.0000 mg | ORAL_TABLET | Freq: Every day | ORAL | Status: DC
  Administered 2012-12-13 – 2012-12-14 (×2): 10 mg via ORAL
  Filled 2012-12-13 (×3): qty 1

## 2012-12-13 NOTE — Progress Notes (Signed)
Occupational Therapy Session Note  Patient Details  Name: [name] MRN: [id number] Date of Birth: May 16, 1946  Today's Date: 12/13/2012 Time: 7829-5621 Time Calculation (min): 60 min  Short Term Goals: Week 3:  OT Short Term Goal 1 (Week 3): Unmet LTGs of overall Mod Assist for BADLs and Min assist for toilet transfers  Skilled Therapeutic Interventions/Progress Updates:  Self care retraining to include BSC transfers, toileting, sponge bath and dressing.  Focused session on activity tolerance, sit><stands, standing tolerance and standing balance, visual scanning and attention to right, adaptive techiniques to use her hands to touch and feel so she can be safe in her environment and the use of a stool to place her feet on for LB bath and dressing.  Patient with increased pain in RLE this session and had difficulty with sit><stands and standing tolerance for LB bath and dressing which is further complicated by her body type and obesity.  Patient with significant itching since admission which also affects her ability to fully participate secondary to she constantly stops any BADL task she is doing to scratch.  RN aware.  Reviewed patient's planned discharge date and she state, "well no one has told me".  We reviewed discharge day & date 4 times during session and by the end of the session she could verbalize "Friday".  Reviewed the need for caregiver education to take place before discharge home.  Caregiver(s) need to come in at least 2-3 days to be certain the are prepared to assist patient upon discharge.  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, aphasia, apraxia, visual deficits:diplopia, probable right field cut, decreased cognition Restrictions Weight Bearing Restrictions: No Other Position/Activity Restrictions: Per sister, pt had a bad right hip before this and was to have surgery, this is really  painful to her now Pain: RLE pain with mobility and significant itching from mid torso down to LEs, RN notified medication provided ADL: See FIM for current functional status  Therapy/Group: Individual Therapy  Chyna Kneece 12/13/2012, 10:19 AM

## 2012-12-13 NOTE — Progress Notes (Signed)
Speech Language Pathology Daily Session Note  Patient Details  Name: [name] MRN: [id number] Date of Birth: Dec 20, 1945  Today's Date: 12/13/2012 Time: 1005-1030 Time Calculation (min): 25 min  Short Term Goals: Week 3: SLP Short Term Goal 1 (Week 3): Patient will return use of call bell to request help with min assist verbal and visual cues SLP Short Term Goal 2 (Week 3): Patient will label common objects with mod assist semantic cues SLP Short Term Goal 3 (Week 3): Patient will follow 1-step commands with supervision assist verbal cues SLP Short Term Goal 4 (Week 3): Patient will increase awarenss of perseverative errors with mod assist verbal and visual cues SLP Short Term Goal 5 (Week 3): Patient will consume thin liquids with via cup with no overt s/s of aspiration.  Skilled Therapeutic Interventions: Skilled treatment session focused on addressing language and dysphagia goals.  Upon entering room patient was exhibiting intermittent throat clears.  SLP facilitated session with thin via cup to reduce suspected tickle in throat; however, throat clears continued intermittently throughout session.  SLP also facilitated session with mod assist verbal and cues to switch between automatic verbal sequences without perseveration.  Patient's most limiting factor continues to be her decreased awareness of verbal output.  SLP spoke to RN and PA regarding suspected sinus drainage impacting intermittent throat clears; PA recommended initiation of Claritin.  FIM:  Comprehension Comprehension Mode: Auditory Comprehension: 4-Understands basic 75 - 89% of the time/requires cueing 10 - 24% of the time Expression Expression Mode: Verbal Expression: 3-Expresses basic 50 - 74% of the time/requires cueing 25 - 50% of the time. Needs to repeat parts of sentences. Social Interaction Social Interaction: 4-Interacts appropriately 75 - 89% of the time - Needs redirection for appropriate language or to initiate  interaction. Problem Solving Problem Solving: 3-Solves basic 50 - 74% of the time/requires cueing 25 - 49% of the time Memory Memory: 3-Recognizes or recalls 50 - 74% of the time/requires cueing 25 - 49% of the time FIM - Eating Eating Activity: 5: Set-up assist for cut food;5: Set-up assist for open containers;5: Needs verbal cues/supervision  Pain Pain Assessment Pain Assessment: No/denies pain  Therapy/Group: Individual Therapy  Charlane Ferretti., CCC-SLP [phone]  Leisha Trinkle 12/13/2012, 5:14 PM

## 2012-12-13 NOTE — Progress Notes (Signed)
Physical Therapy Note  Patient Details  Name: [name] MRN: [id number] Date of Birth: Apr 30, 1946 Today's Date: 12/13/2012  1030-1110 (40 minutes) individual Pain: no reported pain Focus of treatment: wc mobility; transfer training; bilateral LE strengthening Treatment: Pt up in wc; transfers wc to mat with RW min assist for safety only; mat to wc min assist sit to stand RW ; wc mobility 100 feet SBA with increased time (pt able to change directions with vcs only); sit to stand 2 X 3 from wc SBA using bilateral UE support.    Nataya Bastedo,JIM 12/13/2012, 11:16 AM

## 2012-12-13 NOTE — Progress Notes (Signed)
Patient sleeping quietly without signs of discomfort or distress.  Received Benadryl earlier in shift for complaint of "itchy eyes".   [name]

## 2012-12-13 NOTE — Progress Notes (Signed)
Social Work Patient ID: [name], female   DOB: 1945-09-26, 67 y.o.   MRN: [id number] Spoke with daughter via telephone to discuss if discharge plan is changed.  She reports the plan still is NHP. Asked her to talk with pt when visits today.  Have also spoke with pt to inform plan is short term NHP and there is no caregiver to provide 24 hour care at discharge.  Pt agreeable at this time, but she may forget, since she has the other times discussed Discharge date.  Daughter has visited and has chosen [facility] as her first choice.  Will send out FL2 and see if bed available. Pt did well today in therapies, making good progress.

## 2012-12-13 NOTE — Progress Notes (Signed)
Patient ID: [name], female   DOB: 29-Aug-1945, 67 y.o.   MRN: [id number] Subjective/Complaints: Not aware of upcoming d/c date, then smiled  Review of Systems  Unable to perform ROS: medical condition   Objective: Vital Signs: Blood pressure 118/70, pulse 73, temperature 97.3 F (36.3 C), temperature source Oral, resp. rate 19, height 5\' 3"  (1.6 m), weight 105.87 kg (233 lb 6.4 oz), SpO2 96.00%. No results found. Results for orders placed during the hospital encounter of 11/24/12 (from the past 72 hour(s))  GLUCOSE, CAPILLARY     Status: Abnormal   Collection Time    12/10/12  7:17 AM      Result Value Range   Glucose-Capillary 142 (*) 70 - 99 mg/dL  GLUCOSE, CAPILLARY     Status: Abnormal   Collection Time    12/10/12 11:16 AM      Result Value Range   Glucose-Capillary 180 (*) 70 - 99 mg/dL  GLUCOSE, CAPILLARY     Status: Abnormal   Collection Time    12/10/12  4:30 PM      Result Value Range   Glucose-Capillary 160 (*) 70 - 99 mg/dL   Comment 1 Notify RN    GLUCOSE, CAPILLARY     Status: Abnormal   Collection Time    12/10/12  9:40 PM      Result Value Range   Glucose-Capillary 164 (*) 70 - 99 mg/dL   Comment 1 Notify RN    GLUCOSE, CAPILLARY     Status: Abnormal   Collection Time    12/11/12  7:20 AM      Result Value Range   Glucose-Capillary 134 (*) 70 - 99 mg/dL   Comment 1 Notify RN    GLUCOSE, CAPILLARY     Status: Abnormal   Collection Time    12/11/12 11:28 AM      Result Value Range   Glucose-Capillary 195 (*) 70 - 99 mg/dL   Comment 1 Notify RN    GLUCOSE, CAPILLARY     Status: Abnormal   Collection Time    12/11/12  4:25 PM      Result Value Range   Glucose-Capillary 117 (*) 70 - 99 mg/dL   Comment 1 Notify RN    GLUCOSE, CAPILLARY     Status: Abnormal   Collection Time    12/11/12  8:45 PM      Result Value Range   Glucose-Capillary 155 (*) 70 - 99 mg/dL   Comment 1 Notify RN    GLUCOSE, CAPILLARY     Status: Abnormal   Collection Time     12/12/12  7:28 AM      Result Value Range   Glucose-Capillary 121 (*) 70 - 99 mg/dL   Comment 1 Notify RN    GLUCOSE, CAPILLARY     Status: Abnormal   Collection Time    12/12/12 11:25 AM      Result Value Range   Glucose-Capillary 175 (*) 70 - 99 mg/dL   Comment 1 Notify RN    GLUCOSE, CAPILLARY     Status: Abnormal   Collection Time    12/12/12  4:40 PM      Result Value Range   Glucose-Capillary 133 (*) 70 - 99 mg/dL   Comment 1 Notify RN    GLUCOSE, CAPILLARY     Status: Abnormal   Collection Time    12/12/12  8:30 PM      Result Value Range   Glucose-Capillary 151 (*) 70 -  99 mg/dL   Comment 1 Notify RN        Vitals reviewed.  Gen: NAD  Eyes:  Pupils reactive to light  Neck: Neck supple. No thyromegaly present.  Cardiovascular:  Irreg Irreg, no Murmur  Pulmonary/Chest: Effort normal and breath sounds normal.  Abdominal: Bowel sounds are normal. She exhibits no distension.  Neurological: She is alert.  Patient is appropriate to basic conversation during exam. She followed simple commands. She was able to provide her name and age. Noted left gaze preference is persistent.Alert.Oriented to self only,Receptive language deficits noted  Dense right homonymous hemianopsia with poor compensation  Motor strength 4+/5 in the right deltoid, biceps, triceps, grip,3-/5 hip flexor knee extensor ankle dorsiflexor plantar flexor  5/5 on the left side. Needs cues to engage the right side.  Sensory absent LT RUE and RLE, intact on left  Mood/Affect: Labile crying R thigh and calf without tenderness to palpation R hip pain with int/ext rotation as well as with flex  Assessment/Plan: 1. Functional deficits secondary to L PCA infarct which require 3+ hours per day of interdisciplinary therapy in a comprehensive inpatient rehab setting. Physiatrist is providing close team supervision and 24 hour management of active medical problems listed below. Physiatrist and rehab team continue to  assess barriers to discharge/monitor patient progress toward functional and medical goals. FIM: FIM - Bathing Bathing Steps Patient Completed: Chest;Right Arm;Left Arm;Abdomen;Left upper leg;Front perineal area;Left lower leg (including foot);Right upper leg Bathing: 4: Min-Patient completes 8-9 43f 10 parts or 75+ percent (performed in sit and stand)  FIM - Upper Body Dressing/Undressing Upper body dressing/undressing steps patient completed: Thread/unthread right sleeve of front closure shirt/dress;Thread/unthread left sleeve of front closure shirt/dress;Pull shirt around back of front closure shirt/dress Upper body dressing/undressing: 4: Min-Patient completed 75 plus % of tasks FIM - Lower Body Dressing/Undressing Lower body dressing/undressing steps patient completed: Pull pants up/down;Don/Doff left shoe;Fasten/unfasten left shoe;Thread/unthread left pants leg;Thread/unthread right pants leg (pulled pants up in standing with supervision) Lower body dressing/undressing: 3: Mod-Patient completed 50-74% of tasks (wearing brief and performed in sit and stand)  FIM - Toileting Toileting steps completed by patient: Performs perineal hygiene Toileting: 2: Max-Patient completed 1 of 3 steps  FIM - Archivist Transfers Assistive Devices: Art gallery manager Transfers: 3-From toilet/BSC: Mod A (lift or lower assist)  FIM - Banker Devices: HOB elevated;Bed rails Bed/Chair Transfer: 5: Supine > Sit: Supervision (verbal cues/safety issues);4: Bed > Chair or W/C: Min A (steadying Pt. > 75%)  FIM - Locomotion: Wheelchair Distance: 80 Locomotion: Wheelchair: 2: Travels 50 - 149 ft with minimal assistance (Pt.>75%) FIM - Locomotion: Ambulation Locomotion: Ambulation Assistive Devices: Designer, industrial/product Ambulation/Gait Assistance: 3: Mod assist Locomotion: Ambulation: 1: Travels less than 50 ft with moderate assistance (Pt: 50 -  74%)  Comprehension Comprehension Mode: Auditory Comprehension: 4-Understands basic 75 - 89% of the time/requires cueing 10 - 24% of the time  Expression Expression Mode: Verbal Expression: 3-Expresses basic 50 - 74% of the time/requires cueing 25 - 50% of the time. Needs to repeat parts of sentences.  Social Interaction Social Interaction: 4-Interacts appropriately 75 - 89% of the time - Needs redirection for appropriate language or to initiate interaction.  Problem Solving Problem Solving Mode: Not assessed Problem Solving: 3-Solves basic 50 - 74% of the time/requires cueing 25 - 49% of the time  Memory Memory: 3-Recognizes or recalls 50 - 74% of the time/requires cueing 25 - 49% of the time  Medical Problem  List and Plan:  1. Embolic large left PCA infarct  2. DVT Prophylaxis/Anticoagulation: Xarelto. Monitor CBC  3. Neuropsych: This patient Is not capable of making decisions on his/her own behalf.  4. New onset atrial fibrillation. Continue Cardizem and Toprol as advised.may need to adjust dose  5. Diabetes mellitus with peripheral neuropathy. Hemoglobin A1c 6.1. SSI. gluophage on hold for elevated Creat. Recheck BMET Fair control at present.  6. Hypertension. Patient on Lotrel 10-20 daily prior to admission as well as hydrochlorothiazide 25 mg daily. Am systolic elevation start hs cardura, improved, monitor orthstasis - pt is on toprol and cardizem for rate control with BP borderline  -increased cardizem to 360mg -BP and HR improved, am values high, check orthostatics 7.  R hip pain severe OA on Xray 10/2012, increase tramadol 8.  Dysphagia, will ask SLP to reassess LOS (Days) 19 A FACE TO FACE EVALUATION WAS PERFORMED  Shanine Kreiger E 12/13/2012, 7:04 AM

## 2012-12-13 NOTE — Progress Notes (Signed)
Physical Therapy Session Note  Patient Details  Name: [name] MRN: [id number] Date of Birth: August 03, 1946  Today's Date: 12/13/2012 Time: 1478-2956 Time Calculation (min): 45 min    Skilled Therapeutic Interventions/Progress Updates:  Treatment today focused on neuromuscular re-education RUE and RLE via tactile and VCs for w/c propulsion and parts manipulation using bil UEs, sit>< stand with appropriate foot placement and hip flexion, gait, RLE stance stability and swing components.  Pt was able to comprehend simple commands for seated motor control activities of knee extension with 4# wt at ankle, and resisted bil hip adduction and abduction.  Pt was able to perform gait with RW on straight pathway with min assist, however, when turning to sit in w/c, L knee buckled and pt had uncontrolled descent into w/c requiring max assist to prevent fall.  Vision for functional mobility in w/c is improving, but pt still has problems with steering and staying on R side of hall and avoiding obstacles on R.    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, aphasia, apraxia, visual deficits:diplopia, probable right field cut, decreased cognition Restrictions Weight Bearing Restrictions: No Other Position/Activity Restrictions: Per sister, pt had a bad right hip before this and was to have surgery, this is really painful to her now Pain: Pain Assessment Pain Assessment: 0-10 Pain Score:  Unable to score, "not too bad" stating her R hip hurt, but pointing to her R knee Pain Location: Hip Pain Orientation: Right Pain Onset: On-going Patients Stated Pain Goal: 4 Pain Intervention(s): Medication (See eMAR)   Locomotion : Ambulation Ambulation/Gait Assistance: 4: Min assist Wheelchair Mobility Distance: 100  Other Treatments: Treatments Neuromuscular Facilitation: Right;Upper Extremity;Lower Extremity;Forced  use;Activity to increase coordination;Activity to increase timing and sequencing;Activity to increase motor control;Activity to increase sustained activation;Activity to increase lateral weight shifting;Activity to increase anterior-posterior weight shifting  See FIM for current functional status  Therapy/Group: Individual Therapy  [name] 12/13/2012, 3:03 PM

## 2012-12-14 ENCOUNTER — Inpatient Hospital Stay (HOSPITAL_COMMUNITY): Payer: PRIVATE HEALTH INSURANCE | Admitting: Occupational Therapy

## 2012-12-14 ENCOUNTER — Inpatient Hospital Stay (HOSPITAL_COMMUNITY): Payer: PRIVATE HEALTH INSURANCE | Admitting: Speech Pathology

## 2012-12-14 ENCOUNTER — Inpatient Hospital Stay (HOSPITAL_COMMUNITY): Payer: PRIVATE HEALTH INSURANCE

## 2012-12-14 LAB — GLUCOSE, CAPILLARY: Glucose-Capillary: 173 mg/dL — ABNORMAL HIGH (ref 70–99)

## 2012-12-14 MED ORDER — POLYETHYLENE GLYCOL 3350 17 G PO PACK
17.0000 g | PACK | Freq: Two times a day (BID) | ORAL | Status: DC
  Administered 2012-12-14: 17 g via ORAL
  Filled 2012-12-14 (×4): qty 1

## 2012-12-14 MED ORDER — PRAMOXINE HCL 1 % RE FOAM
Freq: Three times a day (TID) | RECTAL | Status: DC
  Filled 2012-12-14: qty 15

## 2012-12-14 MED ORDER — CETIRIZINE HCL 10 MG PO TABS
10.0000 mg | ORAL_TABLET | Freq: Every day | ORAL | Status: DC
  Administered 2012-12-14: 10 mg via ORAL
  Filled 2012-12-14 (×2): qty 1

## 2012-12-14 MED ORDER — CAMPHOR-MENTHOL 0.5-0.5 % EX LOTN
TOPICAL_LOTION | CUTANEOUS | Status: DC | PRN
  Administered 2012-12-14 – 2012-12-15 (×2): via TOPICAL
  Filled 2012-12-14: qty 222

## 2012-12-14 MED ORDER — HYDROCORTISONE ACE-PRAMOXINE 1-1 % RE FOAM
1.0000 | Freq: Two times a day (BID) | RECTAL | Status: DC
  Administered 2012-12-14 (×2): 1 via RECTAL
  Filled 2012-12-14: qty 10

## 2012-12-14 MED ORDER — NON FORMULARY: 10.0000 mg | Freq: Every day | Status: DC

## 2012-12-14 NOTE — Progress Notes (Signed)
Physical Therapy Weekly Progress Note  Patient Details  Name: [name] MRN: [id number] Date of Birth: 07/29/1946  Today's Date: 12/14/2012 Time: 1115-1200 Time Calculation (min): 45 min  Patient has met 4 of 4 short term goals.  Patient has made gains in all areas. Depending on pain, patient has demonstrated supine to sit with supervision, sit to stand with supervision, and ambulation 28 feet with rolling walker and min assist mainly to help guide walker due to poor vision. Patient propelled wheelchair 85 feet with min assist to avoid objects on right in controlled environment. Patient continues to require assist with right LE for sit to supine mainly due to pain.  Patient continues to demonstrate the following deficits: apraxia, right visual field cut, pain in right hip and knee, right hemiparesis, dependency with functional mobility and therefore will continue to benefit from skilled PT intervention to enhance overall performance with activity tolerance, balance and functional use of  right lower extremity.  Patient progressing toward long term goals..  Continue plan of care. Patient's discharge plan has changed to SNF. Await bed offer.  PT Short Term Goals Week 3:  PT Short Term Goal 1 (Week 3): patient will move supine to sit with supervision PT Short Term Goal 1 - Progress (Week 3): Met PT Short Term Goal 2 (Week 3): Patient will transfer bed to and from wheelchair with min assist PT Short Term Goal 2 - Progress (Week 3): Met PT Short Term Goal 3 (Week 3): Patient will move sit to stand with supervision from wheelchair PT Short Term Goal 3 - Progress (Week 3): Met PT Short Term Goal 4 (Week 3): Patient will ambulate 20 feet with rolling walker and mod assist PT Short Term Goal 4 - Progress (Week 3): Met Week 4:  PT Short Term Goal 1 (Week 4): Patient will move sit to supine with min assist PT Short Term Goal 2 (Week 4): Patient will perform car transfer with mod assist PT Short Term  Goal 3 (Week 4): Patient will ambulate with rolling walker 30 feet with min assist  Skilled Therapeutic Interventions/Progress Updates:      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, aphasia, apraxia, visual deficits:diplopia, probable right field cut, decreased cognition Restrictions Weight Bearing Restrictions: No Other Position/Activity Restrictions: Per sister, pt had a bad right hip before this and was to have surgery, this is really painful to her now Pain: Pain Assessment Pain Assessment: No/denies pain Pain Score:   4 Pain Location: Knee Pain Orientation: Right;Left Pain Descriptors: Sore Pain Frequency: Intermittent Pain Onset: Gradual Patients Stated Pain Goal: 2 Pain Intervention(s): Medication (See eMAR) Multiple Pain Sites: No Locomotion : Ambulation Ambulation/Gait Assistance: 4: Min assist Wheelchair Mobility Distance: 85   See FIM for current functional status  Therapy/Group: Individual Therapy  Arelia Longest M 12/14/2012, 12:17 PM

## 2012-12-14 NOTE — Discharge Summary (Signed)
[name], [name]                ACCOUNT NO.:  [id number]  MEDICAL RECORD NO.:  [id number]  LOCATION:  4009                         FACILITY:  [facility]  PHYSICIAN:  Mariam Dollar, P.A.  DATE OF BIRTH:  [birth year]  DATE OF ADMISSION:  11/24/2012 DATE OF DISCHARGE:  12/15/2012                              DISCHARGE SUMMARY   DISCHARGE DIAGNOSES: 1. Embolic large left PCA infarction. 2. New-onset atrial fibrillation. 3. Diabetes mellitus with peripheral neuropathy, hypertension,     depression, right hip osteoarthritis.  HISTORY OF PRESENT ILLNESS:  This is a 67 year old right-handed female admitted November 18, 2012, with headache, elevated blood pressure 172/127. EKG showed atrial fibrillation with RVR.  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  Echocardiogram with ejection fraction 65%.  No wall motion abnormalities.  Carotid Dopplers with no ICA stenosis.  The patient did not receive tPA.  Started on Cardizem infusion for atrial fibrillation and transition to Cardizem CD 360 mg daily.  CT angiogram of the head showed proximal left posterior cerebral artery occlusion.  Neurology Service was consulted, advised aspirin therapy as well as Xarelto. Physical and Occupational therapy evaluations completed and ongoing recommendations of Physical Medicine Rehab consult and the patient was admitted for comprehensive rehab program.  PAST MEDICAL HISTORY:  See discharge diagnoses.  SOCIAL HISTORY:  Lives with daughter with intermittent assistance. Functional history prior to admission, she was driving, used a cane hand her walker due to hip pain.  Functional status upon admission to rehab services was +2 total assist for stand pivot transfers, +2 total assist for sit to supine.  PHYSICAL EXAMINATION:  VITAL SIGNS:  Blood pressure 183/109 pulse 91, temperature 97.9, respirations 20. GENERAL:  This was an alert female,  appropriate at the basic conversation during exam.  She followed simple commands.  She was able to provide her name and age. EYES:  Noted left gaze preference.  Pupils round and reactive to light. LUNGS:  Clear to auscultation. CARDIAC:  Rate controlled. ABDOMEN:  Obese, soft, nontender.  REHABILITATION HOSPITAL COURSE:  The patient was admitted to inpatient rehab services with therapies initiated on a 3-hour daily basis consisting of physical therapy, occupational therapy, speech therapy, and 24-hour rehabilitation nursing.  The following issues were addressed during the patient's rehabilitation stay.  Pertaining the patient's embolic left PCA infarct remained stable followed by Neurology Services maintained on both aspirin and Xarelto.  Noted new onset of atrial fibrillation.  No chest pain or shortness of breath as she remained on Cardizem as well as Toprol 100 mg daily.  She did have a noted history of diabetes mellitus, hemoglobin A1c of 6.1.  Her Glucophage had been on hold due to some mild elevations in creatinine 1.62.  Latest blood sugars 126, 146, 201.  She did have a history of hypertension.  As noted, she remained on Toprol as well as Cardizem with Cardura.  Blood pressure is under acceptable readings.  She had a long history of right hip pain secondary to osteoarthritis.  Latest x-rays March, 2014, she was using tramadol as needed.  The patient received weekly collaborative interdisciplinary team conferences  to discuss estimated length of stay, family teaching, and any barriers to her discharge.  She had occasional incontinence of bladder.  Routine toileting urinalysis study negative. She is tolerating a regular consistency diet.  She is max total assist for basic activities of daily living and functional mobility that occasionally required +2 for bathing and dressing.  She was moderate assist for basic stand pivot transfers minimal assist.  Wheelchair mobility 50 feet,  ambulating 10 feet with a rolling walker and moderate assist with limited endurance and strength.  Speech therapy continued to work with some issues in regards to focusing on addressing language and intermittent throat clearing of what she was able to tolerate a regular consistency diet showing no signs of aspiration.  Due to limited support at home, it was felt skilled nursing facility was needed.  Bed becoming available Dec 15, 2012, and discharge taking place at that time.  DISCHARGE MEDICATIONS:  Aspirin 81 mg p.o. daily, Zyrtec 10 mg p.o. daily, Celexa 10 mg p.o. daily, Cardizem CD 360 mg p.o. daily, Cardura 1 mg p.o. at bedtime, Toprol-XL 100 mg p.o. daily, MiraLAX 17 g daily with 8 ounces of water hold for loose stools.  Xarelto 50 mg p.o. daily, Ultram 100 mg every 8 hours as needed pain.  DIET:  1800-calorie ADA.  SPECIAL INSTRUCTIONS:  Check blood sugars a.c. and h.s.  the patient's Glucophage remained on hold secondary to mild elevations in creatinine; latest level of 1.62.  The patient to follow up Dr. Claudette Laws at the outpatient rehab center as directed.  Dr. Maurice Small, whos is patient's medical management physician, Dr. Delia Heady, Neurology Services, call for appointment 1 month.     Mariam Dollar, P.A.     DA/MEDQ  D:  12/14/2012  T:  12/14/2012  Job:  [id number]  cc:   Erick Colace, M.D. Pramod P. Pearlean Brownie, MD Gretta Arab Valentina Lucks, M.D.

## 2012-12-14 NOTE — Progress Notes (Signed)
Speech Language Pathology Daily Session Note & Discharge Summary  Patient Details  Name: [name] MRN: [id number] Date of Birth: Jul 16, 1946  Today's Date: 12/14/2012 Time: 7829-5621 Time Calculation (min): 30 min  Short Term Goals: Week 3: SLP Short Term Goal 1 (Week 3): Patient will return use of call bell to request help with min assist verbal and visual cues SLP Short Term Goal 2 (Week 3): Patient will label common objects with mod assist semantic cues SLP Short Term Goal 3 (Week 3): Patient will follow 1-step commands with supervision assist verbal cues SLP Short Term Goal 4 (Week 3): Patient will increase awarenss of perseverative errors with mod assist verbal and visual cues SLP Short Term Goal 5 (Week 3): Patient will consume thin liquids with via cup with no overt s/s of aspiration.  Skilled Therapeutic Interventions: Skilled treatment session focused on addressing language goals.  SLP facilitated session with increased wait time for patient to verbalized basic wants and needs and cue x1 to follow 1-step basic commands.  SLP also facilitated session with max assist cues to follow 2-step basic commands and to self-monitor and correct verbal errors most of which were perseverative in nature.  Patient spoke with daughter via phone during session and speech was more fluent and vague in nature; however, functional.       FIM:  Comprehension Comprehension: 4-Understands basic 75 - 89% of the time/requires cueing 10 - 24% of the time Expression Expression: 3-Expresses basic 50 - 74% of the time/requires cueing 25 - 50% of the time. Needs to repeat parts of sentences. Social Interaction Social Interaction: 4-Interacts appropriately 75 - 89% of the time - Needs redirection for appropriate language or to initiate interaction. Problem Solving Problem Solving: 3-Solves basic 50 - 74% of the time/requires cueing 25 - 49% of the time Memory Memory: 3-Recognizes or recalls 50 - 74% of the  time/requires cueing 25 - 49% of the time FIM - Eating Eating Activity: 5: Set-up assist for cut food;5: Set-up assist for open containers;5: Needs verbal cues/supervision  Pain Pain Assessment Pain Assessment: No/denies pain Pain Score: 0-No pain  Therapy/Group: Individual Therapy   Speech Language Pathology Discharge Summary  Patient Details  Name: [name] MRN: [id number] Date of Birth: January 26, 1946  Today's Date: 12/14/2012  Patient has met 2 of 5 long term goals.  Patient to discharge at overall Supervision;Mod level.  Reasons goals not met: severity of visual-perceptual, self-monitoring deficits.   Clinical Impression/Discharge Summary: Patient met 2 out of 5 long term goals this reporting period due to gains in spontaneous verbal expression and ability to receptively follow basic information.  Patient has break down with 2-step directions and verbal expression that puts any type of demand on her.  Her continued aphasia is characterized as fluent errors with no self-monitoring or correcting.  Patient's ability to care for self and solve basic problems is greatly impacted by her visual-perceptual deficits which also impacts types of cues clinician can utilize during therapy.  As a result, it is recommended that this patient continue to receive skilled SLP services at next level of care.  Care Partner:  Caregiver Able to Provide Assistance: No  Type of Caregiver Assistance: Cognitive;Physical  Recommendation:  24 hour supervision/assistance;Skilled Nursing facility  Rationale for SLP Follow Up: Maximize functional communication;Maximize cognitive function and independence;Reduce caregiver burden   Equipment: none   Reasons for discharge: Discharged from hospital   Patient/Family Agrees with Progress Made and Goals Achieved: Yes   See FIM for current  functional status  Charlane Ferretti., CCC-SLP [phone]  [name] 12/14/2012, 4:01 PM

## 2012-12-14 NOTE — Progress Notes (Signed)
Social Work Patient ID: [name], female   DOB: 05-30-1946, 67 y.o.   MRN: [id number]  Received SNF offer today from [facility] who could admit pt tomorrow.  Contacted daughter who is accepting bed and very pleased with private room.  Alerted pt who is reluctanctly agreeable stating, "I don't want to go but I know I have to."  Provided support for plan to go SNF and continue therapies.  Will plan for d/c in a.m.  MD and tx team aware.  Raliegh Scobie, LCSW

## 2012-12-14 NOTE — Progress Notes (Signed)
Patient ID: [name], female   DOB: 1945/12/07, 67 y.o.   MRN: [id number] Subjective/Complaints: Aphasic, denies knowing D/C plan to SNF, despite SW talking to her about it yesterday Review of Systems  Unable to perform ROS: medical condition   Objective: Vital Signs: Blood pressure 130/92, pulse 69, temperature 98.5 F (36.9 C), temperature source Oral, resp. rate 18, height 5\' 3"  (1.6 m), weight 105.87 kg (233 lb 6.4 oz), SpO2 92.00%. No results found. Results for orders placed during the hospital encounter of 11/24/12 (from the past 72 hour(s))  GLUCOSE, CAPILLARY     Status: Abnormal   Collection Time    12/11/12  7:20 AM      Result Value Range   Glucose-Capillary 134 (*) 70 - 99 mg/dL   Comment 1 Notify RN    GLUCOSE, CAPILLARY     Status: Abnormal   Collection Time    12/11/12 11:28 AM      Result Value Range   Glucose-Capillary 195 (*) 70 - 99 mg/dL   Comment 1 Notify RN    GLUCOSE, CAPILLARY     Status: Abnormal   Collection Time    12/11/12  4:25 PM      Result Value Range   Glucose-Capillary 117 (*) 70 - 99 mg/dL   Comment 1 Notify RN    GLUCOSE, CAPILLARY     Status: Abnormal   Collection Time    12/11/12  8:45 PM      Result Value Range   Glucose-Capillary 155 (*) 70 - 99 mg/dL   Comment 1 Notify RN    GLUCOSE, CAPILLARY     Status: Abnormal   Collection Time    12/12/12  7:28 AM      Result Value Range   Glucose-Capillary 121 (*) 70 - 99 mg/dL   Comment 1 Notify RN    GLUCOSE, CAPILLARY     Status: Abnormal   Collection Time    12/12/12 11:25 AM      Result Value Range   Glucose-Capillary 175 (*) 70 - 99 mg/dL   Comment 1 Notify RN    GLUCOSE, CAPILLARY     Status: Abnormal   Collection Time    12/12/12  4:40 PM      Result Value Range   Glucose-Capillary 133 (*) 70 - 99 mg/dL   Comment 1 Notify RN    GLUCOSE, CAPILLARY     Status: Abnormal   Collection Time    12/12/12  8:30 PM      Result Value Range   Glucose-Capillary 151 (*) 70 - 99 mg/dL    Comment 1 Notify RN    GLUCOSE, CAPILLARY     Status: Abnormal   Collection Time    12/13/12  7:12 AM      Result Value Range   Glucose-Capillary 139 (*) 70 - 99 mg/dL   Comment 1 Notify RN    GLUCOSE, CAPILLARY     Status: Abnormal   Collection Time    12/13/12 11:13 AM      Result Value Range   Glucose-Capillary 167 (*) 70 - 99 mg/dL  GLUCOSE, CAPILLARY     Status: Abnormal   Collection Time    12/13/12  4:24 PM      Result Value Range   Glucose-Capillary 179 (*) 70 - 99 mg/dL   Comment 1 Notify RN    GLUCOSE, CAPILLARY     Status: Abnormal   Collection Time    12/13/12  9:01 PM  Result Value Range   Glucose-Capillary 126 (*) 70 - 99 mg/dL      Vitals reviewed.  Gen: NAD  Eyes:  Pupils reactive to light  Neck: Neck supple. No thyromegaly present.  Cardiovascular:  Irreg Irreg, no Murmur  Pulmonary/Chest: Effort normal and breath sounds normal.  Abdominal: Bowel sounds are normal. She exhibits no distension.  Neurological: She is alert.  Skin:  Minimal maculopapular non erythematous rash no dermatomal pattern Patient is appropriate to basic conversation during exam. She followed simple commands. She was able to provide her name and age. Noted left gaze preference is persistent.Alert.Oriented to self only,Receptive language deficits noted  Dense right homonymous hemianopsia with poor compensation  Motor strength 4+/5 in the right deltoid, biceps, triceps, grip,3-/5 hip flexor knee extensor ankle dorsiflexor plantar flexor  5/5 on the left side. Needs cues to engage the right side.  Sensory absent LT RUE and RLE, intact on left  Mood/Affect: Labile crying R thigh and calf without tenderness to palpation R hip pain with int/ext rotation as well as with flex  Assessment/Plan: 1. Functional deficits secondary to L PCA infarct which require 3+ hours per day of interdisciplinary therapy in a comprehensive inpatient rehab setting. Physiatrist is providing close team  supervision and 24 hour management of active medical problems listed below. Physiatrist and rehab team continue to assess barriers to discharge/monitor patient progress toward functional and medical goals. FIM: FIM - Bathing Bathing Steps Patient Completed: Chest;Right Arm;Left Arm;Abdomen;Left upper leg;Front perineal area;Left lower leg (including foot);Right upper leg Bathing: 4: Min-Patient completes 8-9 64f 10 parts or 75+ percent (performed in sit and stand)  FIM - Upper Body Dressing/Undressing Upper body dressing/undressing steps patient completed: Thread/unthread right sleeve of front closure shirt/dress;Thread/unthread left sleeve of front closure shirt/dress;Pull shirt around back of front closure shirt/dress Upper body dressing/undressing: 4: Min-Patient completed 75 plus % of tasks FIM - Lower Body Dressing/Undressing Lower body dressing/undressing steps patient completed: Pull pants up/down;Don/Doff left shoe;Fasten/unfasten left shoe;Thread/unthread left pants leg;Thread/unthread right pants leg (pulled pants up in standing with supervision) Lower body dressing/undressing: 3: Mod-Patient completed 50-74% of tasks (wearing brief and performed in sit and stand)  FIM - Toileting Toileting steps completed by patient: Performs perineal hygiene Toileting: 2: Max-Patient completed 1 of 3 steps  FIM - Diplomatic Services operational officer Devices: Art gallery manager Transfers: 3-From toilet/BSC: Mod A (lift or lower assist)  FIM - Banker Devices: Arm rests Bed/Chair Transfer: 4: Bed > Chair or W/C: Min A (steadying Pt. > 75%);4: Chair or W/C > Bed: Min A (steadying Pt. > 75%)  FIM - Locomotion: Wheelchair Distance: 100 Locomotion: Wheelchair: 2: Travels 50 - 149 ft with minimal assistance (Pt.>75%) FIM - Locomotion: Ambulation Locomotion: Ambulation Assistive Devices: Designer, industrial/product Ambulation/Gait Assistance: 4: Min assist Locomotion:  Ambulation: 1: Travels less than 50 ft with minimal assistance (Pt.>75%)  Comprehension Comprehension Mode: Auditory Comprehension: 4-Understands basic 75 - 89% of the time/requires cueing 10 - 24% of the time  Expression Expression Mode: Verbal Expression: 3-Expresses basic 50 - 74% of the time/requires cueing 25 - 50% of the time. Needs to repeat parts of sentences.  Social Interaction Social Interaction: 4-Interacts appropriately 75 - 89% of the time - Needs redirection for appropriate language or to initiate interaction.  Problem Solving Problem Solving Mode: Not assessed Problem Solving: 3-Solves basic 50 - 74% of the time/requires cueing 25 - 49% of the time  Memory Memory: 3-Recognizes or recalls 50 - 74% of  the time/requires cueing 25 - 49% of the time  Medical Problem List and Plan:  1. Embolic large left PCA infarct  2. DVT Prophylaxis/Anticoagulation: Xarelto. Monitor CBC  3. Neuropsych: This patient Is not capable of making decisions on his/her own behalf.  4. New onset atrial fibrillation. Continue Cardizem and Toprol as advised.may need to adjust dose  5. Diabetes mellitus with peripheral neuropathy. Hemoglobin A1c 6.1. SSI. gluophage on hold for elevated Creat. Recheck BMET Fair control at present.  6. Hypertension. Patient on Lotrel 10-20 daily prior to admission as well as hydrochlorothiazide 25 mg daily. Am systolic elevation start hs cardura, improved, monitor orthstasis - pt is on toprol and cardizem for rate control with BP borderline  -increased cardizem to 360mg -BP and HR improved, am values high, check orthostatics 7.  R hip pain severe OA on Xray 10/2012, increase tramadol 8.  Dysphagia, will ask SLP to reassess LOS (Days) 20 A FACE TO FACE EVALUATION WAS PERFORMED  Hayato Guaman E 12/14/2012, 6:54 AM

## 2012-12-14 NOTE — Discharge Summary (Signed)
  Discharge summary job # [id number]

## 2012-12-14 NOTE — Progress Notes (Signed)
Occupational Therapy Session Notes  Patient Details  Name: [name] MRN: [id number] Date of Birth: [birth year]  Today's Date: 12/14/2012 Time: 0900-1005 and 105-200 Time Calculation (min): 65 min and 55 min  Short Term Goals: Week 3:  OT Short Term Goal 1 (Week 3): Unmet LTGs of overall Mod Assist for BADLs and Min assist for toilet transfers  Skilled Therapeutic Interventions/Progress Updates:  1)  Self care retraining to include toileting, toilet transfers, bath and dressing.  Focused session on bed mobility, sit><stands, standing tolerance and dynamic standing balance, visual scanning and attention to right, forced use of RUE during BADL.  Patient's mobility limited today by pain in LEs and painful BM. RN and PA examined patient regarding skin irritation and itching as well as bloody stool.  Patient continues to demonstrate difficulty with aphasia, apraxia and perservation during BADL tasks and mobility.  Reviewed D/C plan to SNF and patient wanted to know who made that decision.  After discussion, patient verbalized understanding hat her family could not care for her at this time.  2)  Addressed LB dressing, grooming in sitting at sink, toilet transfers and toileting.  Focused session on following one step commands which patient needed multi-modal cues to follow verbal commands.  When patient is unsure of where or how to move in space, she responds well to guiding her hand for hand placement.    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, aphasia, apraxia, visual deficits:diplopia, probable right field cut, decreased cognition Restrictions Weight Bearing Restrictions: No Other Position/Activity Restrictions: Per sister, pt had a bad right hip before this and was to have surgery, this is really painful to her now Pain: 1)  Bilateral knee pain and right hip pain, not rated, limiting mobility during  BADL tasks, RN notified and medication provided.  2)  No reports of pain ADL:  See FIM for current functional status  Therapy/Group: Individual Therapy  [name] 12/14/2012, 10:15 AM

## 2012-12-15 LAB — GLUCOSE, CAPILLARY: Glucose-Capillary: 144 mg/dL — ABNORMAL HIGH (ref 70–99)

## 2012-12-15 MED ORDER — GLIMEPIRIDE 2 MG PO TABS
2.0000 mg | ORAL_TABLET | Freq: Every day | ORAL | Status: DC
  Administered 2012-12-15: 2 mg via ORAL
  Filled 2012-12-15 (×2): qty 1

## 2012-12-15 NOTE — Plan of Care (Signed)
Problem: RH Bed Mobility Goal: LTG Patient will perform bed mobility with assist (PT) LTG: Patient will perform bed mobility with assistance, with/without cues (PT).  Outcome: Not Met (add Reason) Patient supine to sit with supervision. Patient requires mod assist with sit to supine to lift LE's due to pain.  Problem: RH Car Transfers Goal: LTG Patient will perform car transfers with assist (PT) LTG: Patient will perform car transfers with assistance (PT).  Outcome: Not Applicable Date Met:  12/15/12 Goal N/A due to discharge to SNF

## 2012-12-15 NOTE — Progress Notes (Addendum)
Patient ID: [name], female   DOB: Jan 29, 1946, 67 y.o.   MRN: [id number] Subjective/Complaints: Aphasic, doesn't remember therapy yest  Review of Systems  Unable to perform ROS: medical condition   Objective: Vital Signs: Blood pressure 142/86, pulse 72, temperature 97.7 F (36.5 C), temperature source Oral, resp. rate 22, height 5\' 3"  (1.6 m), weight 105.87 kg (233 lb 6.4 oz), SpO2 93.00%. No results found. Results for orders placed during the hospital encounter of 11/24/12 (from the past 72 hour(s))  GLUCOSE, CAPILLARY     Status: Abnormal   Collection Time    12/12/12  7:28 AM      Result Value Range   Glucose-Capillary 121 (*) 70 - 99 mg/dL   Comment 1 Notify RN    GLUCOSE, CAPILLARY     Status: Abnormal   Collection Time    12/12/12 11:25 AM      Result Value Range   Glucose-Capillary 175 (*) 70 - 99 mg/dL   Comment 1 Notify RN    GLUCOSE, CAPILLARY     Status: Abnormal   Collection Time    12/12/12  4:40 PM      Result Value Range   Glucose-Capillary 133 (*) 70 - 99 mg/dL   Comment 1 Notify RN    GLUCOSE, CAPILLARY     Status: Abnormal   Collection Time    12/12/12  8:30 PM      Result Value Range   Glucose-Capillary 151 (*) 70 - 99 mg/dL   Comment 1 Notify RN    GLUCOSE, CAPILLARY     Status: Abnormal   Collection Time    12/13/12  7:12 AM      Result Value Range   Glucose-Capillary 139 (*) 70 - 99 mg/dL   Comment 1 Notify RN    GLUCOSE, CAPILLARY     Status: Abnormal   Collection Time    12/13/12 11:13 AM      Result Value Range   Glucose-Capillary 167 (*) 70 - 99 mg/dL  GLUCOSE, CAPILLARY     Status: Abnormal   Collection Time    12/13/12  4:24 PM      Result Value Range   Glucose-Capillary 179 (*) 70 - 99 mg/dL   Comment 1 Notify RN    GLUCOSE, CAPILLARY     Status: Abnormal   Collection Time    12/13/12  9:01 PM      Result Value Range   Glucose-Capillary 126 (*) 70 - 99 mg/dL  GLUCOSE, CAPILLARY     Status: Abnormal   Collection Time   12/14/12  7:15 AM      Result Value Range   Glucose-Capillary 146 (*) 70 - 99 mg/dL   Comment 1 Notify RN    GLUCOSE, CAPILLARY     Status: Abnormal   Collection Time    12/14/12 11:13 AM      Result Value Range   Glucose-Capillary 201 (*) 70 - 99 mg/dL   Comment 1 Notify RN    GLUCOSE, CAPILLARY     Status: Abnormal   Collection Time    12/14/12  4:35 PM      Result Value Range   Glucose-Capillary 151 (*) 70 - 99 mg/dL   Comment 1 Notify RN    GLUCOSE, CAPILLARY     Status: Abnormal   Collection Time    12/14/12  8:30 PM      Result Value Range   Glucose-Capillary 173 (*) 70 - 99 mg/dL   Comment  1 Notify RN        Vitals reviewed.  Gen: NAD  Eyes:  Pupils reactive to light  Neck: Neck supple. No thyromegaly present.  Cardiovascular:  Irreg Irreg, no Murmur  Pulmonary/Chest: Effort normal and breath sounds normal.  Abdominal: Bowel sounds are normal. She exhibits no distension.  Neurological: She is alert.  Skin:  Minimal maculopapular non erythematous rash no dermatomal pattern Patient is appropriate to basic conversation during exam. She followed simple commands. She was able to provide her name and age. Noted left gaze preference is persistent.Alert.Oriented to self only,Receptive language deficits noted  Dense right homonymous hemianopsia with poor compensation  Motor strength 4+/5 in the right deltoid, biceps, triceps, grip,3-/5 hip flexor knee extensor ankle dorsiflexor plantar flexor  5/5 on the left side. Needs cues to engage the right side.  Sensory absent LT RUE and RLE, intact on left  Mood/Affect: Labile crying R thigh and calf without tenderness to palpation R hip pain with int/ext rotation as well as with flex  Assessment/Plan: 1. Functional deficits secondary to L PCA infarct which require 3+ hours per day of interdisciplinary therapy in a comprehensive inpatient rehab setting. Physiatrist is providing close team supervision and 24 hour management of  active medical problems listed below. Physiatrist and rehab team continue to assess barriers to discharge/monitor patient progress toward functional and medical goals. FIM: FIM - Bathing Bathing Steps Patient Completed: Chest;Right Arm;Left Arm;Abdomen;Left upper leg;Front perineal area;Right upper leg Bathing: 3: Mod-Patient completes 5-7 25f 10 parts or 50-74% (performed in sit and stand)  FIM - Upper Body Dressing/Undressing Upper body dressing/undressing steps patient completed: Thread/unthread right sleeve of pullover shirt/dresss;Thread/unthread left sleeve of pullover shirt/dress;Put head through opening of pull over shirt/dress;Pull shirt over trunk Upper body dressing/undressing: 5: Set-up assist to: Obtain clothing/put away FIM - Lower Body Dressing/Undressing Lower body dressing/undressing steps patient completed: Pull pants up/down;Thread/unthread left pants leg;Thread/unthread right pants leg;Don/Doff left shoe;Fasten/unfasten left shoe;Don/Doff left sock (pulled pants up in standing with supervision) Lower body dressing/undressing: 3: Mod-Patient completed 50-74% of tasks (performed in sit and stand)  FIM - Toileting Toileting steps completed by patient: Adjust clothing prior to toileting;Adjust clothing after toileting Toileting Assistive Devices: Grab bar or rail for support Toileting: 3: Mod-Patient completed 2 of 3 steps  FIM - Diplomatic Services operational officer Devices: Grab bars;Elevated toilet seat Toilet Transfers: 4-To toilet/BSC: Min A (steadying Pt. > 75%);4-From toilet/BSC: Min A (steadying Pt. > 75%)  FIM - Bed/Chair Transfer Bed/Chair Transfer Assistive Devices: Bed rails;Arm rests;HOB elevated Bed/Chair Transfer: 3: Chair or W/C > Bed: Mod A (lift or lower assist);3: Bed > Chair or W/C: Mod A (lift or lower assist);5: Sit > Supine: Supervision (verbal cues/safety issues);5: Supine > Sit: Supervision (verbal cues/safety issues)  FIM - Locomotion:  Wheelchair Distance: 85 Locomotion: Wheelchair: 2: Travels 50 - 149 ft with minimal assistance (Pt.>75%) FIM - Locomotion: Ambulation Locomotion: Ambulation Assistive Devices: Designer, industrial/product Ambulation/Gait Assistance: 4: Min assist Locomotion: Ambulation: 1: Travels less than 50 ft with minimal assistance (Pt.>75%)  Comprehension Comprehension Mode: Auditory Comprehension: 4-Understands basic 75 - 89% of the time/requires cueing 10 - 24% of the time  Expression Expression Mode: Verbal Expression: 3-Expresses basic 50 - 74% of the time/requires cueing 25 - 50% of the time. Needs to repeat parts of sentences.  Social Interaction Social Interaction: 4-Interacts appropriately 75 - 89% of the time - Needs redirection for appropriate language or to initiate interaction.  Problem Solving Problem Solving Mode: Not assessed  Problem Solving: 3-Solves basic 50 - 74% of the time/requires cueing 25 - 49% of the time  Memory Memory: 3-Recognizes or recalls 50 - 74% of the time/requires cueing 25 - 49% of the time  Medical Problem List and Plan:  1. Embolic large left PCA infarct  2. DVT Prophylaxis/Anticoagulation: Xarelto. Monitor CBC  3. Neuropsych: This patient Is not capable of making decisions on his/her own behalf.  4. New onset atrial fibrillation. Continue Cardizem and Toprol as advised.may need to adjust dose  5. Diabetes mellitus with peripheral neuropathy. Hemoglobin A1c 6.1. SSI. gluophage on hold for elevated Creat. Creat elevated since Oct will trial amaryl instead 6. Hypertension. Patient on Lotrel 10-20 daily prior to admission as well as hydrochlorothiazide 25 mg daily. Am systolic elevation start hs cardura, improved, monitor orthstasis - pt is on toprol and cardizem for rate control with BP borderline  -increased cardizem to 360mg -BP and HR improved, am values high, check orthostatics 7.  R hip pain severe OA on Xray 10/2012, increase tramadol 8.  Dysphagia, will ask SLP to  reassess LOS (Days) 21 A FACE TO FACE EVALUATION WAS PERFORMED  KIRSTEINS,ANDREW E 12/15/2012, 7:07 AM

## 2012-12-15 NOTE — Progress Notes (Signed)
Physical Therapy Discharge Summary  Patient Details  Name: [name] MRN: [id number] Date of Birth: June 25, 1946  Today's Date: 12/15/2012 Time:  -     Patient has met 7 of 8 long term goals due to improved activity tolerance, improved balance, improved postural control, increased strength, decreased pain and functional use of  right lower extremity.  Patient to discharge at a wheelchair level Min Assist.   Patient's care partner unavailable to provide the necessary physical and cognitive assistance at discharge. Patient has made tremendous gains going from +2 total assist to minimal assistance with short distance ambulation. Patient fluctuates due to pain in right hip and knee.  Reasons goals not met: Car and furniture goals n/a due to discharge change to SNF. Patient met all other goals except bed mobility. Patient is supervision for supine to sit with increased time. Patient requires mod assist with sit to supine to lift LE's due to pain.  Recommendation:  Patient will benefit from ongoing skilled PT services in skilled nursing facility setting to continue to advance safe functional mobility, address ongoing impairments in cognition, aphasia, apraxia, visual deficits, dependency with functional mobility, and minimize fall risk.  Equipment: No equipment provided  Reasons for discharge: discharge from hospital  Patient/family agrees with progress made and goals achieved: Yes  PT Discharge Precautions/Restrictions Precautions Precautions: Fall Precaution Comments: H/O pain bil knees and R hip due to OA, new onset aphasia, apraxia, visual deficits:diplopia,  right field cut, decreased cognition Restrictions Weight Bearing Restrictions: No Other Position/Activity Restrictions: Pt had a bad right hip before this and was to have surgery, this is painful at times  Vital Signs Therapy Vitals Temp: 97.7 F (36.5 C) Temp src: Oral Pulse Rate: 72 Resp: 22 BP: 142/86 mmHg Patient  Position, if appropriate: Sitting Oxygen Therapy SpO2: 93 % O2 Device: None (Room air) Pain   Vision/Perception  Vision - History Baseline Vision: Wears glasses only for reading Patient Visual Report: Diplopia Perception Perception: Impaired Inattention/Neglect: Does not attend to right visual field Praxis Praxis: Impaired Praxis Impairment Details: Motor planning;Perseveration;Ideomotor;Ideation  Cognition Overall Cognitive Status: Impaired/Different from baseline Arousal/Alertness: Awake/alert Orientation Level: Oriented to person;Oriented to place Sensation Sensation Light Touch: Impaired by gross assessment Stereognosis: Impaired by gross assessment Hot/Cold: Not tested Proprioception: Impaired by gross assessment Additional Comments: Unable to formally assess due to language impairments Motor  Motor Motor: Hemiplegia  Mobility Bed Mobility Supine to Sit: 5: Supervision (increased time) Sit to Supine: 3: Mod assist (needs assist to lift LE's onto bed due to pain) Transfers Sit to Stand: 5: Supervision Stand to Sit: 5: Supervision (cueing due to visual deficits) Stand Pivot Transfers: 4: Min assist (assist to guide walker due to visual deficits) Locomotion  Ambulation Ambulation: Yes Ambulation/Gait Assistance: 4: Min assist Ambulation Distance (Feet): 28 Feet Assistive device: Rolling walker Ambulation/Gait Assistance Details: manual assist to guide walker and min assist for balance Gait Gait: Yes Gait Pattern: Impaired Gait Pattern: Decreased stance time - right;Decreased step length - right;Antalgic;Decreased dorsiflexion - right;Decreased dorsiflexion - left Stairs / Additional Locomotion Stairs: No Corporate treasurer: Yes Wheelchair Assistance: 4: Min Education officer, museum: Both upper extremities Wheelchair Parts Management: Needs assistance Distance: 85 to 100 feet   See FIM for current functional status  Arelia Longest M 12/15/2012, 8:47 AM

## 2012-12-15 NOTE — Progress Notes (Signed)
Pt discharged at 1125 via stretcher to [facility] nursing facility.  Belongings with pt.

## 2012-12-15 NOTE — Progress Notes (Signed)
Social Work  Discharge Note  The overall goal for the admission was met for:   Discharge location: No - plan changed to snf  Length of Stay: Yes - actually d/c'd slightly earlier than planned with bed availability  Discharge activity level: No  Home/community participation: No  Services provided included: MD, RD, PT, OT, SLP, RN, TR, Pharmacy, Neuropsych and SW  Financial Services: Medicare and Medicaid  Science writer)  Follow-up services arranged: Other: SNF placement at [facility]  Comments (or additional information):  Patient/Family verbalized understanding of follow-up arrangements: Yes  Individual responsible for coordination of the follow-up plan: patient  Confirmed correct DME delivered: NA  Devyn Sheerin

## 2012-12-16 ENCOUNTER — Encounter: Payer: Self-pay | Admitting: Nurse Practitioner

## 2012-12-16 ENCOUNTER — Non-Acute Institutional Stay (SKILLED_NURSING_FACILITY): Payer: PRIVATE HEALTH INSURANCE | Admitting: Nurse Practitioner

## 2012-12-16 DIAGNOSIS — E119 Type 2 diabetes mellitus without complications: Secondary | ICD-10-CM

## 2012-12-16 DIAGNOSIS — I639 Cerebral infarction, unspecified: Secondary | ICD-10-CM

## 2012-12-16 DIAGNOSIS — I4891 Unspecified atrial fibrillation: Secondary | ICD-10-CM

## 2012-12-16 DIAGNOSIS — I6350 Cerebral infarction due to unspecified occlusion or stenosis of unspecified cerebral artery: Secondary | ICD-10-CM

## 2012-12-16 DIAGNOSIS — I10 Essential (primary) hypertension: Secondary | ICD-10-CM

## 2012-12-16 NOTE — Progress Notes (Signed)
Patient ID: [name], female   DOB: [birth year], 67 y.o.   MRN: [id number]   PCP: Astrid Divine, MD  Code Status:  full  No Known Allergies  Chief Complaint: AV: follow up hospitalization  HPI:  [name] is a 67 y.o.female with a PMH of hypertension, diabetes mellitus with peripheral neuropathy. Pt admitted to hospital on 11/18/2012 with headache and elevated blood pressure 172/127. EKG showed atrial fibrillation with RVR. 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 Echocardiogram with ejection fraction of 65% and no wall motion abnormalities. Carotid Dopplers with no ICA stenosis. Patient did not receive TPA. Started on Cardizem infusion for atrial fibrillation and transition to by mouth. 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 Patient with word finding problems. Able to answer yes no questions as well as give basic biographical information.Neurology services consulted maintained on aspirin therapy as well as Xarelto. Physical and occupational therapy evaluations completed 11/19/2012 with recommendations of physical medicine rehabilitation consult to consider inpatient rehabilitation services. Patient was felt to be a good candidate for inpatient rehabilitation services and was admitted for comprehensive rehabilitation program. Now she has been discharge to [facility] for therapy. Pt is without complaints at time of exam however she has increased difficulty in finding words. Staff does not have any concerns at this time.  Review of Systems:  Review of Systems  Unable to perform ROS: other  due to aphagia   Past Medical History  Diagnosis Date  . Hypertension   . Diabetes mellitus   . Glaucoma   . Cataract    No past surgical history on file. Social History:   reports that she has never smoked. She does not have any smokeless tobacco history on file. She reports that she does not  drink alcohol or use illicit drugs.  Family History  Problem Relation Age of Onset  . Stroke Mother   . Diabetes type II Mother   . CAD Father     Medications: Patient's Medications  New Prescriptions   No medications on file  Previous Medications   ASPIRIN 81 MG CHEWABLE TABLET    Chew 1 tablet (81 mg total) by mouth daily.   CETIRIZINE (ZYRTEC) 10 MG TABLET    Take 10 mg by mouth daily.   CITALOPRAM (CELEXA) 10 MG TABLET    Take 10 mg by mouth daily.   DILTIAZEM (CARDIZEM CD) 360 MG 24 HR CAPSULE    Take 360 mg by mouth daily.   DOXAZOSIN (CARDURA) 1 MG TABLET    Take 1 mg by mouth at bedtime.   METOPROLOL SUCCINATE (TOPROL-XL) 50 MG 24 HR TABLET    Take 100 mg by mouth daily. Take with or immediately following a meal.   POLYETHYLENE GLYCOL (MIRALAX / GLYCOLAX) PACKET    Take 17 g by mouth daily.   RIVAROXABAN (XARELTO) 15 MG TABS TABLET    Take 1 tablet (15 mg total) by mouth daily with supper.   TRAMADOL (ULTRAM) 50 MG TABLET    Take 100 mg by mouth every 8 (eight) hours as needed for pain.  Modified Medications   No medications on file  Discontinued Medications   DILTIAZEM (CARDIZEM CD) 240 MG 24 HR CAPSULE    Take 1 capsule (240 mg total) by mouth daily.   INSULIN ASPART (NOVOLOG) 100 UNIT/ML INJECTION    Inject 0-9 Units into the skin 3 (three) times daily with meals.  Physical Exam:  Filed Vitals:   12/16/12 1456  BP: 170/90  Pulse: 65  Temp: 97.1 F (36.2 C)  Resp: 20   Physical Exam  Constitutional: She appears well-developed and well-nourished. No distress.  HENT:  Head: Normocephalic and atraumatic.  Eyes: Conjunctivae and EOM are normal. Pupils are equal, round, and reactive to light.  Cardiovascular: Normal rate, regular rhythm and normal heart sounds.   Pulmonary/Chest: Breath sounds normal.  Abdominal: Soft. Bowel sounds are normal. She exhibits no distension. There is no tenderness.  Musculoskeletal: She exhibits no edema and no tenderness.   Neurological: She is alert.  Skin: Skin is dry. She is not diaphoretic.      Labs reviewed: Basic Metabolic Panel:  Recent Labs  46/96/29 0625 11/30/12 0937 12/07/12 0945  NA 135 139 135  K 3.7 4.3 3.9  CL 100 103 101  CO2 26 27 25   GLUCOSE 146* 211* 180*  BUN 32* 36* 28*  CREATININE 1.59* 1.69* 1.62*  CALCIUM 9.0 9.5 9.2   Liver Function Tests:  Recent Labs  05/17/12 2230 11/18/12 0904 11/25/12 0625  AST 21 17 22   ALT 18 19 25   ALKPHOS 88 69 63  BILITOT 0.3 0.2* 0.4  PROT 8.3 8.4* 7.0  ALBUMIN 3.6 3.7 2.6*   No results found for this basename: LIPASE, AMYLASE,  in the last 8760 hours No results found for this basename: AMMONIA,  in the last 8760 hours CBC:  Recent Labs  11/17/12 2347  11/18/12 0904 11/25/12 0625 12/07/12 0945  WBC 9.9  --  8.7 7.1 4.0  NEUTROABS 6.5  --  7.0 3.8  --   HGB 12.2  < > 12.2 12.5 13.1  HCT 36.4  < > 36.1 36.9 38.5  MCV 87.1  --  87.0 84.8 86.7  PLT 348  --  368 371 355  < > = values in this interval not displayed. Cardiac Enzymes:  Recent Labs  11/18/12 0330  TROPONINI <0.30   BNP: No components found with this basename: POCBNP,  CBG:  Recent Labs  12/14/12 1635 12/14/12 2030 12/15/12 0730  GLUCAP 151* 173* 144*    Radiological Exams:  Assessment/Plan CKD (chronic kidney disease) stage 3, GFR 30-59 ml/min Will follow up bmp  Hypertension, accelerated bp still elevated- will get bmp; for now start on clonidine 0.1mg  PO BID and cont to monitor.   Diabetes mellitus, controlled Previously on metformin but this on hold due to Cr- will cont ACHS cbgs   CVA (cerebral infarction) Patient is stable; here for STR per therapy.

## 2012-12-16 NOTE — Assessment & Plan Note (Signed)
bp still elevated- will get bmp; for now start on clonidine 0.1mg  PO BID and cont to monitor.

## 2012-12-16 NOTE — Assessment & Plan Note (Signed)
Previously on metformin but this on hold due to Cr- will cont ACHS cbgs

## 2012-12-16 NOTE — Assessment & Plan Note (Signed)
Will follow up bmp

## 2012-12-27 NOTE — Assessment & Plan Note (Addendum)
Patient is stable; here for STR per therapy.

## 2013-01-10 ENCOUNTER — Encounter: Payer: PRIVATE HEALTH INSURANCE | Attending: Physical Medicine & Rehabilitation

## 2013-01-10 ENCOUNTER — Inpatient Hospital Stay: Payer: PRIVATE HEALTH INSURANCE | Admitting: Physical Medicine & Rehabilitation

## 2013-01-27 ENCOUNTER — Emergency Department (HOSPITAL_COMMUNITY): Payer: PRIVATE HEALTH INSURANCE

## 2013-01-27 ENCOUNTER — Encounter (HOSPITAL_COMMUNITY): Payer: Self-pay | Admitting: Emergency Medicine

## 2013-01-27 ENCOUNTER — Other Ambulatory Visit: Payer: Self-pay | Admitting: Orthopedic Surgery

## 2013-01-27 ENCOUNTER — Emergency Department (HOSPITAL_COMMUNITY)
Admission: EM | Admit: 2013-01-27 | Discharge: 2013-01-28 | Disposition: A | Discharge status: Skilled Nursing Facility | Payer: PRIVATE HEALTH INSURANCE | Source: Home / Self Care | Attending: Emergency Medicine | Admitting: Emergency Medicine

## 2013-01-27 DIAGNOSIS — H409 Unspecified glaucoma: Secondary | ICD-10-CM | POA: Insufficient documentation

## 2013-01-27 DIAGNOSIS — E119 Type 2 diabetes mellitus without complications: Secondary | ICD-10-CM | POA: Insufficient documentation

## 2013-01-27 DIAGNOSIS — Z8673 Personal history of transient ischemic attack (TIA), and cerebral infarction without residual deficits: Secondary | ICD-10-CM | POA: Insufficient documentation

## 2013-01-27 DIAGNOSIS — M169 Osteoarthritis of hip, unspecified: Secondary | ICD-10-CM

## 2013-01-27 DIAGNOSIS — G9340 Encephalopathy, unspecified: Secondary | ICD-10-CM | POA: Diagnosis present

## 2013-01-27 DIAGNOSIS — I639 Cerebral infarction, unspecified: Secondary | ICD-10-CM

## 2013-01-27 DIAGNOSIS — I4891 Unspecified atrial fibrillation: Secondary | ICD-10-CM | POA: Insufficient documentation

## 2013-01-27 DIAGNOSIS — F29 Unspecified psychosis not due to a substance or known physiological condition: Secondary | ICD-10-CM | POA: Insufficient documentation

## 2013-01-27 DIAGNOSIS — R29810 Facial weakness: Secondary | ICD-10-CM | POA: Diagnosis present

## 2013-01-27 DIAGNOSIS — G608 Other hereditary and idiopathic neuropathies: Secondary | ICD-10-CM | POA: Diagnosis present

## 2013-01-27 DIAGNOSIS — Z7982 Long term (current) use of aspirin: Secondary | ICD-10-CM | POA: Insufficient documentation

## 2013-01-27 DIAGNOSIS — I6350 Cerebral infarction due to unspecified occlusion or stenosis of unspecified cerebral artery: Principal | ICD-10-CM | POA: Diagnosis present

## 2013-01-27 DIAGNOSIS — H518 Other specified disorders of binocular movement: Secondary | ICD-10-CM | POA: Diagnosis present

## 2013-01-27 DIAGNOSIS — Z8669 Personal history of other diseases of the nervous system and sense organs: Secondary | ICD-10-CM | POA: Insufficient documentation

## 2013-01-27 DIAGNOSIS — I10 Essential (primary) hypertension: Secondary | ICD-10-CM | POA: Insufficient documentation

## 2013-01-27 DIAGNOSIS — Z7901 Long term (current) use of anticoagulants: Secondary | ICD-10-CM

## 2013-01-27 DIAGNOSIS — R410 Disorientation, unspecified: Secondary | ICD-10-CM

## 2013-01-27 DIAGNOSIS — Z79899 Other long term (current) drug therapy: Secondary | ICD-10-CM

## 2013-01-27 DIAGNOSIS — N183 Chronic kidney disease, stage 3 unspecified: Secondary | ICD-10-CM | POA: Diagnosis present

## 2013-01-27 DIAGNOSIS — Z66 Do not resuscitate: Secondary | ICD-10-CM | POA: Diagnosis present

## 2013-01-27 DIAGNOSIS — F329 Major depressive disorder, single episode, unspecified: Secondary | ICD-10-CM | POA: Diagnosis present

## 2013-01-27 DIAGNOSIS — F0390 Unspecified dementia without behavioral disturbance: Secondary | ICD-10-CM | POA: Diagnosis present

## 2013-01-27 DIAGNOSIS — G8929 Other chronic pain: Secondary | ICD-10-CM | POA: Diagnosis present

## 2013-01-27 DIAGNOSIS — I129 Hypertensive chronic kidney disease with stage 1 through stage 4 chronic kidney disease, or unspecified chronic kidney disease: Secondary | ICD-10-CM | POA: Diagnosis present

## 2013-01-27 DIAGNOSIS — F3289 Other specified depressive episodes: Secondary | ICD-10-CM | POA: Diagnosis present

## 2013-01-27 LAB — CBC WITH DIFFERENTIAL/PLATELET
Basophils Absolute: 0 10*3/uL (ref 0.0–0.1)
Basophils Relative: 0 % (ref 0–1)
Eosinophils Absolute: 0 10*3/uL (ref 0.0–0.7)
Lymphs Abs: 1.1 10*3/uL (ref 0.7–4.0)
MCH: 28.6 pg (ref 26.0–34.0)
MCHC: 33.3 g/dL (ref 30.0–36.0)
Neutrophils Relative %: 81 % — ABNORMAL HIGH (ref 43–77)
Platelets: 353 10*3/uL (ref 150–400)
RBC: 4.82 MIL/uL (ref 3.87–5.11)
RDW: 13 % (ref 11.5–15.5)

## 2013-01-27 LAB — URINALYSIS, ROUTINE W REFLEX MICROSCOPIC
Ketones, ur: 15 mg/dL — AB
Leukocytes, UA: NEGATIVE
Nitrite: NEGATIVE
Protein, ur: >300 mg/dL — AB

## 2013-01-27 LAB — URINE MICROSCOPIC-ADD ON

## 2013-01-27 LAB — POCT I-STAT 3, ART BLOOD GAS (G3+)
Bicarbonate: 27.6 mEq/L — ABNORMAL HIGH (ref 20.0–24.0)
TCO2: 29 mmol/L (ref 0–100)
pCO2 arterial: 43.9 mmHg (ref 35.0–45.0)
pH, Arterial: 7.406 (ref 7.350–7.450)

## 2013-01-27 LAB — POCT I-STAT TROPONIN I: Troponin i, poc: 0 ng/mL (ref 0.00–0.08)

## 2013-01-27 LAB — CG4 I-STAT (LACTIC ACID): Lactic Acid, Venous: 1.76 mmol/L (ref 0.5–2.2)

## 2013-01-27 LAB — GLUCOSE, CAPILLARY: Glucose-Capillary: 187 mg/dL — ABNORMAL HIGH (ref 70–99)

## 2013-01-27 LAB — PROCALCITONIN: Procalcitonin: <0.1 ng/mL

## 2013-01-27 MED ORDER — SODIUM CHLORIDE 0.9 % IV SOLN
1000.0000 mL | INTRAVENOUS | Status: DC
  Administered 2013-01-27: 1000 mL via INTRAVENOUS

## 2013-01-27 MED ORDER — LORAZEPAM 2 MG/ML IJ SOLN
1.0000 mg | Freq: Once | INTRAMUSCULAR | Status: AC
  Administered 2013-01-27: 1 mg via INTRAVENOUS
  Filled 2013-01-27: qty 1

## 2013-01-27 MED ORDER — LORAZEPAM 2 MG/ML IJ SOLN
1.0000 mg | Freq: Once | INTRAMUSCULAR | Status: AC
  Administered 2013-01-27: 1 mg via INTRAVENOUS

## 2013-01-27 MED ORDER — LORAZEPAM 2 MG/ML IJ SOLN
INTRAMUSCULAR | Status: AC
  Filled 2013-01-27: qty 1

## 2013-01-27 NOTE — ED Provider Notes (Signed)
History     CSN: [id number]  Arrival date & time 01/27/13  1656   First MD Initiated Contact with Patient 01/27/13 1730      Chief Complaint  Patient presents with  . Altered Mental Status  . Atrial Fibrillation    (Consider location/radiation/quality/duration/timing/severity/associated sxs/prior treatment) HPI Comments: [name] is a 67 y.o. female who is here for evaluation of a confusion and altered mental status. 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 Today, she went to see a orthopedist about possible hip surgery. At that time, she was noticed to be confused. She went back to the nursing facility. From there. She was sent here for evaluation of confusion. The patient cannot contribute to history. Family members state that her confusion, now, it is just like when she had a stroke. She has had intermittent periods of confusion since being discharged, from the hospital after the stroke.  Level V caveat-confusion  Patient is a 67 y.o. female presenting with altered mental status and atrial fibrillation. The history is provided by a relative and the nursing home.  Altered Mental Status Atrial Fibrillation    Past Medical History  Diagnosis Date  . Hypertension   . Diabetes mellitus   . Glaucoma   . Cataract     History reviewed. No pertinent past surgical history.  Family History  Problem Relation Age of Onset  . Stroke Mother   . Diabetes type II Mother   . CAD Father     History  Substance Use Topics  . Smoking status: Never Smoker   . Smokeless tobacco: Not on file  . Alcohol Use: No    OB History   Grav Para Term Preterm Abortions TAB SAB Ect Mult Living                  Review of Systems  Unable to perform ROS Psychiatric/Behavioral: Positive for altered mental status.    Allergies  Review of patient's allergies indicates no known allergies.  Home Medications   Current Outpatient Rx  Name  Route  Sig   Dispense  Refill  . aspirin 81 MG chewable tablet   Oral   Chew 1 tablet (81 mg total) by mouth daily.         . cetirizine (ZYRTEC) 10 MG tablet   Oral   Take 10 mg by mouth daily.         . citalopram (CELEXA) 10 MG tablet   Oral   Take 10 mg by mouth daily.         . cloNIDine (CATAPRES) 0.1 MG tablet   Oral   Take 0.1 mg by mouth 2 (two) times daily.         Marland Kitchen diltiazem (CARDIZEM CD) 360 MG 24 hr capsule   Oral   Take 360 mg by mouth daily.         Marland Kitchen doxazosin (CARDURA) 1 MG tablet   Oral   Take 1 mg by mouth at bedtime.         . metoprolol succinate (TOPROL-XL) 100 MG 24 hr tablet   Oral   Take 100 mg by mouth daily. Take with or immediately following a meal.         . polyethylene glycol (MIRALAX / GLYCOLAX) packet   Oral   Take 17 g by mouth daily.         . Rivaroxaban (XARELTO) 15 MG TABS tablet   Oral  Take 1 tablet (15 mg total) by mouth daily with supper.   30 tablet      . traMADol (ULTRAM-ER) 100 MG 24 hr tablet   Oral   Take 100 mg by mouth every 8 (eight) hours as needed for pain.           BP 169/95  Pulse 97  Temp(Src) 98.8 F (37.1 C) (Rectal)  Resp 11  SpO2 97%  Physical Exam  Nursing note and vitals reviewed. Constitutional: She is oriented to person, place, and time. She appears well-developed.  Appears older than stated age  HENT:  Head: Normocephalic and atraumatic.  Eyes: Conjunctivae and EOM are normal. Pupils are equal, round, and reactive to light.  Neck: Normal range of motion and phonation normal. Neck supple.  Cardiovascular: Normal rate, regular rhythm and intact distal pulses.   Pulmonary/Chest: Effort normal and breath sounds normal. She exhibits no tenderness.  Abdominal: Soft. She exhibits no distension. There is no tenderness. There is no guarding.  Musculoskeletal: Normal range of motion.  Neurological: She is alert and oriented to person, place, and time. She has normal strength. She exhibits  normal muscle tone.  Skin: Skin is warm and dry.  Psychiatric: She has a normal mood and affect. Her behavior is normal. Judgment and thought content normal.    ED Course  Procedures (including critical care time)   11:04 PM-Consult complete with Dr. Roseanne Reno, Neurohospitalist. Patient case explained and discussed. He states that this appears to be expected progression after recent CVA, possibly with dementia. He states she can be managed in the SNF by her PCP. Call ended at 11:10 PM    Date: 17:05  Rate: 77  Rhythm: atrial fibrillation  QRS Axis: normal  PR and QT Intervals: normal QT  ST/T Wave abnormalities: nonspecific T wave changes  PR and QRS Conduction Disutrbances:none  Narrative Interpretation:   Old EKG Reviewed: unchanged   Labs Reviewed  CBC WITH DIFFERENTIAL - Abnormal; Notable for the following:    Neutrophils Relative % 81 (*)    Monocytes Relative 2 (*)    All other components within normal limits  URINALYSIS, ROUTINE W REFLEX MICROSCOPIC - Abnormal; Notable for the following:    Glucose, UA 100 (*)    Hgb urine dipstick MODERATE (*)    Ketones, ur 15 (*)    Protein, ur >300 (*)    All other components within normal limits  GLUCOSE, CAPILLARY - Abnormal; Notable for the following:    Glucose-Capillary 187 (*)    All other components within normal limits  URINE MICROSCOPIC-ADD ON - Abnormal; Notable for the following:    Bacteria, UA FEW (*)    Casts HYALINE CASTS (*)    All other components within normal limits  POCT I-STAT 3, BLOOD GAS (G3+) - Abnormal; Notable for the following:    pO2, Arterial 64.0 (*)    Bicarbonate 27.6 (*)    All other components within normal limits  CULTURE, BLOOD (ROUTINE X 2)  CULTURE, BLOOD (ROUTINE X 2)  URINE CULTURE  PROCALCITONIN  POCT I-STAT TROPONIN I  CG4 I-STAT (LACTIC ACID)   Ct Head Wo Contrast  01/27/2013   *RADIOLOGY REPORT*  Clinical Data: Unresponsive.  Confused and combative.  CT HEAD WITHOUT CONTRAST   Technique:  Contiguous axial images were obtained from the base of the skull through the vertex without contrast.  Comparison: Head CTA 11/18/2012.  Findings: Evolving area of encephalomalacia in the left occipital lobe, posterior left parietal region  and posterior left temporal region, compatible with old left PCA territory infarction.  There is also a small area of cortical atrophy and encephalomalacia in the posterior right parietal region which is similar to the prior study.  No definite acute intracranial abnormality.  Specifically, no definite signs of acute/subacute cerebral ischemia, no evidence of acute intracranial hemorrhage, no mass, significant mass effect or hydrocephalous.  No acute displaced skull fractures are identified.  Visualized paranasal sinuses and mastoids are well pneumatized.  IMPRESSION: .  Evolving area of encephalomalacia in the posterior left cerebral hemisphere, as above, related to prior left PCA territory infarction. 2.  Smaller area of encephalomalacia in the posterior right parietal region as well.   Original Report Authenticated By: Trudie Reed, M.D.   Dg Chest Port 1 View  01/27/2013   *RADIOLOGY REPORT*  Clinical Data: Altered mental status and atrial fibrillation  PORTABLE CHEST - 1 VIEW  Comparison: Chest radiograph 11/18/2012  Findings: Cardiac leads project over the chest.  Heart size appears mildly enlarged and stable.  There is pulmonary vascular congestion.  No definite pulmonary edema.  Negative for pneumothorax or visible pleural effusion.  No acute osseous abnormality.  IMPRESSION: Cardiomegaly with pulmonary vascular congestion   Original Report Authenticated By: Britta Mccreedy, M.D.     1. Confusion   2. CVA (cerebral infarction)       MDM  Confusion, post- CVA. Mental status in the ED, is similar to her baseline at hospital discharge. She went from hospital discharge, to rehabilitation. In rehabilitation she progressed well. There is currently no  sign of toxic, metabolic or infectious processes. She can safely be managed in a out-patient setting.    Nursing Notes Reviewed/ Care Coordinated, and agree without changes. Applicable Imaging Reviewed.  Interpretation of Laboratory Data incorporated into ED treatment   Plan: Home Medications- usual; Home Treatments and Observation- continue rehab. treatments; return here if the recommended treatment, does not improve the symptoms; Recommended follow up- PCP tomorrow.      Flint Melter, MD 01/28/13 Marlyne Beards

## 2013-01-27 NOTE — ED Notes (Signed)
[facility] updated on patient status and plan of care. States "we'll just have to send her back". This nurse explained diagnosis and that per neurologist treatment will not be different. Changes due to old infarct and no need for hospitalization.

## 2013-01-27 NOTE — ED Notes (Signed)
Pt arrived by [facility] from [facility]. Pt was found by daughter around 1615 unresponsive. Pt is normally talking and pt is very weak and lethargic. EMS stated that facility gave medication for high BP. VS: BP- R arm 178/118 L arm 154/90. Pt currently in Afib and has a hx of afib. Daughter at bedside.

## 2013-01-27 NOTE — ED Notes (Signed)
Family updated on plan of care. Pt. To go back to [facility].

## 2013-01-27 NOTE — ED Notes (Signed)
Talked with patient's sisters about patient baseline status.

## 2013-01-28 ENCOUNTER — Emergency Department (HOSPITAL_COMMUNITY): Payer: PRIVATE HEALTH INSURANCE

## 2013-01-28 ENCOUNTER — Encounter (HOSPITAL_COMMUNITY): Payer: Self-pay

## 2013-01-28 ENCOUNTER — Inpatient Hospital Stay (HOSPITAL_COMMUNITY)
Admission: EM | Admit: 2013-01-28 | Discharge: 2013-02-02 | DRG: 064 | Disposition: A | Discharge status: Skilled Nursing Facility | Payer: PRIVATE HEALTH INSURANCE | Attending: Internal Medicine | Admitting: Internal Medicine

## 2013-01-28 ENCOUNTER — Inpatient Hospital Stay (HOSPITAL_COMMUNITY): Payer: PRIVATE HEALTH INSURANCE

## 2013-01-28 DIAGNOSIS — R51 Headache: Secondary | ICD-10-CM

## 2013-01-28 DIAGNOSIS — I639 Cerebral infarction, unspecified: Secondary | ICD-10-CM

## 2013-01-28 DIAGNOSIS — N289 Disorder of kidney and ureter, unspecified: Secondary | ICD-10-CM

## 2013-01-28 DIAGNOSIS — R4182 Altered mental status, unspecified: Secondary | ICD-10-CM

## 2013-01-28 DIAGNOSIS — E119 Type 2 diabetes mellitus without complications: Secondary | ICD-10-CM

## 2013-01-28 DIAGNOSIS — I1310 Hypertensive heart and chronic kidney disease without heart failure, with stage 1 through stage 4 chronic kidney disease, or unspecified chronic kidney disease: Secondary | ICD-10-CM

## 2013-01-28 DIAGNOSIS — N183 Chronic kidney disease, stage 3 unspecified: Secondary | ICD-10-CM

## 2013-01-28 DIAGNOSIS — E1129 Type 2 diabetes mellitus with other diabetic kidney complication: Secondary | ICD-10-CM | POA: Diagnosis present

## 2013-01-28 DIAGNOSIS — G609 Hereditary and idiopathic neuropathy, unspecified: Secondary | ICD-10-CM

## 2013-01-28 DIAGNOSIS — F32A Depression, unspecified: Secondary | ICD-10-CM

## 2013-01-28 DIAGNOSIS — N184 Chronic kidney disease, stage 4 (severe): Secondary | ICD-10-CM | POA: Diagnosis present

## 2013-01-28 DIAGNOSIS — F329 Major depressive disorder, single episode, unspecified: Secondary | ICD-10-CM

## 2013-01-28 DIAGNOSIS — I6350 Cerebral infarction due to unspecified occlusion or stenosis of unspecified cerebral artery: Principal | ICD-10-CM

## 2013-01-28 DIAGNOSIS — I4891 Unspecified atrial fibrillation: Secondary | ICD-10-CM

## 2013-01-28 DIAGNOSIS — F320 Major depressive disorder, single episode, mild: Secondary | ICD-10-CM | POA: Diagnosis present

## 2013-01-28 DIAGNOSIS — I10 Essential (primary) hypertension: Secondary | ICD-10-CM

## 2013-01-28 LAB — T3, FREE: T3, Free: 2.4 pg/mL (ref 2.3–4.2)

## 2013-01-28 LAB — URINE MICROSCOPIC-ADD ON

## 2013-01-28 LAB — URINALYSIS, ROUTINE W REFLEX MICROSCOPIC
Bilirubin Urine: NEGATIVE
Ketones, ur: 15 mg/dL — AB
Nitrite: NEGATIVE
Protein, ur: >300 mg/dL — AB
Urobilinogen, UA: 0.2 mg/dL (ref 0.0–1.0)

## 2013-01-28 LAB — COMPREHENSIVE METABOLIC PANEL
ALT: 17 U/L (ref 0–35)
Albumin: 3 g/dL — ABNORMAL LOW (ref 3.5–5.2)
Calcium: 9.6 mg/dL (ref 8.4–10.5)
GFR calc Af Amer: 52 mL/min — ABNORMAL LOW (ref >90)
Glucose, Bld: 173 mg/dL — ABNORMAL HIGH (ref 70–99)
Sodium: 137 mEq/L (ref 135–145)
Total Protein: 7.6 g/dL (ref 6.0–8.3)

## 2013-01-28 LAB — CBC WITH DIFFERENTIAL/PLATELET
Basophils Relative: 0 % (ref 0–1)
Eosinophils Absolute: 0.1 10*3/uL (ref 0.0–0.7)
Eosinophils Relative: 1 % (ref 0–5)
Lymphs Abs: 1.5 10*3/uL (ref 0.7–4.0)
MCH: 28.7 pg (ref 26.0–34.0)
MCHC: 33.6 g/dL (ref 30.0–36.0)
MCV: 85.4 fL (ref 78.0–100.0)
Platelets: 349 10*3/uL (ref 150–400)
RBC: 4.71 MIL/uL (ref 3.87–5.11)
RDW: 13.1 % (ref 11.5–15.5)

## 2013-01-28 LAB — GLUCOSE, CAPILLARY: Glucose-Capillary: 153 mg/dL — ABNORMAL HIGH (ref 70–99)

## 2013-01-28 LAB — RAPID URINE DRUG SCREEN, HOSP PERFORMED
Barbiturates: NOT DETECTED
Benzodiazepines: NOT DETECTED
Cocaine: NOT DETECTED

## 2013-01-28 LAB — URINE CULTURE

## 2013-01-28 MED ORDER — INSULIN ASPART 100 UNIT/ML ~~LOC~~ SOLN: 0.0000 [IU] | Freq: Every day | SUBCUTANEOUS | Status: DC

## 2013-01-28 MED ORDER — CLONIDINE HCL 0.1 MG PO TABS
0.1000 mg | ORAL_TABLET | Freq: Two times a day (BID) | ORAL | Status: DC
  Administered 2013-01-29 – 2013-02-01 (×7): 0.1 mg via ORAL
  Filled 2013-01-28 (×9): qty 1

## 2013-01-28 MED ORDER — INSULIN ASPART 100 UNIT/ML ~~LOC~~ SOLN
0.0000 [IU] | Freq: Three times a day (TID) | SUBCUTANEOUS | Status: DC
  Administered 2013-01-29 – 2013-01-31 (×4): 2 [IU] via SUBCUTANEOUS
  Administered 2013-01-31: 1 [IU] via SUBCUTANEOUS
  Administered 2013-01-31 – 2013-02-01 (×3): 2 [IU] via SUBCUTANEOUS
  Administered 2013-02-01: 1 [IU] via SUBCUTANEOUS
  Administered 2013-02-02 (×2): 2 [IU] via SUBCUTANEOUS

## 2013-01-28 MED ORDER — CLONIDINE HCL 0.2 MG PO TABS
0.2000 mg | ORAL_TABLET | Freq: Once | ORAL | Status: AC
  Administered 2013-01-28: 0.2 mg via ORAL
  Filled 2013-01-28: qty 1

## 2013-01-28 MED ORDER — CITALOPRAM HYDROBROMIDE 10 MG PO TABS
10.0000 mg | ORAL_TABLET | Freq: Every day | ORAL | Status: DC
  Administered 2013-01-29 – 2013-02-02 (×5): 10 mg via ORAL
  Filled 2013-01-28 (×6): qty 1

## 2013-01-28 MED ORDER — PREGABALIN 25 MG PO CAPS
25.0000 mg | ORAL_CAPSULE | Freq: Every day | ORAL | Status: DC
  Administered 2013-01-29: 25 mg via ORAL
  Filled 2013-01-28: qty 1

## 2013-01-28 MED ORDER — HYDRALAZINE HCL 20 MG/ML IJ SOLN
10.0000 mg | Freq: Three times a day (TID) | INTRAMUSCULAR | Status: DC | PRN
  Filled 2013-01-28: qty 1

## 2013-01-28 MED ORDER — HALOPERIDOL LACTATE 5 MG/ML IJ SOLN
0.5000 mg | Freq: Three times a day (TID) | INTRAMUSCULAR | Status: DC | PRN
  Administered 2013-01-28 – 2013-01-29 (×3): 0.5 mg via INTRAVENOUS
  Filled 2013-01-28 (×3): qty 1

## 2013-01-28 MED ORDER — ISOSORB DINITRATE-HYDRALAZINE 20-37.5 MG PO TABS
1.0000 | ORAL_TABLET | Freq: Two times a day (BID) | ORAL | Status: DC
  Administered 2013-01-29 – 2013-02-02 (×9): 1 via ORAL
  Filled 2013-01-28 (×11): qty 1

## 2013-01-28 MED ORDER — RIVAROXABAN 15 MG PO TABS
15.0000 mg | ORAL_TABLET | Freq: Every day | ORAL | Status: DC
  Administered 2013-01-29 – 2013-02-01 (×4): 15 mg via ORAL
  Filled 2013-01-28 (×6): qty 1

## 2013-01-28 MED ORDER — LORATADINE 10 MG PO TABS
10.0000 mg | ORAL_TABLET | Freq: Every day | ORAL | Status: DC
  Administered 2013-01-29 – 2013-02-02 (×5): 10 mg via ORAL
  Filled 2013-01-28 (×6): qty 1

## 2013-01-28 MED ORDER — METOPROLOL SUCCINATE ER 100 MG PO TB24
100.0000 mg | ORAL_TABLET | Freq: Every day | ORAL | Status: DC
  Administered 2013-01-29 – 2013-02-02 (×5): 100 mg via ORAL
  Filled 2013-01-28 (×6): qty 1

## 2013-01-28 MED ORDER — DILTIAZEM HCL ER COATED BEADS 360 MG PO CP24
360.0000 mg | ORAL_CAPSULE | Freq: Every day | ORAL | Status: DC
  Administered 2013-01-29 – 2013-01-30 (×2): 360 mg via ORAL
  Filled 2013-01-28 (×3): qty 1

## 2013-01-28 MED ORDER — POLYETHYLENE GLYCOL 3350 17 G PO PACK
17.0000 g | PACK | Freq: Every day | ORAL | Status: DC
  Administered 2013-01-31 – 2013-02-02 (×3): 17 g via ORAL
  Filled 2013-01-28 (×6): qty 1

## 2013-01-28 MED ORDER — ASPIRIN 81 MG PO CHEW
81.0000 mg | CHEWABLE_TABLET | Freq: Every day | ORAL | Status: DC
  Administered 2013-01-29 – 2013-02-02 (×5): 81 mg via ORAL
  Filled 2013-01-28 (×6): qty 1

## 2013-01-28 NOTE — ED Notes (Signed)
Seen here last night and discharged with no given diagnosis, now more confused and disoriented, complains of right thigh pain initially and now bilateral thigh pain. Per snf left side weakness, with ems equal strength,  cbg with ems 123. 224/152 bp, 100 % ra, nsr.

## 2013-01-28 NOTE — Progress Notes (Signed)
Pt failed swallow screen- unable to follow commands. Order placed for ST swallow eval.

## 2013-01-28 NOTE — Progress Notes (Signed)
Attempted in and out cath for urine specimen for UA & UDS order since she is incontinent. She became very agitated / restless and yelling out confused incomprehensible words. In and out cath was unsuccessful due to uncooperative. Received telephone order from Dr Gwenlyn Perking for Haldol PRN and a Recruitment consultant when one becomes available. Will continue to monitor.

## 2013-01-28 NOTE — ED Notes (Signed)
Patient transported to MRI 

## 2013-01-28 NOTE — Progress Notes (Signed)
Report received from ED RN Maximiano Coss.  Dr Gwenlyn Perking at bedside to place new orders. Pt placed on tele. Will continue to monitor. [name], [name]

## 2013-01-28 NOTE — ED Provider Notes (Signed)
History     CSN: [id number]  Arrival date & time 01/28/13  7829   First MD Initiated Contact with Patient 01/28/13 [id number]      Chief Complaint  Patient presents with  . Altered Mental Status    (Consider location/radiation/quality/duration/timing/severity/associated sxs/prior treatment) Patient is a 67 y.o. female presenting with altered mental status. The history is provided by the nursing home. The history is limited by the condition of the patient (Altered mental status).  Altered Mental Status She is sent from nursing home for evaluation of altered mental status and elevated blood pressure. She is currently going for rehabilitation following a stroke. She is not answering any questions and, at times, appears to be talking to people who aren't there.  Past Medical History  Diagnosis Date  . Hypertension   . Diabetes mellitus   . Glaucoma   . Cataract     History reviewed. No pertinent past surgical history.  Family History  Problem Relation Age of Onset  . Stroke Mother   . Diabetes type II Mother   . CAD Father     History  Substance Use Topics  . Smoking status: Never Smoker   . Smokeless tobacco: Not on file  . Alcohol Use: No    OB History   Grav Para Term Preterm Abortions TAB SAB Ect Mult Living                  Review of Systems  Unable to perform ROS: Mental status change  Psychiatric/Behavioral: Positive for altered mental status.    Allergies  Review of patient's allergies indicates no known allergies.  Home Medications   Current Outpatient Rx  Name  Route  Sig  Dispense  Refill  . aspirin 81 MG chewable tablet   Oral   Chew 1 tablet (81 mg total) by mouth daily.         . cetirizine (ZYRTEC) 10 MG tablet   Oral   Take 10 mg by mouth daily.         . citalopram (CELEXA) 10 MG tablet   Oral   Take 10 mg by mouth daily.         . cloNIDine (CATAPRES) 0.1 MG tablet   Oral   Take 0.1 mg by mouth 2 (two) times daily.         Marland Kitchen  diltiazem (CARDIZEM CD) 360 MG 24 hr capsule   Oral   Take 360 mg by mouth daily.         Marland Kitchen doxazosin (CARDURA) 1 MG tablet   Oral   Take 1 mg by mouth at bedtime.         . metoprolol succinate (TOPROL-XL) 100 MG 24 hr tablet   Oral   Take 100 mg by mouth daily. Take with or immediately following a meal.         . polyethylene glycol (MIRALAX / GLYCOLAX) packet   Oral   Take 17 g by mouth daily.         . Rivaroxaban (XARELTO) 15 MG TABS tablet   Oral   Take 1 tablet (15 mg total) by mouth daily with supper.   30 tablet      . traMADol (ULTRAM-ER) 100 MG 24 hr tablet   Oral   Take 100 mg by mouth every 8 (eight) hours as needed for pain.           BP 147/99  Pulse 86  Temp(Src) 98.1 F (36.7 C) (Oral)  Resp 20  SpO2 99%  Physical Exam  Nursing note and vitals reviewed.  67 year old female, resting comfortably and in no acute distress. Vital signs are significant for EKG with blood pressure 147/99, although, it he has reported blood pressure at 224/152. Oxygen saturation is 100%, which is normal. Head is normocephalic and atraumatic. PERRLA, EOMI. Oropharynx is clear. Neck is nontender and supple without adenopathy or JVD. Back is nontender and there is no CVA tenderness. Lungs are clear without rales, wheezes, or rhonchi. Chest is nontender. Heart has regular rate and rhythm without murmur. Abdomen is soft, flat, nontender without masses or hepatosplenomegaly and peristalsis is normoactive. Extremities have no cyanosis or edema, full range of motion is present. Skin is warm and dry without rash. Neurologic: She is awake and alert but does not answer questions. She does respond to stimuli such as complaining of pain when I palpate her legs and the complaining of feeling cold when I use my stethoscope to listen to her lungs. She will not answer simple questions such as what is your name., cranial nerves are intact, there are no gross motor or sensory  deficits.  ED Course  Procedures (including critical care time)  Results for orders placed during the hospital encounter of 01/28/13  CBC WITH DIFFERENTIAL      Result Value Range   WBC 6.9  4.0 - 10.5 K/uL   RBC 4.71  3.87 - 5.11 MIL/uL   Hemoglobin 13.5  12.0 - 15.0 g/dL   HCT 40.9  81.1 - 91.4 %   MCV 85.4  78.0 - 100.0 fL   MCH 28.7  26.0 - 34.0 pg   MCHC 33.6  30.0 - 36.0 g/dL   RDW 78.2  95.6 - 21.3 %   Platelets 349  150 - 400 K/uL   Neutrophils Relative % 65  43 - 77 %   Neutro Abs 4.5  1.7 - 7.7 K/uL   Lymphocytes Relative 22  12 - 46 %   Lymphs Abs 1.5  0.7 - 4.0 K/uL   Monocytes Relative 12  3 - 12 %   Monocytes Absolute 0.8  0.1 - 1.0 K/uL   Eosinophils Relative 1  0 - 5 %   Eosinophils Absolute 0.1  0.0 - 0.7 K/uL   Basophils Relative 0  0 - 1 %   Basophils Absolute 0.0  0.0 - 0.1 K/uL  COMPREHENSIVE METABOLIC PANEL      Result Value Range   Sodium 137  135 - 145 mEq/L   Potassium 3.7  3.5 - 5.1 mEq/L   Chloride 100  96 - 112 mEq/L   CO2 28  19 - 32 mEq/L   Glucose, Bld 173 (*) 70 - 99 mg/dL   BUN 18  6 - 23 mg/dL   Creatinine, Ser 0.86 (*) 0.50 - 1.10 mg/dL   Calcium 9.6  8.4 - 57.8 mg/dL   Total Protein 7.6  6.0 - 8.3 g/dL   Albumin 3.0 (*) 3.5 - 5.2 g/dL   AST 12  0 - 37 U/L   ALT 17  0 - 35 U/L   Alkaline Phosphatase 69  39 - 117 U/L   Total Bilirubin 0.5  0.3 - 1.2 mg/dL   GFR calc non Af Amer 44 (*) >90 mL/min   GFR calc Af Amer 52 (*) >90 mL/min   Ct Head Wo Contrast  01/28/2013   *RADIOLOGY REPORT*  Clinical Data: Altered mental status.  CT HEAD WITHOUT CONTRAST  Technique:  Contiguous axial images were obtained from the base of the skull through the vertex without contrast.  Comparison: 01/27/2013  Findings: Again noted is the area of evolving chronic infarct / encephalomalacia in the left PCA territory involving the left occipital lobe and posterior parietal lobe.  Less pronounced area of encephalomalacia within the posterior right parietal lobe,  stable.  Chronic microvascular changes throughout the white matter. Old lacunar infarcts in the thalami bilaterally.  No acute infarction.  No hemorrhage.  No hydrocephalus or extra-axial fluid collection.  No acute bony abnormality. Visualized paranasal sinuses and mastoids clear.  Orbital soft tissues unremarkable.  IMPRESSION: Stable appearance of the brain.  No acute findings.   Original Report Authenticated By: Charlett Nose, M.D.   Ct Head Wo Contrast  01/27/2013   *RADIOLOGY REPORT*  Clinical Data: Unresponsive.  Confused and combative.  CT HEAD WITHOUT CONTRAST  Technique:  Contiguous axial images were obtained from the base of the skull through the vertex without contrast.  Comparison: Head CTA 11/18/2012.  Findings: Evolving area of encephalomalacia in the left occipital lobe, posterior left parietal region and posterior left temporal region, compatible with old left PCA territory infarction.  There is also a small area of cortical atrophy and encephalomalacia in the posterior right parietal region which is similar to the prior study.  No definite acute intracranial abnormality.  Specifically, no definite signs of acute/subacute cerebral ischemia, no evidence of acute intracranial hemorrhage, no mass, significant mass effect or hydrocephalous.  No acute displaced skull fractures are identified.  Visualized paranasal sinuses and mastoids are well pneumatized.  IMPRESSION: .  Evolving area of encephalomalacia in the posterior left cerebral hemisphere, as above, related to prior left PCA territory infarction. 2.  Smaller area of encephalomalacia in the posterior right parietal region as well.   Original Report Authenticated By: Trudie Reed, M.D.   Dg Chest Portable 1 View  01/28/2013   *RADIOLOGY REPORT*  Clinical Data: Altered mental status, confusion  PORTABLE CHEST - 1 VIEW  Comparison: 01/27/2013  Findings: Cardiomegaly again noted.  Study is limited by poor inspiration.  Mild basilar atelectasis.   No segmental infiltrate or pulmonary edema.  IMPRESSION:   Limited study by poor inspiration.  Mild basilar atelectasis.  No segmental infiltrate or pulmonary edema.   Original Report Authenticated By: Natasha Mead, M.D.   Dg Chest Port 1 View  01/27/2013   *RADIOLOGY REPORT*  Clinical Data: Altered mental status and atrial fibrillation  PORTABLE CHEST - 1 VIEW  Comparison: Chest radiograph 11/18/2012  Findings: Cardiac leads project over the chest.  Heart size appears mildly enlarged and stable.  There is pulmonary vascular congestion.  No definite pulmonary edema.  Negative for pneumothorax or visible pleural effusion.  No acute osseous abnormality.  IMPRESSION: Cardiomegaly with pulmonary vascular congestion   Original Report Authenticated By: Britta Mccreedy, M.D.       1. Altered mental status   2. Renal insufficiency   3. Hypertension       MDM  Altered mental status.  Hypertension. Old records are reviewed and she was seen in the emergency department yesterday for altered mental status. I could not see any description of how her mental status was altered yesterday so I cannot tell if this is significantly different. However, she reportedly was having difficulty when seeing an orthopedic surgeon about possible hip surgery her. Today, she does not answer any questions directly so it seems that this is a significant change. Family is not present currently,  but if they arrive, and they will be asked to her current mentation compares with her baseline since her stroke.  9:49 AM Family has arrived and state that the patient's mental status had changed out rather abruptly yesterday. She was normal when she went to the orthopedic doctor's office and then stopped talking to people in responding and actually completely shut down and became nonverbal. Condition today is similar to yesterday but she is more verbal. Prior to yesterday, she was able to converse with people. She also has not had any of her  medications since yesterday. Pressure has gone up over 200 systolic and she will be given a dose of clonidine.  12:16 PM Blood pressure has come down to once 162/93 which is acceptable. Workup shows actual improvement in baseline renal insufficiency and no other explanation for her mental status change. Case is discussed with Dr. Gwenlyn Perking of [facility] who agrees to admit the patient. He requests an MRI scan be obtained and this is ordered.  Dione Booze, MD 01/28/13 [id number]

## 2013-01-28 NOTE — Consult Note (Addendum)
Referring Physician: [place]    Chief Complaint: AMS  HPI: [name] is an 67 y.o. female who was admitted in April of this year with afib and RVR and diagnosed with an acute left PCA infarct.  Patient has been discharged and receiving therapy at home.  Recently discharged from services due to nonprogression.   Was able to hold a conversation and feed herself.  On yesterday while at the doctors office had an acute altered mental status.  Initially she became cold then somewhat confused.  After that she did not speak at all for a period of time.  She seemed to come back to baseline later in the evening and was unaware of the events earlier in the day.  This improved mental status was not prolonged and she again developed an AMS. She was brought to the ED last evening and a head CT was performed.  It has been reviewed.  It showed no acute changes and the patient was sent back to her facility.  She was no better today and they sent her back here.   A MRI of the brain was performed and reviewed.   It shows an acute small area of infarction adjacent to her known left occipital infarct.   Patient is on Xarelto and ASA.  Date last known well: Date: 01/27/2013 Time last known well: Time: 09:45 tPA Given: No: Recent infarct, outside time window  Past Medical History  Diagnosis Date  . Hypertension   . Diabetes mellitus   . Glaucoma   . Cataract     History reviewed. No pertinent past surgical history.  Family History  Problem Relation Age of Onset  . Stroke Mother   . Diabetes type II Mother   . CAD Father    Social History:  reports that she has never smoked. She does not have any smokeless tobacco history on file. She reports that she does not drink alcohol or use illicit drugs.  Allergies: No Known Allergies  Medications:  I have reviewed the patient's current medications. Prior to Admission:  Prescriptions prior to admission  Medication Sig Dispense Refill  . aspirin 81 MG chewable  tablet Chew 1 tablet (81 mg total) by mouth daily.      . cetirizine (ZYRTEC) 10 MG tablet Take 10 mg by mouth daily.      . citalopram (CELEXA) 10 MG tablet Take 10 mg by mouth daily.      . cloNIDine (CATAPRES) 0.1 MG tablet Take 0.1 mg by mouth 2 (two) times daily.      Marland Kitchen diltiazem (CARDIZEM CD) 360 MG 24 hr capsule Take 360 mg by mouth daily.      Marland Kitchen doxazosin (CARDURA) 1 MG tablet Take 1 mg by mouth at bedtime.      . Magnesium Hydroxide (MILK OF MAGNESIA PO) Take 30 mLs by mouth daily as needed (for constipation). Give if no BM in 3 days      . metoprolol succinate (TOPROL-XL) 100 MG 24 hr tablet Take 100 mg by mouth daily. Take with or immediately following a meal.      . polyethylene glycol (MIRALAX / GLYCOLAX) packet Take 17 g by mouth daily.      . Rivaroxaban (XARELTO) 15 MG TABS tablet Take 1 tablet (15 mg total) by mouth daily with supper.  30 tablet    . traMADol (ULTRAM-ER) 100 MG 24 hr tablet Take 100 mg by mouth every 8 (eight) hours as needed for pain.  Scheduled: . aspirin  81 mg Oral Daily  . citalopram  10 mg Oral Daily  . cloNIDine  0.1 mg Oral BID  . diltiazem  360 mg Oral Daily  . insulin aspart  0-5 Units Subcutaneous QHS  . insulin aspart  0-9 Units Subcutaneous TID WC  . isosorbide-hydrALAZINE  1 tablet Oral BID  . loratadine  10 mg Oral Daily  . [START ON 01/29/2013] metoprolol succinate  100 mg Oral Q breakfast  . polyethylene glycol  17 g Oral Daily  . pregabalin  25 mg Oral Daily  . Rivaroxaban  15 mg Oral Q supper    ROS: History obtained from sister  General ROS: negative for - chills, fatigue, fever, night sweats, weight gain or weight loss Psychological ROS: negative for - behavioral disorder, hallucinations, memory difficulties, mood swings or suicidal ideation Ophthalmic ROS: negative for - blurry vision, double vision, eye pain or loss of vision ENT ROS: negative for - epistaxis, nasal discharge, oral lesions, sore throat, tinnitus or  vertigo Allergy and Immunology ROS: negative for - hives or itchy/watery eyes Hematological and Lymphatic ROS: negative for - bleeding problems, bruising or swollen lymph nodes Endocrine ROS: negative for - galactorrhea, hair pattern changes, polydipsia/polyuria or temperature intolerance Respiratory ROS: negative for - cough, hemoptysis, shortness of breath or wheezing Cardiovascular ROS: negative for - chest pain, dyspnea on exertion, edema or irregular heartbeat Gastrointestinal ROS: negative for - abdominal pain, diarrhea, hematemesis, nausea/vomiting or stool incontinence Genito-Urinary ROS: negative for - dysuria, hematuria, incontinence or urinary frequency/urgency Musculoskeletal ROS: hip pain Neurological ROS: as noted in HPI Dermatological ROS: negative for rash and skin lesion changes  Physical Examination: Blood pressure 176/111, pulse 90, temperature 98 F (36.7 C), temperature source Oral, resp. rate 18, height 5\' 4"  (1.626 m), weight 101.1 kg (222 lb 14.2 oz), SpO2 94.00%.  Neurologic Examination: Mental Status: Alert.  Talking to people that are not there.  At times speech is nonsense and unintelligible.  Does not follow commands. Cranial Nerves: II: Discs flat bilaterally; Does not blink to confrontation from the left, pupils equal, round, reactive to light and accommodation III,IV, VI: ptosis not present, right eye deviation V,VII: right facial droop, facial light touch sensation normal bilaterally VIII: hearing normal bilaterally IX,X: gag reflex present XI: bilateral shoulder shrug XII: tongue extension unable to test Motor: Raises both arms easily off the bed.  Raises legs off the bed but does not cooperate for formal testing Sensory: Responds overwhelmingly to light stimuli bilaterally Deep Tendon Reflexes: 2+ in the upper extremities and absent in the lower extremities.   Plantars: Right: withdrawal   Left: withdrawal Cerebellar: Does not cooperate to  perform Gait: Unable to test CV: pulses palpable throughout     Laboratory Studies:  Basic Metabolic Panel:  Recent Labs Lab 01/28/13 1110  NA 137  K 3.7  CL 100  CO2 28  GLUCOSE 173*  BUN 18  CREATININE 1.25*  CALCIUM 9.6    Liver Function Tests:  Recent Labs Lab 01/28/13 1110  AST 12  ALT 17  ALKPHOS 69  BILITOT 0.5  PROT 7.6  ALBUMIN 3.0*   No results found for this basename: LIPASE, AMYLASE,  in the last 168 hours No results found for this basename: AMMONIA,  in the last 168 hours  CBC:  Recent Labs Lab 01/27/13 1918 01/28/13 1110  WBC 7.0 6.9  NEUTROABS 5.7 4.5  HGB 13.8 13.5  HCT 41.4 40.2  MCV 85.9 85.4  PLT 353 349  Cardiac Enzymes: No results found for this basename: CKTOTAL, CKMB, CKMBINDEX, TROPONINI,  in the last 168 hours  BNP: No components found with this basename: POCBNP,   CBG:  Recent Labs Lab 01/27/13 1902  GLUCAP 187*    Microbiology: Results for orders placed during the hospital encounter of 11/17/12  MRSA PCR SCREENING     Status: None   Collection Time    11/18/12  4:54 AM      Result Value Range Status   MRSA by PCR NEGATIVE  NEGATIVE Final   Comment:            The GeneXpert MRSA Assay (FDA     approved for NASAL specimens     only), is one component of a     comprehensive MRSA colonization     surveillance program. It is not     intended to diagnose MRSA     infection nor to guide or     monitor treatment for     MRSA infections.    Coagulation Studies: No results found for this basename: LABPROT, INR,  in the last 72 hours  Urinalysis:  Recent Labs Lab 01/27/13 1845  COLORURINE YELLOW  LABSPEC 1.019  PHURINE 6.5  GLUCOSEU 100*  HGBUR MODERATE*  BILIRUBINUR NEGATIVE  KETONESUR 15*  PROTEINUR >300*  UROBILINOGEN 0.2  NITRITE NEGATIVE  LEUKOCYTESUR NEGATIVE    Lipid Panel:    Component Value Date/Time   CHOL 122 11/19/2012 0535   TRIG 48 11/19/2012 0535   HDL 72 11/19/2012 0535    CHOLHDL 1.7 11/19/2012 0535   VLDL 10 11/19/2012 0535   LDLCALC 40 11/19/2012 0535    HgbA1C:  Lab Results  Component Value Date   HGBA1C 6.1* 11/18/2012    Urine Drug Screen:     Component Value Date/Time   LABOPIA NONE DETECTED 11/18/2012 0345   COCAINSCRNUR NONE DETECTED 11/18/2012 0345   LABBENZ NONE DETECTED 11/18/2012 0345   AMPHETMU NONE DETECTED 11/18/2012 0345   THCU NONE DETECTED 11/18/2012 0345   LABBARB NONE DETECTED 11/18/2012 0345    Alcohol Level: No results found for this basename: ETH,  in the last 168 hours  Other results: WUJ:WJXB  Imaging: Ct Head Wo Contrast  01/28/2013   *RADIOLOGY REPORT*  Clinical Data: Altered mental status.  CT HEAD WITHOUT CONTRAST  Technique:  Contiguous axial images were obtained from the base of the skull through the vertex without contrast.  Comparison: 01/27/2013  Findings: Again noted is the area of evolving chronic infarct / encephalomalacia in the left PCA territory involving the left occipital lobe and posterior parietal lobe.  Less pronounced area of encephalomalacia within the posterior right parietal lobe, stable.  Chronic microvascular changes throughout the white matter. Old lacunar infarcts in the thalami bilaterally.  No acute infarction.  No hemorrhage.  No hydrocephalus or extra-axial fluid collection.  No acute bony abnormality. Visualized paranasal sinuses and mastoids clear.  Orbital soft tissues unremarkable.  IMPRESSION: Stable appearance of the brain.  No acute findings.   Original Report Authenticated By: Charlett Nose, M.D.   Ct Head Wo Contrast  01/27/2013   *RADIOLOGY REPORT*  Clinical Data: Unresponsive.  Confused and combative.  CT HEAD WITHOUT CONTRAST  Technique:  Contiguous axial images were obtained from the base of the skull through the vertex without contrast.  Comparison: Head CTA 11/18/2012.  Findings: Evolving area of encephalomalacia in the left occipital lobe, posterior left parietal region and posterior left  temporal region, compatible with old left  PCA territory infarction.  There is also a small area of cortical atrophy and encephalomalacia in the posterior right parietal region which is similar to the prior study.  No definite acute intracranial abnormality.  Specifically, no definite signs of acute/subacute cerebral ischemia, no evidence of acute intracranial hemorrhage, no mass, significant mass effect or hydrocephalous.  No acute displaced skull fractures are identified.  Visualized paranasal sinuses and mastoids are well pneumatized.  IMPRESSION: .  Evolving area of encephalomalacia in the posterior left cerebral hemisphere, as above, related to prior left PCA territory infarction. 2.  Smaller area of encephalomalacia in the posterior right parietal region as well.   Original Report Authenticated By: Trudie Reed, M.D.   Mr Brain Wo Contrast  01/28/2013   *RADIOLOGY REPORT*  Clinical Data: History of prior stroke, now with increasing confusion. Stroke risk factors include hypertension, prior stroke, chronic renal disease, and diabetes.  MRI HEAD WITHOUT CONTRAST  Technique:  Multiplanar, multiecho pulse sequences of the brain and surrounding structures were obtained according to standard protocol without intravenous contrast.  Comparison: CT head 01/28/2013.  MR head 11/18/2012.  Findings: A large area of subacute to chronic infarction affects the left posterior temporal and occipital lobes.  This is partially hemorrhagic, with areas of subacute to chronic blood products reflecting typical reperfusion seen in infarction affecting this vascular territory. This infarct was acute 11/18/2012.  Along the lateral margin of the occipital horn of the left lateral ventricle, there are subcentimeter foci of acute infarction superimposed. These are new from April 2010 and may reflect the patient's acute mental status change.  There is moderate atrophy.  There is extensive chronic microvascular ischemic change.   Numerous microbleeds are seen throughout the brainstem, cerebellum, and cerebral hemispheres consistent with hypertensive cerebrovascular disease sequelae.  Flow voids are maintained in the internal carotid arteries and basilar artery.  The major dural venous sinuses are patent.  There is no pituitary or cerebellar tonsillar abnormality.  Upper cervical region unremarkable.  Calvarium and skull base are intact.  Negative orbits.  No acute sinus or mastoid disease.  IMPRESSION: Small subcentimeter foci of acute infarction in the left occipital lobe superimposed on a large chronic left PCA territory infarction as described above.  Multiple foci of subacute to chronic hemorrhage related to prior infarction and hypertensive cerebrovascular disease.  Atrophy with moderate to severe chronic microvascular ischemic change.   Original Report Authenticated By: Davonna Belling, M.D.   Dg Chest Portable 1 View  01/28/2013   *RADIOLOGY REPORT*  Clinical Data: Altered mental status, confusion  PORTABLE CHEST - 1 VIEW  Comparison: 01/27/2013  Findings: Cardiomegaly again noted.  Study is limited by poor inspiration.  Mild basilar atelectasis.  No segmental infiltrate or pulmonary edema.  IMPRESSION:   Limited study by poor inspiration.  Mild basilar atelectasis.  No segmental infiltrate or pulmonary edema.   Original Report Authenticated By: Natasha Mead, M.D.   Dg Chest Port 1 View  01/27/2013   *RADIOLOGY REPORT*  Clinical Data: Altered mental status and atrial fibrillation  PORTABLE CHEST - 1 VIEW  Comparison: Chest radiograph 11/18/2012  Findings: Cardiac leads project over the chest.  Heart size appears mildly enlarged and stable.  There is pulmonary vascular congestion.  No definite pulmonary edema.  Negative for pneumothorax or visible pleural effusion.  No acute osseous abnormality.  IMPRESSION: Cardiomegaly with pulmonary vascular congestion   Original Report Authenticated By: Britta Mccreedy, M.D.    Assessment: 67 y.o.  female with a history of afib  and recent infarct on Xarelto and ASA.  Had an acute change in mental status on yesterday.  Small area on infarct on MRI noted today but this seems to small to have caused such a dramatic change.  Patient does have eye deviation and altered mental status.  Has had some intermittent clear periods.  Can not rule out seizure.    Stroke Risk Factors - atrial fibrillation, diabetes mellitus, hypertension and recent infarct  Plan: 1. Patient with recent stroke work up.  Would not repeat at this time.   2. Prophylactic therapy-to remain on ASA and Xarelto 3. EEG 4. Telemetry monitoring 5. Frequent neuro checks  Case discussed with Dr. Sonny Dandy, MD [facility] [phone] 01/28/2013, 5:02 PM

## 2013-01-28 NOTE — ED Notes (Signed)
Tamsen Meek (sister) Cell- [phone]  Home- [phone]

## 2013-01-28 NOTE — H&P (Signed)
[facility] History and Physical  [name] ZOX:[id number] DOB: 02-Apr-1946 DOA: 01/28/2013  Referring physician: Dr. Preston Fleeting PCP: [name], MD   Chief Complaint: AMS  HPI: [name] is a 67 y.o. female with pmh of HTN, DM, CKD stage 3, atrial fibrillation (on xarelto) and previous stroke; came to ED from SNF due to acute onset of AMS.  History is retrieved from ED records, patient family mmembers at bedside and nursing home. Patient sent from nursing home for evaluation of altered mental status and elevated blood pressure. She is currently going through rehabilitation following a stroke and acutely stopped answering any questions and, at times, appears to be talking to people who aren't present in the room. No history of CP, cough, fever, abdominal pain, HA's, nausea, vomiting or any other acute complaints. Per records and family members no new medications has been added to her regimen. In the ED CT, CXR and initial blood work unrevealing for abnormalities. [facility] called to admit patient for further evaluation and treatment.   Review of Systems:  Unable to assess given patient AMS.  Past Medical History  Diagnosis Date  . Hypertension   . Diabetes mellitus   . Glaucoma   . Cataract    Social History:  reports that she has never smoked. She does not have any smokeless tobacco history on file. She reports that she does not drink alcohol or use illicit drugs. came from SNF.   No Known Allergies  Family History  Problem Relation Age of Onset  . Stroke Mother   . Diabetes type II Mother   . CAD Father     Prior to Admission medications   Medication Sig Start Date End Date Taking? Authorizing Provider  aspirin 81 MG chewable tablet Chew 1 tablet (81 mg total) by mouth daily. 11/24/12  Yes Joseph Art, DO  cetirizine (ZYRTEC) 10 MG tablet Take 10 mg by mouth daily.   Yes Historical Provider, MD  citalopram (CELEXA) 10 MG tablet Take 10 mg by mouth daily.   Yes  Historical Provider, MD  cloNIDine (CATAPRES) 0.1 MG tablet Take 0.1 mg by mouth 2 (two) times daily.   Yes Historical Provider, MD  diltiazem (CARDIZEM CD) 360 MG 24 hr capsule Take 360 mg by mouth daily.   Yes Historical Provider, MD  doxazosin (CARDURA) 1 MG tablet Take 1 mg by mouth at bedtime.   Yes Historical Provider, MD  Magnesium Hydroxide (MILK OF MAGNESIA PO) Take 30 mLs by mouth daily as needed (for constipation). Give if no BM in 3 days   Yes Historical Provider, MD  metoprolol succinate (TOPROL-XL) 100 MG 24 hr tablet Take 100 mg by mouth daily. Take with or immediately following a meal.   Yes Historical Provider, MD  polyethylene glycol (MIRALAX / GLYCOLAX) packet Take 17 g by mouth daily.   Yes Historical Provider, MD  Rivaroxaban (XARELTO) 15 MG TABS tablet Take 1 tablet (15 mg total) by mouth daily with supper. 11/24/12  Yes Joseph Art, DO  traMADol (ULTRAM-ER) 100 MG 24 hr tablet Take 100 mg by mouth every 8 (eight) hours as needed for pain.   Yes Historical Provider, MD   Physical Exam: Filed Vitals:   01/28/13 1200 01/28/13 1215 01/28/13 1230 01/28/13 1351  BP: 162/93 162/92 164/101 176/111  Pulse: 75 82 78 90  Temp:    98 F (36.7 C)  TempSrc:    Oral  Resp:    18  Height:    5'  4" (1.626 m)  Weight:    101.1 kg (222 lb 14.2 oz)  SpO2: 97% 97% 98% 94%     General:  Afebrile, Awake, unable to follow commands, intermittently good articulation but not sense of content in conversation.  Eyes: PERRL, no icterus, no nystagmus; not following commands to assess EOM activity  ENT: moist MM, no erythema, exudates or thrush inside her mouth  Neck: supple, no JVD, no thyromegaly, no bruits  Cardiovascular: rate controlled, no rubs or gallops  Respiratory: CTA bilaterally  Abdomen: soft, NT, ND, positive BS  Skin: no rash or petechiae  Musculoskeletal: trace edema bilaterally, no joint swelling  Psychiatric: unable to assess  Neurologic: awake, not oriented or  able to engage in conversation, move four limbs, and complaints of pain while touching her legs.  Labs on Admission:  Basic Metabolic Panel:  Recent Labs Lab 01/28/13 1110  NA 137  K 3.7  CL 100  CO2 28  GLUCOSE 173*  BUN 18  CREATININE 1.25*  CALCIUM 9.6   Liver Function Tests:  Recent Labs Lab 01/28/13 1110  AST 12  ALT 17  ALKPHOS 69  BILITOT 0.5  PROT 7.6  ALBUMIN 3.0*   CBC:  Recent Labs Lab 01/27/13 1918 01/28/13 1110  WBC 7.0 6.9  NEUTROABS 5.7 4.5  HGB 13.8 13.5  HCT 41.4 40.2  MCV 85.9 85.4  PLT 353 349   CBG:  Recent Labs Lab 01/27/13 1902  GLUCAP 187*    Radiological Exams on Admission: Ct Head Wo Contrast  01/28/2013   *RADIOLOGY REPORT*  Clinical Data: Altered mental status.  CT HEAD WITHOUT CONTRAST  Technique:  Contiguous axial images were obtained from the base of the skull through the vertex without contrast.  Comparison: 01/27/2013  Findings: Again noted is the area of evolving chronic infarct / encephalomalacia in the left PCA territory involving the left occipital lobe and posterior parietal lobe.  Less pronounced area of encephalomalacia within the posterior right parietal lobe, stable.  Chronic microvascular changes throughout the white matter. Old lacunar infarcts in the thalami bilaterally.  No acute infarction.  No hemorrhage.  No hydrocephalus or extra-axial fluid collection.  No acute bony abnormality. Visualized paranasal sinuses and mastoids clear.  Orbital soft tissues unremarkable.  IMPRESSION: Stable appearance of the brain.  No acute findings.   Original Report Authenticated By: Charlett Nose, M.D.   Ct Head Wo Contrast  01/27/2013   *RADIOLOGY REPORT*  Clinical Data: Unresponsive.  Confused and combative.  CT HEAD WITHOUT CONTRAST  Technique:  Contiguous axial images were obtained from the base of the skull through the vertex without contrast.  Comparison: Head CTA 11/18/2012.  Findings: Evolving area of encephalomalacia in the  left occipital lobe, posterior left parietal region and posterior left temporal region, compatible with old left PCA territory infarction.  There is also a small area of cortical atrophy and encephalomalacia in the posterior right parietal region which is similar to the prior study.  No definite acute intracranial abnormality.  Specifically, no definite signs of acute/subacute cerebral ischemia, no evidence of acute intracranial hemorrhage, no mass, significant mass effect or hydrocephalous.  No acute displaced skull fractures are identified.  Visualized paranasal sinuses and mastoids are well pneumatized.  IMPRESSION: .  Evolving area of encephalomalacia in the posterior left cerebral hemisphere, as above, related to prior left PCA territory infarction. 2.  Smaller area of encephalomalacia in the posterior right parietal region as well.   Original Report Authenticated By: Trudie Reed, M.D.  Mr Brain Wo Contrast  01/28/2013   *RADIOLOGY REPORT*  Clinical Data: History of prior stroke, now with increasing confusion. Stroke risk factors include hypertension, prior stroke, chronic renal disease, and diabetes.  MRI HEAD WITHOUT CONTRAST  Technique:  Multiplanar, multiecho pulse sequences of the brain and surrounding structures were obtained according to standard protocol without intravenous contrast.  Comparison: CT head 01/28/2013.  MR head 11/18/2012.  Findings: A large area of subacute to chronic infarction affects the left posterior temporal and occipital lobes.  This is partially hemorrhagic, with areas of subacute to chronic blood products reflecting typical reperfusion seen in infarction affecting this vascular territory. This infarct was acute 11/18/2012.  Along the lateral margin of the occipital horn of the left lateral ventricle, there are subcentimeter foci of acute infarction superimposed. These are new from April 2010 and may reflect the patient's acute mental status change.  There is moderate  atrophy.  There is extensive chronic microvascular ischemic change.  Numerous microbleeds are seen throughout the brainstem, cerebellum, and cerebral hemispheres consistent with hypertensive cerebrovascular disease sequelae.  Flow voids are maintained in the internal carotid arteries and basilar artery.  The major dural venous sinuses are patent.  There is no pituitary or cerebellar tonsillar abnormality.  Upper cervical region unremarkable.  Calvarium and skull base are intact.  Negative orbits.  No acute sinus or mastoid disease.  IMPRESSION: Small subcentimeter foci of acute infarction in the left occipital lobe superimposed on a large chronic left PCA territory infarction as described above.  Multiple foci of subacute to chronic hemorrhage related to prior infarction and hypertensive cerebrovascular disease.  Atrophy with moderate to severe chronic microvascular ischemic change.   Original Report Authenticated By: Davonna Belling, M.D.   Dg Chest Portable 1 View  01/28/2013   *RADIOLOGY REPORT*  Clinical Data: Altered mental status, confusion  PORTABLE CHEST - 1 VIEW  Comparison: 01/27/2013  Findings: Cardiomegaly again noted.  Study is limited by poor inspiration.  Mild basilar atelectasis.  No segmental infiltrate or pulmonary edema.  IMPRESSION:   Limited study by poor inspiration.  Mild basilar atelectasis.  No segmental infiltrate or pulmonary edema.   Original Report Authenticated By: Natasha Mead, M.D.   Dg Chest Port 1 View  01/27/2013   *RADIOLOGY REPORT*  Clinical Data: Altered mental status and atrial fibrillation  PORTABLE CHEST - 1 VIEW  Comparison: Chest radiograph 11/18/2012  Findings: Cardiac leads project over the chest.  Heart size appears mildly enlarged and stable.  There is pulmonary vascular congestion.  No definite pulmonary edema.  Negative for pneumothorax or visible pleural effusion.  No acute osseous abnormality.  IMPRESSION: Cardiomegaly with pulmonary vascular congestion   Original  Report Authenticated By: Britta Mccreedy, M.D.    Assessment/Plan 1-encephalopathy/Altered mental status: concerns for accelerated HTN causing problems and also new TIA/stroke. Also concerns for vascular dementia. Per family patient with moments of absence/staring and no response; then more confuse; ?? Seizure. -MRI ordered before examine patient suggested new superimposed area of stroke on previous stroke. Also with multiple foci of subacute to chronic hemorrhage related to prior infarction and hypertensive cerebrovascular disease. -will admit to telemetry -Neurology has been consulted -will check TSH, B12 and EEG -will check A1C and lipid profile -will check UA/urine cx -no antibiotics at this moment, as patient is no febrile and do not have elevated WBC's -will continue xarelto and ASA  2-CKD (chronic kidney disease) stage 3, GFR 30-59 ml/min: stable and at baseline. Will monitor.  3-Diabetes mellitus, controlled:  will use SSI.  4-Acute cerebral infarction/Large acute left PCA infarct & Small acute right PCA cortical infarct involving the occipital area: stable/with new infarcts as senn on repeat MRI. Continue treatment as mention above and risk factors modifications.  5-accelerated Hypertension: uncontrolled. Will adjust medications for better control. Will keep in mind no drastic drop given new CVA process.  6-Unspecified hereditary and idiopathic peripheral neuropathy: will use low dose lyrica  7-Depression: continue celexa.  DVT: on xarelto   Neurology (Dr. Thad Ranger consulted)  Code Status: DNR Family Communication: sisters at bedside Disposition Plan: inpatient, telemetry LOS > 2 midnights  Time spent: 55 minutes  Layne Dilauro [facility] Pager [phone]  If 7PM-7AM, please contact night-coverage [email or web address] Password [facility] 01/28/2013, 3:55 PM

## 2013-01-29 ENCOUNTER — Inpatient Hospital Stay (HOSPITAL_COMMUNITY): Payer: PRIVATE HEALTH INSURANCE

## 2013-01-29 ENCOUNTER — Other Ambulatory Visit (HOSPITAL_COMMUNITY): Payer: PRIVATE HEALTH INSURANCE

## 2013-01-29 LAB — LIPID PANEL
HDL: 74 mg/dL (ref >39)
Total CHOL/HDL Ratio: 1.6 RATIO
Triglycerides: 71 mg/dL (ref <150)

## 2013-01-29 LAB — HEMOGLOBIN A1C: Hgb A1c MFr Bld: 7.4 % — ABNORMAL HIGH (ref <5.7)

## 2013-01-29 LAB — GLUCOSE, CAPILLARY

## 2013-01-29 LAB — AMMONIA: Ammonia: 18 umol/L (ref 11–60)

## 2013-01-29 MED ORDER — SODIUM CHLORIDE 0.9 % IV SOLN
INTRAVENOUS | Status: DC
  Administered 2013-01-29 – 2013-02-01 (×3): via INTRAVENOUS

## 2013-01-29 MED ORDER — PREGABALIN 25 MG PO CAPS
25.0000 mg | ORAL_CAPSULE | Freq: Two times a day (BID) | ORAL | Status: DC
  Administered 2013-01-29 – 2013-02-02 (×8): 25 mg via ORAL
  Filled 2013-01-29 (×8): qty 1

## 2013-01-29 NOTE — Procedures (Signed)
REFERRING PHYSICIAN:  [name], MD.  HISTORY:  A 67 year old female with altered mental status.  MEDICATIONS:  Aspirin, Celexa, Catapres, Cardizem, Haldol, Apresoline, NovoLog, BiDil, Claritin, Toprol, MiraLAX, Lyrica, Xarelto.  CONDITIONS OF RECORDING:  This is a 16-channel EEG carried out with the patient in the drowsy and asleep state.  DESCRIPTION:  The background activity was slow with a polymorphic delta activity seen persistently throughout the tracing.  There was a notable absence of faster rhythms.  No evidence of stage II sleep was seen. Hyperventilation and intermittent photic stimulation were not performed.  IMPRESSION:  This EEG was characterized by general background slowing. Although, this can be seen with normal drowsiness, would be concerned for a diffuse disturbance that is etiologically nonspecific, but may include a metabolic encephalopathy among other possibilities.  No epileptiform activity was noted.          ______________________________ [name], MD    [id number] D:  01/29/2013 18:38:26  T:  01/29/2013 19:19:37  Job #:  [id number]

## 2013-01-29 NOTE — Progress Notes (Signed)
EEG Completed; Results Pending  

## 2013-01-29 NOTE — Progress Notes (Signed)
Pt is agitated again. Heart rate up to 190 but back down. Pt has been up and back down to close to normal all night. Heart rate increases as she sits up and starts moving around and then down when she starts to relax. Notified MD. Will continue to monitor.

## 2013-01-29 NOTE — Progress Notes (Signed)
[facility] PROGRESS NOTE  [name] EAV:[id number] DOB: 08-Sep-1945 DOA: 01/28/2013 PCP: [name], MD  Assessment/Plan: 1. CVA; per neurology's recommendation Continue aspirin 81 mg orally every day and Xarelto for secondary stroke prevention. Risk factor modification,Check ammonia level Increase Lyrica to twice a day. We'll also DC any benzodiazepines. Patient will need to return to SNF for extensive rehabilitation. The patient passed a swallow test and diet was advanced to dysphagia 1 (puree) 2. Diabetes; controlled using SSI 3. Hypertension; controlled on current regimen   Code Status: DO NOT RESUSCITATE Family Communication: None available Disposition Plan: Per neurology's recommendations   Consultants: Neurology (Dr. Thad Ranger), speech Procedures:  EEG  Antibiotics:  None  HPI/Subjective: [name] is a 67 y.o. BF PMHx HTN, DM, CKD stage 3, atrial fibrillation (on xarelto) and previous stroke; came to ED from SNF due to acute onset of AMS. History is retrieved from ED records, patient family members at bedside and nursing home. Patient sent from nursing home for evaluation of altered mental status and elevated blood pressure. She is currently going through rehabilitation following a stroke and acutely stopped answering any questions and, at times, appears to be talking to people who aren't present in the room. No history of CP, cough, fever, abdominal pain, HA's, nausea, vomiting or any other acute complaints. TODAY patient alert and oriented x1 (to person). Patient will intermittently follow commands. Patient unable to fully articulate her needs.   Objective: Filed Vitals:   01/29/13 0854 01/29/13 1411 01/29/13 1640 01/29/13 2159  BP: 142/78 164/99 148/82 166/88  Pulse:  85 107   Temp:  97.1 F (36.2 C)    TempSrc:  Axillary    Resp:  18    Height:      Weight:      SpO2:  99% 98%     Intake/Output Summary (Last 24 hours) at 01/29/13 2229 Last data  filed at 01/29/13 1630  Gross per 24 hour  Intake    120 ml  Output      0 ml  Net    120 ml   Filed Weights   01/28/13 1351  Weight: 101.1 kg (222 lb 14.2 oz)    Exam:   General:  Patient lethargic and unable to cooperate in exam until later in the day.  Cardiovascular: Regular rhythm and rate, negative murmurs rubs or gallops, DP/PT pulses 2+  Respiratory: Clear to auscultation bilaterally  Abdomen: Soft nontender nondistended plus bowel sounds  Neurologic alert and oriented x1 (person), unable to complete a pleural exam secondary to patient inability to follow detail structures. Patient did move all extremities, and would reach out to grab objects of interest.   Data Reviewed: Basic Metabolic Panel:  Recent Labs Lab 01/28/13 1110  NA 137  K 3.7  CL 100  CO2 28  GLUCOSE 173*  BUN 18  CREATININE 1.25*  CALCIUM 9.6   Liver Function Tests:  Recent Labs Lab 01/28/13 1110  AST 12  ALT 17  ALKPHOS 69  BILITOT 0.5  PROT 7.6  ALBUMIN 3.0*   No results found for this basename: LIPASE, AMYLASE,  in the last 168 hours  Recent Labs Lab 01/29/13 1125  AMMONIA 18   CBC:  Recent Labs Lab 01/27/13 1918 01/28/13 1110  WBC 7.0 6.9  NEUTROABS 5.7 4.5  HGB 13.8 13.5  HCT 41.4 40.2  MCV 85.9 85.4  PLT 353 349   Cardiac Enzymes: No results found for this basename: CKTOTAL, CKMB, CKMBINDEX, TROPONINI,  in  the last 168 hours BNP (last 3 results) No results found for this basename: PROBNP,  in the last 8760 hours CBG:  Recent Labs Lab 01/28/13 1640 01/28/13 2117 01/29/13 0648 01/29/13 1117 01/29/13 1559  GLUCAP 153* 134* 135* 165* 160*    Recent Results (from the past 240 hour(s))  URINE CULTURE     Status: None   Collection Time    01/27/13  6:45 PM      Result Value Range Status   Specimen Description URINE, CATHETERIZED   Final   Special Requests NONE   Final   Culture  Setup Time 01/27/2013 20:43   Final   Colony Count NO GROWTH   Final    Culture NO GROWTH   Final   Report Status 01/28/2013 FINAL   Final  CULTURE, BLOOD (ROUTINE X 2)     Status: None   Collection Time    01/27/13  7:05 PM      Result Value Range Status   Specimen Description BLOOD ARM RIGHT   Final   Special Requests BOTTLES DRAWN AEROBIC ONLY 5CC   Final   Culture  Setup Time 01/28/2013 01:09   Final   Culture     Final   Value:        BLOOD CULTURE RECEIVED NO GROWTH TO DATE CULTURE WILL BE HELD FOR 5 DAYS BEFORE ISSUING A FINAL NEGATIVE REPORT   Report Status PENDING   Incomplete  CULTURE, BLOOD (ROUTINE X 2)     Status: None   Collection Time    01/27/13  7:11 PM      Result Value Range Status   Specimen Description BLOOD HAND RIGHT   Final   Special Requests BOTTLES DRAWN AEROBIC ONLY 3CC   Final   Culture  Setup Time 01/28/2013 01:10   Final   Culture     Final   Value:        BLOOD CULTURE RECEIVED NO GROWTH TO DATE CULTURE WILL BE HELD FOR 5 DAYS BEFORE ISSUING A FINAL NEGATIVE REPORT   Report Status PENDING   Incomplete     Studies: Ct Head Wo Contrast  01/28/2013   *RADIOLOGY REPORT*  Clinical Data: Altered mental status.  CT HEAD WITHOUT CONTRAST  Technique:  Contiguous axial images were obtained from the base of the skull through the vertex without contrast.  Comparison: 01/27/2013  Findings: Again noted is the area of evolving chronic infarct / encephalomalacia in the left PCA territory involving the left occipital lobe and posterior parietal lobe.  Less pronounced area of encephalomalacia within the posterior right parietal lobe, stable.  Chronic microvascular changes throughout the white matter. Old lacunar infarcts in the thalami bilaterally.  No acute infarction.  No hemorrhage.  No hydrocephalus or extra-axial fluid collection.  No acute bony abnormality. Visualized paranasal sinuses and mastoids clear.  Orbital soft tissues unremarkable.  IMPRESSION: Stable appearance of the brain.  No acute findings.   Original Report Authenticated By:  Charlett Nose, M.D.   Ct Head Wo Contrast  01/27/2013   *RADIOLOGY REPORT*  Clinical Data: Unresponsive.  Confused and combative.  CT HEAD WITHOUT CONTRAST  Technique:  Contiguous axial images were obtained from the base of the skull through the vertex without contrast.  Comparison: Head CTA 11/18/2012.  Findings: Evolving area of encephalomalacia in the left occipital lobe, posterior left parietal region and posterior left temporal region, compatible with old left PCA territory infarction.  There is also a small area of cortical atrophy and  encephalomalacia in the posterior right parietal region which is similar to the prior study.  No definite acute intracranial abnormality.  Specifically, no definite signs of acute/subacute cerebral ischemia, no evidence of acute intracranial hemorrhage, no mass, significant mass effect or hydrocephalous.  No acute displaced skull fractures are identified.  Visualized paranasal sinuses and mastoids are well pneumatized.  IMPRESSION: .  Evolving area of encephalomalacia in the posterior left cerebral hemisphere, as above, related to prior left PCA territory infarction. 2.  Smaller area of encephalomalacia in the posterior right parietal region as well.   Original Report Authenticated By: Trudie Reed, M.D.   Mr Brain Wo Contrast  01/28/2013   *RADIOLOGY REPORT*  Clinical Data: History of prior stroke, now with increasing confusion. Stroke risk factors include hypertension, prior stroke, chronic renal disease, and diabetes.  MRI HEAD WITHOUT CONTRAST  Technique:  Multiplanar, multiecho pulse sequences of the brain and surrounding structures were obtained according to standard protocol without intravenous contrast.  Comparison: CT head 01/28/2013.  MR head 11/18/2012.  Findings: A large area of subacute to chronic infarction affects the left posterior temporal and occipital lobes.  This is partially hemorrhagic, with areas of subacute to chronic blood products reflecting  typical reperfusion seen in infarction affecting this vascular territory. This infarct was acute 11/18/2012.  Along the lateral margin of the occipital horn of the left lateral ventricle, there are subcentimeter foci of acute infarction superimposed. These are new from April 2010 and may reflect the patient's acute mental status change.  There is moderate atrophy.  There is extensive chronic microvascular ischemic change.  Numerous microbleeds are seen throughout the brainstem, cerebellum, and cerebral hemispheres consistent with hypertensive cerebrovascular disease sequelae.  Flow voids are maintained in the internal carotid arteries and basilar artery.  The major dural venous sinuses are patent.  There is no pituitary or cerebellar tonsillar abnormality.  Upper cervical region unremarkable.  Calvarium and skull base are intact.  Negative orbits.  No acute sinus or mastoid disease.  IMPRESSION: Small subcentimeter foci of acute infarction in the left occipital lobe superimposed on a large chronic left PCA territory infarction as described above.  Multiple foci of subacute to chronic hemorrhage related to prior infarction and hypertensive cerebrovascular disease.  Atrophy with moderate to severe chronic microvascular ischemic change.   Original Report Authenticated By: Davonna Belling, M.D.   Dg Chest Portable 1 View  01/28/2013   *RADIOLOGY REPORT*  Clinical Data: Altered mental status, confusion  PORTABLE CHEST - 1 VIEW  Comparison: 01/27/2013  Findings: Cardiomegaly again noted.  Study is limited by poor inspiration.  Mild basilar atelectasis.  No segmental infiltrate or pulmonary edema.  IMPRESSION:   Limited study by poor inspiration.  Mild basilar atelectasis.  No segmental infiltrate or pulmonary edema.   Original Report Authenticated By: Natasha Mead, M.D.    Scheduled Meds: . aspirin  81 mg Oral Daily  . citalopram  10 mg Oral Daily  . cloNIDine  0.1 mg Oral BID  . diltiazem  360 mg Oral Daily  .  insulin aspart  0-5 Units Subcutaneous QHS  . insulin aspart  0-9 Units Subcutaneous TID WC  . isosorbide-hydrALAZINE  1 tablet Oral BID  . loratadine  10 mg Oral Daily  . metoprolol succinate  100 mg Oral Q breakfast  . polyethylene glycol  17 g Oral Daily  . pregabalin  25 mg Oral BID  . Rivaroxaban  15 mg Oral Q supper   Continuous Infusions: . sodium chloride 100  mL/hr at 01/29/13 1746    Principal Problem:   Altered mental status Active Problems:   CKD (chronic kidney disease) stage 3, GFR 30-59 ml/min   Diabetes mellitus, controlled   Acute cerebral infarction/Large acute left PCA infarct & Small acute right PCA cortical infarct involving the occipital   Hypertension   Unspecified hereditary and idiopathic peripheral neuropathy   Depression    Time spent: 40 minutes   Ailin Rochford, J  [facility] Pager [phone]. If 7PM-7AM, please contact night-coverage at [email or web address], password [facility] 01/29/2013, 10:29 PM  LOS: 1 day

## 2013-01-29 NOTE — Progress Notes (Signed)
When administering the first dose of haldol, when holding the IV in place there was no drainage and the flush worked. The IV fluid went in, and then pushed haldol slowing. After letting the IV fluid run more, there was a moderate amount of drainage when disconnecting. The IV was then interpreted to be infiltrated and was taken out. New IV started so new dose was given. Spoke with pharmacy and Consulting civil engineer. Pt heart rate still spiking above 150, pt is moaning and agitated. Will continue to monitor.

## 2013-01-29 NOTE — Progress Notes (Signed)
Pt becoming very agitated, irritable. Heart rate is soaring into 150s, up to the 180s. Cannot get accurate BP reading. Gave haldol to help with agitation. Will continue to monitor.

## 2013-01-29 NOTE — Progress Notes (Signed)
Stroke Team Progress Note  HISTORY [name] is a 67 y.o. female who was admitted in April of this year with afib and RVR and diagnosed with an acute left PCA infarct. Patient had been discharged and receiving therapy at home. Recently discharged from services due to nonprogression. Was able to hold a conversation and feed herself. On 01/27/2013 while at the doctors office she had an acute altered mental status. Initially she became cold then somewhat confused. After that she did not speak at all for a period of time. She seemed to come back to baseline later in the evening and was unaware of the events earlier in the day. This improved mental status was not prolonged and she again developed an AMS. She was brought to the ED last evening and a head CT was performed. It has been reviewed. It showed no acute changes and the patient was sent back to her facility. She was no better 01/28/2013 and they sent her back to [facility]. A MRI of the brain was performed and reviewed. It showed an acute small area of infarction adjacent to her known left occipital infarct.   Patient is on Xarelto and ASA.   Date last known well: Date: 01/27/2013  Time last known well: Time: 09:45  tPA Given: No: Recent infarct, outside time window  SUBJECTIVE The patient is confused. There was no meaningful conversation. She now has a Comptroller present.  OBJECTIVE Most recent Vital Signs: Filed Vitals:   01/28/13 2117 01/29/13 0251 01/29/13 0420 01/29/13 0639  BP: 166/100 134/82 156/95 98/68  Pulse: 97 58 92 84  Temp: 97.6 F (36.4 C) 97.1 F (36.2 C)  97.3 F (36.3 C)  TempSrc: Oral Oral  Axillary  Resp: 16 20    Height:      Weight:      SpO2: 99% 99%  100%   CBG (last 3)   Recent Labs  01/28/13 1640 01/28/13 2117 01/29/13 0648  GLUCAP 153* 134* 135*    IV Fluid Intake:     MEDICATIONS  . aspirin  81 mg Oral Daily  . citalopram  10 mg Oral Daily  . cloNIDine  0.1 mg Oral BID  . diltiazem  360 mg Oral Daily   . insulin aspart  0-5 Units Subcutaneous QHS  . insulin aspart  0-9 Units Subcutaneous TID WC  . isosorbide-hydrALAZINE  1 tablet Oral BID  . loratadine  10 mg Oral Daily  . metoprolol succinate  100 mg Oral Q breakfast  . polyethylene glycol  17 g Oral Daily  . pregabalin  25 mg Oral Daily  . Rivaroxaban  15 mg Oral Q supper   PRN:  haloperidol lactate, hydrALAZINE  Diet:  Advanced to dysphagia 1 diet with thin liquids Activity:  Bathroom privileges with assistance DVT Prophylaxis:  Xarelto  CLINICALLY SIGNIFICANT STUDIES Basic Metabolic Panel:  Recent Labs Lab 01/28/13 1110  NA 137  K 3.7  CL 100  CO2 28  GLUCOSE 173*  BUN 18  CREATININE 1.25*  CALCIUM 9.6   Liver Function Tests:  Recent Labs Lab 01/28/13 1110  AST 12  ALT 17  ALKPHOS 69  BILITOT 0.5  PROT 7.6  ALBUMIN 3.0*   CBC:  Recent Labs Lab 01/27/13 1918 01/28/13 1110  WBC 7.0 6.9  NEUTROABS 5.7 4.5  HGB 13.8 13.5  HCT 41.4 40.2  MCV 85.9 85.4  PLT 353 349   Coagulation: No results found for this basename: LABPROT, INR,  in the last 168 hours  Cardiac Enzymes: No results found for this basename: CKTOTAL, CKMB, CKMBINDEX, TROPONINI,  in the last 168 hours Urinalysis:  Recent Labs Lab 01/27/13 1845 01/28/13 2149  COLORURINE YELLOW YELLOW  LABSPEC 1.019 1.014  PHURINE 6.5 7.0  GLUCOSEU 100* NEGATIVE  HGBUR MODERATE* SMALL*  BILIRUBINUR NEGATIVE NEGATIVE  KETONESUR 15* 15*  PROTEINUR >300* >300*  UROBILINOGEN 0.2 0.2  NITRITE NEGATIVE NEGATIVE  LEUKOCYTESUR NEGATIVE NEGATIVE   Lipid Panel    Component Value Date/Time   CHOL 116 01/29/2013 0630   TRIG 71 01/29/2013 0630   HDL 74 01/29/2013 0630   CHOLHDL 1.6 01/29/2013 0630   VLDL 14 01/29/2013 0630   LDLCALC 28 01/29/2013 0630   HgbA1C  Lab Results  Component Value Date   HGBA1C 7.4* 01/28/2013    Urine Drug Screen:     Component Value Date/Time   LABOPIA NONE DETECTED 01/28/2013 2149   COCAINSCRNUR NONE DETECTED 01/28/2013 2149    LABBENZ NONE DETECTED 01/28/2013 2149   AMPHETMU NONE DETECTED 01/28/2013 2149   THCU NONE DETECTED 01/28/2013 2149   LABBARB NONE DETECTED 01/28/2013 2149    Alcohol Level: No results found for this basename: ETH,  in the last 168 hours  Ct Head Wo Contrast 01/28/2013 Stable appearance of the brain.  No acute findings.     Ct Head Wo Contrast 01/27/2013 Evolving area of encephalomalacia in the posterior left cerebral hemisphere, as above, related to prior left PCA territory infarction. 2.  Smaller area of encephalomalacia in the posterior right parietal region as well.      Mr Brain Wo Contrast  01/28/2013    Small subcentimeter foci of acute infarction in the left occipital lobe superimposed on a large chronic left PCA territory infarction as described above.  Multiple foci of subacute to chronic hemorrhage related to prior infarction and hypertensive cerebrovascular disease.  Atrophy with moderate to severe chronic microvascular ischemic change.    Dg Chest Portable 1 View 01/28/2013 Limited study by poor inspiration.  Mild basilar atelectasis.  No segmental infiltrate or pulmonary edema.    Dg Chest Port 1 View 01/27/2013 Cardiomegaly with pulmonary vascular congestion      2D Echocardiogram   pending   Carotid Doppler  April 2014 - bilateral mild soft plaque noted. No significant internal carotid or stenosis. Vertebral artery flow is antegrade.   EKG  atrial fibrillation ventricular response 77 beats per minute  Therapy Recommendations pending  Physical Exam    NEUROLOGIC:   MENTAL STATUS: Alert but confused. Unable to follow commands.  CRANIAL NERVES: pupils equal and reactive to light,extraocular muscles intact, facial sensation and strength symmetric, uvula midlinec, tongue midline MOTOR: normal bulk and tone, moves all extremities. Unable to assess strength. SENSORY: Could not evaluate COORDINATION: Patient unable to follow commands for testing   ASSESSMENT Ms.  [name] is a 67 y.o. female presenting with confusion. TPA was not given secondary to a recent infarct and Xarelto therapy. Imaging confirms a small subcentimeter foci of acute infarction in the left occipital lobe  Infarct felt to be embolic secondary to atrial fibrillation.  On aspirin 81 mg orally every day and Xarelto prior to admission. Now on aspirin 81 mg daily and Xarelto for secondary stroke prevention. Patient with continued confusion. Work up underway.   Atrial fibrillation  Mildly low blood pressures at times  Peripheral neuropathy with chronic pain  Hypertension history  Diabetes mellitus - hemoglobin A1c 7.4. Goal less than 7.  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  Past swallowing evaluation with speech therapy - advanced to dysphagia 1 diet with thin liquids  Hospital day # 1  TREATMENT/PLAN  Continue aspirin 81 mg orally every day and Xarelto for secondary stroke prevention.  Risk factor modification  Await therapist's evaluation  Workup in progress  Order 2-D echo   EEG pending  Check ammonia level  Increase Lyrica to twice a day  Hassel Neth [facility] Pager [phone] 01/29/2013, 7:39 AM  I have personally obtained a history, examined the patient, evaluated imaging results, and formulated the assessment and plan of care. I agree with the above.  S/p EEG today awaiting official resutls Increase lyrica to 25mg   BID.  On xarelto  and ASA.  Pleaes check ammonia level and limit benzodiazapine use.

## 2013-01-29 NOTE — Evaluation (Signed)
Clinical/Bedside Swallow Evaluation Patient Details  Name: [name] MRN: [id number] Date of Birth: 1946/07/16  Today's Date: 01/29/2013 Time: 0830-0850 SLP Time Calculation (min): 20 min  Past Medical History:  Past Medical History  Diagnosis Date  . Hypertension   . Diabetes mellitus   . Glaucoma   . Cataract    Past Surgical History: History reviewed. No pertinent past surgical history. HPI:  [name] is a 67 y.o. female with pmh of HTN, DM, CKD stage 3, atrial fibrillation (on xarelto) and previous stroke; came to ED from SNF due to acute onset of AMS.  History is retrieved from ED records, patient family members at bedside and nursing home. Patient sent from nursing home for evaluation of altered mental status and elevated blood pressure. She is currently going through rehabilitation following a stroke and acutely stopped answering any questions and, at times, appears to be talking to people who aren't present in the room. No history of CP, cough, fever, abdominal pain, HA's, nausea, vomiting or any other acute complaints. MRI revealed new superimposed area of stroke on previous stroke. Also with multiple foci of subacute to chronic hemorrhage related to prior infarction and hypertensive cerebrovascular disease.     Assessment / Plan / Recommendation Clinical Impression  Patient presents with [name] range of motion and coordintaion during p.o. intake with a mildly delayed swallow initiaiton; however patient appeard tp protect airway throuhgout trials.  Cognition is imapcting ability to safely masticate textures and as a result it is recommeded that this patient initiate a Dys.1 textures diet with thin liquids and full staff assist to feed at a slow pace.    Aspiration Risk  Mild    Diet Recommendation Dysphagia 1 (Puree);Thin liquid   Liquid Administration via: Cup;Straw Medication Administration: Crushed with puree Supervision: Staff feed patient;Full supervision/cueing for  compensatory strategies Compensations: Slow rate;Small sips/bites;Check for pocketing;Unable to follow commands to complete Postural Changes and/or Swallow Maneuvers: Seated upright 90 degrees    Other  Recommendations Oral Care Recommendations: Oral care BID   Follow Up Recommendations  Skilled Nursing facility;24 hour supervision/assistance    Frequency and Duration min 2x/week  1 week   Pertinent Vitals/Pain none    SLP Swallow Goals Patient will consume recommended diet without observed clinical signs of aspiration with: Maximum assistance;Maximal cueing Goal #3: Patient will demonstrate effecient mastication of solid textures with Max assist clinician cues.   Swallow Study Prior Functional Status   Unknown; consuming regular and thin during CIR stay in April    General Date of Onset: 01/28/13 HPI: [name] is a 67 y.o. female with pmh of HTN, DM, CKD stage 3, atrial fibrillation (on xarelto) and previous stroke; came to ED from SNF due to acute onset of AMS.  History is retrieved from ED records, patient family members at bedside and nursing home. Patient sent from nursing home for evaluation of altered mental status and elevated blood pressure. She is currently going through rehabilitation following a stroke and acutely stopped answering any questions and, at times, appears to be talking to people who aren't present in the room. No history of CP, cough, fever, abdominal pain, HA's, nausea, vomiting or any other acute complaints. MRI revealed new superimposed area of stroke on previous stroke. Also with multiple foci of subacute to chronic hemorrhage related to prior infarction and hypertensive cerebrovascular disease.   Type of Study: Bedside swallow evaluation Previous Swallow Assessment: Bedside during CIR stay in April Diet Prior to this Study: NPO Temperature Spikes  Noted: No Respiratory Status: Room air History of Recent Intubation: No Behavior/Cognition:  Alert;Cooperative;Confused;Distractible;Requires cueing;Doesn't follow directions Oral Cavity - Dentition: Missing dentition Self-Feeding Abilities: Total assist Patient Positioning: Upright in bed Baseline Vocal Quality: Clear Volitional Cough: Cognitively unable to elicit Volitional Swallow: Unable to elicit    Oral/Motor/Sensory Function Overall Oral Motor/Sensory Function: Appears within functional limits for tasks assessed (difficulty to asses due to limited ability to follow command)   Ice Chips Ice chips: Within functional limits Presentation: Spoon   Thin Liquid Thin Liquid: Within functional limits Presentation: Cup;Straw    Nectar Thick Nectar Thick Liquid: Not tested   Honey Thick Honey Thick Liquid: Not tested   Puree Puree: Within functional limits Other Comments: patient allowed SLP to feed 1/2 size bites and hand over hand was minimally effective due to confusion   Solid   GO    Solid: Impaired Oral Phase Impairments: Impaired anterior to posterior transit;Poor awareness of bolus Oral Phase Functional Implications: Right anterior spillage;Left anterior spillage;Oral holding Other Comments: suspect this is impacted by cognition      [name], M.A., CCC-SLP [phone]  [name] 01/29/2013,9:07 AM

## 2013-01-29 NOTE — Evaluation (Signed)
Physical Therapy Evaluation Patient Details Name: [name] MRN: [id number] DOB: 01-12-1946 Today's Date: 01/29/2013 Time: 1435-1450 PT Time Calculation (min): 15 min  PT Assessment / Plan / Recommendation Clinical Impression  Pt is a 67 yo female admitted for AMS, has been admitted recently for CVA and was a rehab PTA. Pt remains to demonstrate L side neglect, L sided weakness, limited communication, and noted cognitive deficits. Pt remains appropriate to return to rehab once medically stable    PT Assessment  Patient needs continued PT services    Follow Up Recommendations  SNF    Does the patient have the potential to tolerate intense rehabilitation      Barriers to Discharge        Equipment Recommendations       Recommendations for Other Services     Frequency Min 3X/week    Precautions / Restrictions Precautions Precautions: Fall Restrictions Weight Bearing Restrictions: No   Pertinent Vitals/Pain Pt denies pain      Mobility  Bed Mobility Bed Mobility: Supine to Sit;Sit to Supine Supine to Sit: 1: +2 Total assist;HOB elevated Supine to Sit: Patient Percentage: 10% Sit to Supine: 1: +2 Total assist;HOB flat Sit to Supine: Patient Percentage: 0% Details for Bed Mobility Assistance: pt inititiated with R UE transfer OOB  Transfers Transfers: Not assessed Ambulation/Gait Ambulation/Gait Assistance: Not tested (comment) Modified Rankin (Stroke Patients Only) Pre-Morbid Rankin Score: Severe disability Modified Rankin: Severe disability    Exercises     PT Diagnosis: Hemiplegia non-dominant side  PT Problem List: Decreased strength;Decreased activity tolerance;Decreased balance;Decreased mobility;Decreased coordination;Decreased cognition PT Treatment Interventions: Functional mobility training;Therapeutic activities;Therapeutic exercise;Balance training;Neuromuscular re-education   PT Goals Acute Rehab PT Goals PT Goal Formulation: Patient unable to  participate in goal setting Time For Goal Achievement: 02/12/13 Potential to Achieve Goals: Good Pt will go Supine/Side to Sit: with max assist;with rail PT Goal: Supine/Side to Sit - Progress: Goal set today Pt will Sit at Edge of Bed: with supervision;3-5 min;with unilateral upper extremity support PT Goal: Sit at Edge Of Bed - Progress: Goal set today Pt will go Sit to Stand: with +2 total assist;with upper extremity assist PT Goal: Sit to Stand - Progress: Goal set today Pt will Transfer Bed to Chair/Chair to Bed: with +2 total assist PT Transfer Goal: Bed to Chair/Chair to Bed - Progress: Goal set today Pt will Stand: with +2 total assist;1 - 2 min;with bilateral upper extremity support PT Goal: Stand - Progress: Goal set today  Visit Information  Last PT Received On: 01/29/13 Assistance Needed: +2    Subjective Data  Subjective: Pt received supine in bed, safety sitter present.   Prior Functioning  Home Living Available Help at Discharge: Skilled Nursing Facility Type of Home: Skilled Nursing Facility Additional Comments: was attending rehab for previous strokes Prior Function Level of Independence: Needs assistance Communication Communication: Receptive difficulties;Expressive difficulties (limited verbal capabilities)    Cognition  Cognition Arousal/Alertness: Awake/alert Behavior During Therapy: Flat affect Overall Cognitive Status: Impaired/Different from baseline Area of Impairment: Following commands;Problem solving;Attention;Orientation Orientation Level: Disoriented to;Place;Time;Situation Current Attention Level: Focused Following Commands: Follows one step commands inconsistently Problem Solving: Slow processing;Decreased initiation;Difficulty sequencing;Requires verbal cues;Requires tactile cues    Extremity/Trunk Assessment Right Upper Extremity Assessment RUE ROM/Strength/Tone: Deficits;Unable to fully assess;Due to impaired cognition RUE  ROM/Strength/Tone Deficits: able to raise arm against gravity Left Upper Extremity Assessment LUE ROM/Strength/Tone: Deficits;Unable to fully assess;Due to impaired cognition (no voluntary mvmt) Right Lower Extremity Assessment RLE ROM/Strength/Tone: Unable to fully  assess;Due to impaired cognition (able to initiate kicking R LE  out) Left Lower Extremity Assessment LLE ROM/Strength/Tone: Deficits;Unable to fully assess;Due to impaired cognition (no voluntary mvmt) Trunk Assessment Trunk Assessment: Normal   Balance Balance Balance Assessed: Yes Static Sitting Balance Static Sitting - Balance Support: Right upper extremity supported;Feet supported Static Sitting - Level of Assistance: 3: Mod assist Static Sitting - Comment/# of Minutes: 5 min  End of Session PT - End of Session Activity Tolerance: Patient tolerated treatment well Patient left: in bed;with call bell/phone within reach (sitter present) Nurse Communication: Mobility status  GP     Marcene Brawn 01/29/2013, 4:52 PM  Lewis Shock, PT, DPT Pager #: [phone] Office #: [phone]

## 2013-01-30 LAB — GLUCOSE, CAPILLARY
Glucose-Capillary: 123 mg/dL — ABNORMAL HIGH (ref 70–99)
Glucose-Capillary: 182 mg/dL — ABNORMAL HIGH (ref 70–99)

## 2013-01-30 MED ORDER — METOPROLOL TARTRATE 1 MG/ML IV SOLN
5.0000 mg | Freq: Once | INTRAVENOUS | Status: AC
  Administered 2013-01-30: 5 mg via INTRAVENOUS
  Filled 2013-01-30: qty 5

## 2013-01-30 MED ORDER — DILTIAZEM HCL ER COATED BEADS 120 MG PO CP24
120.0000 mg | ORAL_CAPSULE | Freq: Once | ORAL | Status: AC
  Administered 2013-01-30: 120 mg via ORAL
  Filled 2013-01-30: qty 1

## 2013-01-30 MED ORDER — DILTIAZEM HCL ER COATED BEADS 120 MG PO CP24: 120.0000 mg | ORAL_CAPSULE | Freq: Every day | ORAL | Status: DC

## 2013-01-30 MED ORDER — ACETAMINOPHEN 325 MG PO TABS
650.0000 mg | ORAL_TABLET | Freq: Four times a day (QID) | ORAL | Status: DC | PRN
  Administered 2013-02-02: 650 mg via ORAL
  Filled 2013-01-30: qty 2

## 2013-01-30 MED ORDER — DILTIAZEM HCL ER COATED BEADS 240 MG PO CP24
480.0000 mg | ORAL_CAPSULE | Freq: Every day | ORAL | Status: DC
  Administered 2013-01-31 – 2013-02-02 (×3): 480 mg via ORAL
  Filled 2013-01-30 (×3): qty 2

## 2013-01-30 MED ORDER — WHITE PETROLATUM GEL
Status: AC
  Administered 2013-01-30: 0.2
  Filled 2013-01-30: qty 5

## 2013-01-30 NOTE — Progress Notes (Addendum)
Stroke Team Progress Note  HISTORY [name] is a 67 y.o. female who was admitted in April of this year with afib and RVR and diagnosed with an acute left PCA infarct. Patient had been discharged and receiving therapy at home. Recently discharged from services due to nonprogression. Was able to hold a conversation and feed herself. On 01/27/2013 while at the doctors office she had an acute altered mental status. Initially she became cold then somewhat confused. After that she did not speak at all for a period of time. She seemed to come back to baseline later in the evening and was unaware of the events earlier in the day. This improved mental status was not prolonged and she again developed an AMS. She was brought to the ED last evening and a head CT was performed. It has been reviewed. It showed no acute changes and the patient was sent back to her facility. She was no better 01/28/2013 and they sent her back to [facility]. A MRI of the brain was performed and reviewed. It showed an acute small area of infarction adjacent to her known left occipital infarct.   Patient is on Xarelto and ASA.   Date last known well: Date: 01/27/2013  Time last known well: Time: 09:45  tPA Given: No: Recent infarct, outside time window  SUBJECTIVE There no family members in the room at this time. The patient appears more alert today although she remains confused. She readily converses but her responses are inaccurate. She is able to follow commands more consistently.  OBJECTIVE Most recent Vital Signs: Filed Vitals:   01/29/13 2200 01/30/13 0200 01/30/13 0445 01/30/13 0600  BP: 166/88 144/91  150/89  Pulse: 96 105 105 136  Temp: 98.2 F (36.8 C) 98.6 F (37 C)  98.6 F (37 C)  TempSrc:      Resp: 18 18  18   Height:      Weight:      SpO2: 100% 98%  97%   CBG (last 3)   Recent Labs  01/29/13 1559 01/29/13 2204 01/30/13 0639  GLUCAP 160* 123* 156*    IV Fluid Intake:   . sodium chloride 100 mL/hr at  01/29/13 1746    MEDICATIONS  . aspirin  81 mg Oral Daily  . citalopram  10 mg Oral Daily  . cloNIDine  0.1 mg Oral BID  . diltiazem  360 mg Oral Daily  . insulin aspart  0-5 Units Subcutaneous QHS  . insulin aspart  0-9 Units Subcutaneous TID WC  . isosorbide-hydrALAZINE  1 tablet Oral BID  . loratadine  10 mg Oral Daily  . metoprolol succinate  100 mg Oral Q breakfast  . polyethylene glycol  17 g Oral Daily  . pregabalin  25 mg Oral BID  . Rivaroxaban  15 mg Oral Q supper   PRN:  haloperidol lactate, hydrALAZINE  Diet:  Advanced to dysphagia 1 diet with thin liquids Activity:  Bathroom privileges with assistance DVT Prophylaxis:  Xarelto  CLINICALLY SIGNIFICANT STUDIES Basic Metabolic Panel:   Recent Labs Lab 01/28/13 1110  NA 137  K 3.7  CL 100  CO2 28  GLUCOSE 173*  BUN 18  CREATININE 1.25*  CALCIUM 9.6   Liver Function Tests:   Recent Labs Lab 01/28/13 1110  AST 12  ALT 17  ALKPHOS 69  BILITOT 0.5  PROT 7.6  ALBUMIN 3.0*   CBC:   Recent Labs Lab 01/27/13 1918 01/28/13 1110  WBC 7.0 6.9  NEUTROABS 5.7 4.5  HGB 13.8 13.5  HCT 41.4 40.2  MCV 85.9 85.4  PLT 353 349   Coagulation: No results found for this basename: LABPROT, INR,  in the last 168 hours Cardiac Enzymes: No results found for this basename: CKTOTAL, CKMB, CKMBINDEX, TROPONINI,  in the last 168 hours Urinalysis:   Recent Labs Lab 01/27/13 1845 01/28/13 2149  COLORURINE YELLOW YELLOW  LABSPEC 1.019 1.014  PHURINE 6.5 7.0  GLUCOSEU 100* NEGATIVE  HGBUR MODERATE* SMALL*  BILIRUBINUR NEGATIVE NEGATIVE  KETONESUR 15* 15*  PROTEINUR >300* >300*  UROBILINOGEN 0.2 0.2  NITRITE NEGATIVE NEGATIVE  LEUKOCYTESUR NEGATIVE NEGATIVE   Lipid Panel    Component Value Date/Time   CHOL 116 01/29/2013 0630   TRIG 71 01/29/2013 0630   HDL 74 01/29/2013 0630   CHOLHDL 1.6 01/29/2013 0630   VLDL 14 01/29/2013 0630   LDLCALC 28 01/29/2013 0630   HgbA1C  Lab Results  Component Value Date    HGBA1C 7.4* 01/28/2013    Urine Drug Screen:     Component Value Date/Time   LABOPIA NONE DETECTED 01/28/2013 2149   COCAINSCRNUR NONE DETECTED 01/28/2013 2149   LABBENZ NONE DETECTED 01/28/2013 2149   AMPHETMU NONE DETECTED 01/28/2013 2149   THCU NONE DETECTED 01/28/2013 2149   LABBARB NONE DETECTED 01/28/2013 2149    Alcohol Level: No results found for this basename: ETH,  in the last 168 hours  Ct Head Wo Contrast 01/28/2013 Stable appearance of the brain.  No acute findings.     Ct Head Wo Contrast 01/27/2013 Evolving area of encephalomalacia in the posterior left cerebral hemisphere, as above, related to prior left PCA territory infarction. 2.  Smaller area of encephalomalacia in the posterior right parietal region as well.      Mr Brain Wo Contrast  01/28/2013    Small subcentimeter foci of acute infarction in the left occipital lobe superimposed on a large chronic left PCA territory infarction as described above.  Multiple foci of subacute to chronic hemorrhage related to prior infarction and hypertensive cerebrovascular disease.  Atrophy with moderate to severe chronic microvascular ischemic change.    Dg Chest Portable 1 View 01/28/2013 Limited study by poor inspiration.  Mild basilar atelectasis.  No segmental infiltrate or pulmonary edema.    Dg Chest Port 1 View 01/27/2013 Cardiomegaly with pulmonary vascular congestion      2D Echocardiogram   pending   Carotid Doppler  April 2014 - bilateral mild soft plaque noted. No significant internal carotid or stenosis. Vertebral artery flow is antegrade.   EKG  atrial fibrillation ventricular response 77 beats per minute  Therapy Recommendations skilled nursing facility recommended.  Physical Exam    NEUROLOGIC:   MENTAL STATUS: Alert but confused. She follows simple commands fairly consistently. Readily converses. More verbal today. CRANIAL NERVES: pupils equal and reactive to light, right homonymous hemianopsia. Right  facial droop. MOTOR: normal bulk and tone, moves all extremities. Unable to assess strength. SENSORY: Could not evaluate COORDINATION: Patient unable to follow commands for testing   ASSESSMENT [name] is a 67 y.o. female presenting with confusion. TPA was not given secondary to a recent infarct and Xarelto therapy. Imaging confirms a small subcentimeter foci of acute infarction in the left occipital lobe  Infarct felt to be embolic secondary to atrial fibrillation.  On aspirin 81 mg orally every day and Xarelto prior to admission. Now on aspirin 81 mg daily and Xarelto for secondary stroke prevention. Patient with continued confusion. Work up underway.   Atrial  fibrillation - back on diltiazem for rate control.  Peripheral neuropathy with chronic pain  Hypertension history - blood pressures labile  Diabetes mellitus - hemoglobin A1c 7.4. Goal less than 7.  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  Passed swallowing evaluation with speech therapy - advanced to dysphagia 1 diet with thin liquids  Hospital day # 2  TREATMENT/PLAN  Continue aspirin 81 mg orally every day and Xarelto for secondary stroke prevention.  Risk factor modification  Therapist recommends skilled nursing facility placement.  Workup in progress  2-D echo pending  EEG pending  Checked ammonia level - 18 WNL  Increased Lyrica to twice a day for neuropathy pain  Delton See PA-C [facility] Pager [phone] 01/30/2013, 8:01 AM  I have personally obtained a history, examined the patient, evaluated imaging results, and formulated the assessment and plan of care. I agree with the above.  Pt's mental status improved. She able to follow simple commands.  EEG showing diffuse slowing Ammonia level within normal limits Encourage PO intake.

## 2013-01-30 NOTE — Progress Notes (Signed)
  Echocardiogram 2D Echocardiogram has been performed.  [place] 01/30/2013, 1:46 PM

## 2013-01-30 NOTE — Progress Notes (Signed)
Pulse rate is 105.

## 2013-01-30 NOTE — Progress Notes (Signed)
[facility] PROGRESS NOTE  [name] JXB:[id number] DOB: 1946/01/06 DOA: 01/28/2013 PCP: [name], MD  Assessment/Plan:  1. Altered mental status; secondary toSmall subcentimeter foci of acute infarction in the left occipital  lobe superimposed on a large chronic left PCA territory infarction. Per neurology Continue aspirin 81 mg orally every day and Xarelto for secondary stroke prevention,Risk factor modification,Therapist recommends skilled nursing facility placement([facility] patient), Workup in progress, 2-D echo pending, EEG pending, ammonia level - 18 WNL, Increased Lyrica to twice a day for neuropathy pain 2. DM; controlled on current medication  3. Hypertension/tachycardia; increase Cardizem CD  480 mg QD  Principal Problem:   Altered mental status Active Problems:   CKD (chronic kidney disease) stage 3, GFR 30-59 ml/min   Diabetes mellitus, controlled   Acute cerebral infarction/Large acute left PCA infarct & Small acute right PCA cortical infarct involving the occipital   Hypertension   Unspecified hereditary and idiopathic peripheral neuropathy   Depression  Code Status: DNR Family Communication: Patient CNA  Disposition Plan: Per neurology  Consultants:  Neurology ( Dr.Yuriy)  Procedures: None Antibiotics:  None  HPI/Subjective:  healthcare team although still some confusionLinda Blevins is a 67 y.o. BF PMHx HTN, DM, CKD stage 3, atrial fibrillation (on xarelto) and previous stroke; came to ED from SNF due to acute onset of AMS. History is retrieved from ED records, patient family members at bedside and nursing home. Patient sent from nursing home for evaluation of altered mental status and elevated blood pressure. She is currently going through rehabilitation following a stroke and acutely stopped answering any questions and, at times, appears to be talking to people who aren't present in the room. No history of CP, cough, fever,  abdominal pain, HA's, nausea, vomiting or any other acute complaints. TODAY patient alert and oriented x1 (to person). Patient will intermittently follows commands. Pt able to interact with Health care Tm but still some confusion.   Objective: Filed Vitals:   01/30/13 0445 01/30/13 0600 01/30/13 1033 01/30/13 1414  BP:  150/89 144/82 137/85  Pulse: 105 136 108 88  Temp:  98.6 F (37 C) 98.4 F (36.9 C) 98.2 F (36.8 C)  TempSrc:    Oral  Resp:  18 18 18   Height:      Weight:      SpO2:  97% 100% 99%    Intake/Output Summary (Last 24 hours) at 01/30/13 1503 Last data filed at 01/30/13 1153  Gross per 24 hour  Intake     50 ml  Output      0 ml  Net     50 ml   Filed Weights   01/28/13 1351  Weight: 101.1 kg (222 lb 14.2 oz)    Exam:  General: Patient awake and interacting with  Healthcare team.   Cardiovascular: Regular rhythm and rate, negative murmurs rubs or gallops, DP/PT pulses 2+  Respiratory: Clear to auscultation bilaterally  Abdomen: Soft nontender nondistended plus bowel sounds  Neurologic alert and oriented x1 (person), patient's affect appropriate (laughing and joking with staff), pupils equal round and reactive to light and accommodation although pupils are set at abnormal angles therefore sure how well patient actually visualize his surroundings, upper extremity and lower extremity strength 4/5, sensation intact throughout.     Data Reviewed: Basic Metabolic Panel:  Recent Labs Lab 01/28/13 1110  NA 137  K 3.7  CL 100  CO2 28  GLUCOSE 173*  BUN 18  CREATININE 1.25*  CALCIUM 9.6  Liver Function Tests:  Recent Labs Lab 01/28/13 1110  AST 12  ALT 17  ALKPHOS 69  BILITOT 0.5  PROT 7.6  ALBUMIN 3.0*   No results found for this basename: LIPASE, AMYLASE,  in the last 168 hours  Recent Labs Lab 01/29/13 1125  AMMONIA 18   CBC:  Recent Labs Lab 01/27/13 1918 01/28/13 1110  WBC 7.0 6.9  NEUTROABS 5.7 4.5  HGB 13.8 13.5  HCT 41.4  40.2  MCV 85.9 85.4  PLT 353 349   Cardiac Enzymes: No results found for this basename: CKTOTAL, CKMB, CKMBINDEX, TROPONINI,  in the last 168 hours BNP (last 3 results) No results found for this basename: PROBNP,  in the last 8760 hours CBG:  Recent Labs Lab 01/29/13 1117 01/29/13 1559 01/29/13 2204 01/30/13 0639 01/30/13 1131  GLUCAP 165* 160* 123* 156* 159*    Recent Results (from the past 240 hour(s))  URINE CULTURE     Status: None   Collection Time    01/27/13  6:45 PM      Result Value Range Status   Specimen Description URINE, CATHETERIZED   Final   Special Requests NONE   Final   Culture  Setup Time 01/27/2013 20:43   Final   Colony Count NO GROWTH   Final   Culture NO GROWTH   Final   Report Status 01/28/2013 FINAL   Final  CULTURE, BLOOD (ROUTINE X 2)     Status: None   Collection Time    01/27/13  7:05 PM      Result Value Range Status   Specimen Description BLOOD ARM RIGHT   Final   Special Requests BOTTLES DRAWN AEROBIC ONLY 5CC   Final   Culture  Setup Time 01/28/2013 01:09   Final   Culture     Final   Value:        BLOOD CULTURE RECEIVED NO GROWTH TO DATE CULTURE WILL BE HELD FOR 5 DAYS BEFORE ISSUING A FINAL NEGATIVE REPORT   Report Status PENDING   Incomplete  CULTURE, BLOOD (ROUTINE X 2)     Status: None   Collection Time    01/27/13  7:11 PM      Result Value Range Status   Specimen Description BLOOD HAND RIGHT   Final   Special Requests BOTTLES DRAWN AEROBIC ONLY 3CC   Final   Culture  Setup Time 01/28/2013 01:10   Final   Culture     Final   Value:        BLOOD CULTURE RECEIVED NO GROWTH TO DATE CULTURE WILL BE HELD FOR 5 DAYS BEFORE ISSUING A FINAL NEGATIVE REPORT   Report Status PENDING   Incomplete     Studies: No results found.  Scheduled Meds: . aspirin  81 mg Oral Daily  . citalopram  10 mg Oral Daily  . cloNIDine  0.1 mg Oral BID  . [START ON 01/31/2013] diltiazem  480 mg Oral Daily  . insulin aspart  0-5 Units Subcutaneous QHS   . insulin aspart  0-9 Units Subcutaneous TID WC  . isosorbide-hydrALAZINE  1 tablet Oral BID  . loratadine  10 mg Oral Daily  . metoprolol succinate  100 mg Oral Q breakfast  . polyethylene glycol  17 g Oral Daily  . pregabalin  25 mg Oral BID  . Rivaroxaban  15 mg Oral Q supper   Continuous Infusions: . sodium chloride 100 mL/hr at 01/30/13 1021    Principal Problem:   Altered mental  status Active Problems:   CKD (chronic kidney disease) stage 3, GFR 30-59 ml/min   Diabetes mellitus, controlled   Acute cerebral infarction/Large acute left PCA infarct & Small acute right PCA cortical infarct involving the occipital   Hypertension   Unspecified hereditary and idiopathic peripheral neuropathy   Depression    Time spent: 30 minutes   WOODS, CURTIS, J  [facility] Pager [phone]. If 7PM-7AM, please contact night-coverage at [email or web address], password [facility] 01/30/2013, 3:03 PM  LOS: 2 days

## 2013-01-30 NOTE — Progress Notes (Signed)
Patients heart rate in the 140's Call to Md  Order for lopressor 5mg   IV given and administered. Will continue to monitor.

## 2013-01-30 NOTE — Progress Notes (Signed)
Pt telemetry strip revealed afib/flutter. Called [facility] and they stated she had been running like this since 6/21 at around 1430. Was unaware if the MD had been notified of this change. [facility] on call notified. No new orders at present time. Will continue to monitor.

## 2013-01-31 LAB — GLUCOSE, CAPILLARY
Glucose-Capillary: 139 mg/dL — ABNORMAL HIGH (ref 70–99)
Glucose-Capillary: 165 mg/dL — ABNORMAL HIGH (ref 70–99)
Glucose-Capillary: 167 mg/dL — ABNORMAL HIGH (ref 70–99)
Glucose-Capillary: 136 mg/dL — ABNORMAL HIGH (ref 70–99)

## 2013-01-31 MED ORDER — INSULIN GLARGINE 100 UNIT/ML ~~LOC~~ SOLN
5.0000 [IU] | Freq: Every day | SUBCUTANEOUS | Status: DC
  Filled 2013-01-31: qty 0.05

## 2013-01-31 MED ORDER — INSULIN GLARGINE 100 UNIT/ML ~~LOC~~ SOLN
5.0000 [IU] | SUBCUTANEOUS | Status: DC
  Administered 2013-01-31 – 2013-02-01 (×2): 5 [IU] via SUBCUTANEOUS
  Filled 2013-01-31 (×4): qty 0.05

## 2013-01-31 NOTE — Progress Notes (Signed)
Physical Therapy Treatment Patient Details Name: [name] MRN: [id number] DOB: 08-Mar-1946 Today's Date: 01/31/2013 Time: 1308-6578 PT Time Calculation (min): 12 min  PT Assessment / Plan / Recommendation Comments on Treatment Session  Pt. with decreased initiation and ability to participate with functional activities.  Pt. confused and unable to recall reason for admission and unable to understand return to SNF.    Follow Up Recommendations  SNF     Does the patient have the potential to tolerate intense rehabilitation     Barriers to Discharge        Equipment Recommendations  None recommended by PT    Recommendations for Other Services    Frequency Min 3X/week   Plan Discharge plan remains appropriate;Frequency remains appropriate    Precautions / Restrictions Precautions Precautions: Fall Restrictions Weight Bearing Restrictions: No   Pertinent Vitals/Pain Pt. Denies pain    Mobility  Bed Mobility Bed Mobility: Supine to Sit;Sitting - Scoot to Edge of Bed Supine to Sit: 1: +2 Total assist;HOB elevated Supine to Sit: Patient Percentage: 10% Sitting - Scoot to Edge of Bed: 5: Supervision;With rail Transfers Transfers: Sit to Stand;Stand to Sit;Stand Pivot Transfers Sit to Stand: 1: +2 Total assist;From bed Sit to Stand: Patient Percentage: 20% Stand to Sit: To chair/3-in-1;1: +2 Total assist Stand to Sit: Patient Percentage: 20% Stand Pivot Transfers: 1: +2 Total assist Stand Pivot Transfers: Patient Percentage: 20% Details for Transfer Assistance: pt. with limited participation and assistance Ambulation/Gait Ambulation/Gait Assistance: Not tested (comment) Stairs: No Wheelchair Mobility Wheelchair Mobility: No Modified Rankin (Stroke Patients Only) Pre-Morbid Rankin Score: Severe disability Modified Rankin: Severe disability    Exercises     PT Diagnosis:    PT Problem List:   PT Treatment Interventions:     PT Goals Acute Rehab PT Goals PT Goal  Formulation: Patient unable to participate in goal setting Potential to Achieve Goals: Good PT Goal: Supine/Side to Sit - Progress: Progressing toward goal PT Goal: Sit at Edge Of Bed - Progress: Progressing toward goal PT Goal: Sit to Stand - Progress: Progressing toward goal PT Transfer Goal: Bed to Chair/Chair to Bed - Progress: Progressing toward goal PT Goal: Stand - Progress: Progressing toward goal  Visit Information  Last PT Received On: 01/31/13 Assistance Needed: +2    Subjective Data  Subjective: Pt. at [facility], "doing doctor things" prior to admission   Cognition  Cognition Arousal/Alertness: Awake/alert Behavior During Therapy: Flat affect Overall Cognitive Status: Impaired/Different from baseline Area of Impairment: Awareness;Safety/judgement;Following commands;Memory;Attention;Orientation;Problem solving Orientation Level: Disoriented to;Place;Time;Situation Current Attention Level: Sustained Memory: Decreased short-term memory Following Commands: Follows one step commands with increased time;Follows multi-step commands inconsistently Safety/Judgement: Decreased awareness of safety;Decreased awareness of deficits Problem Solving: Slow processing;Decreased initiation;Difficulty sequencing;Requires verbal cues;Requires tactile cues    Balance  Balance Balance Assessed: Yes Static Sitting Balance Static Sitting - Balance Support: Left upper extremity supported;Feet supported Static Sitting - Level of Assistance: 5: Stand by assistance  End of Session PT - End of Session Equipment Utilized During Treatment: Gait belt Activity Tolerance: Patient tolerated treatment well Patient left: with call bell/phone within reach;in chair (sitter present) Nurse Communication: Mobility status;Need for lift equipment   GP     Feltis, Nicki Reaper 01/31/2013, 8:26 AM Nicki Reaper. Feltis, PT, DPT [phone]

## 2013-01-31 NOTE — Progress Notes (Signed)
[facility] PROGRESS NOTE  [name] ZOX:[id number] DOB: Apr 25, 1946 DOA: 01/28/2013 PCP: [name], MD  Assessment/Plan: 1. Altered mental status; secondary to Small subcentimeter foci of acute infarction in the left occipital  lobe superimposed on a large chronic left PCA territory infarction. Per neurology Continue aspirin 81 mg orally every day and Xarelto for secondary stroke prevention,Risk factor modification,Therapist recommends skilled nursing facility placement([facility] patient). ;2D-Echo; LVEF= 50-55%, left atrium mildly dilated, no cardiac source of embolism identified. Still awaiting EEG (reordered as a stat) continue Increased Lyrica to twice a day for neuropathy pain. Obtain the eval for CIR versus return to [facility] 2. DM; patient's FSBS running high will add Lantus 5 mg for basal insulin  3. Hypertension/tachycardia; increase Cardizem CD 480 mg QD   Code Status: DO NOT RESUSCITATE Family Communication: None Disposition Plan: Following EEG , CIR versus return to [facility]   Consultants:  Neurology (Dr. Satira Sark)  Procedures:  None  Antibiotics:  None  HPI/Subjective:  67 y.o. BF PMHx HTN, DM, CKD stage 3, atrial fibrillation (on xarelto) and previous stroke; came to ED from SNF due to acute onset of AMS. History is retrieved from ED records, patient family members at bedside and nursing home. Patient sent from nursing home for evaluation of altered mental status and elevated blood pressure. She is currently going through rehabilitation following a stroke and acutely stopped answering any questions and, at times, appears to be talking to people who aren't present in the room. No history of CP, cough, fever, abdominal pain, HA's, nausea, vomiting or any other acute complaints. TODAY patient alert and oriented x1 (to person). Able to sit in chair and interacting much more readily with health  care team follows all commands.  Objective: Filed Vitals:   01/31/13 0600 01/31/13 1000 01/31/13 1350 01/31/13 1758  BP: 153/98 175/94 115/75 140/84  Pulse: 90 94 85 72  Temp: 98.2 F (36.8 C) 98.2 F (36.8 C) 98.2 F (36.8 C) 98.3 F (36.8 C)  TempSrc: Oral Oral Oral Oral  Resp: 20 20 20 20   Height:      Weight:      SpO2: 100% 100% 100% 99%    Intake/Output Summary (Last 24 hours) at 01/31/13 1931 Last data filed at 01/31/13 1700  Gross per 24 hour  Intake    420 ml  Output      0 ml  Net    420 ml   Filed Weights   01/28/13 1351  Weight: 101.1 kg (222 lb 14.2 oz)    Exam: General: Patient (67) awake and interacting with Healthcare team.  Cardiovascular: Regular rhythm and rate, negative murmurs rubs or gallops, DP/PT pulses 2+  Respiratory: Clear to auscultation bilaterally  Abdomen: Soft nontender nondistended plus bowel sounds  Neurologic alert and oriented x1 (person), patient's affect appropriate (laughing and joking with staff), pupils equal round and reactive to light and accommodation although pupils are set at abnormal angles therefore sure how well patient actually visualize his surroundings, upper extremity and lower extremity strength 4/5 (right lower extremity strength> left lower extremity strength), sensation intact throughout. Patient able to stand with assistance however still too weak to ambulate or complete neurologic exam  Data Reviewed: Basic Metabolic Panel:  Recent Labs Lab 01/28/13 1110  NA 137  K 3.7  CL 100  CO2 28  GLUCOSE 173*  BUN 18  CREATININE 1.25*  CALCIUM 9.6   Liver Function Tests:  Recent Labs Lab 01/28/13  1110  AST 12  ALT 17  ALKPHOS 69  BILITOT 0.5  PROT 7.6  ALBUMIN 3.0*   No results found for this basename: LIPASE, AMYLASE,  in the last 168 hours  Recent Labs Lab 01/29/13 1125  AMMONIA 18   CBC:  Recent Labs Lab 01/27/13 1918 01/28/13 1110  WBC 7.0 6.9  NEUTROABS 5.7 4.5  HGB 13.8 13.5  HCT 41.4  40.2  MCV 85.9 85.4  PLT 353 349   Cardiac Enzymes: No results found for this basename: CKTOTAL, CKMB, CKMBINDEX, TROPONINI,  in the last 168 hours BNP (last 3 results) No results found for this basename: PROBNP,  in the last 8760 hours CBG:  Recent Labs Lab 01/30/13 1627 01/30/13 2121 01/31/13 0653 01/31/13 1149 01/31/13 1615  GLUCAP 143* 182* 139* 165* 167*    Recent Results (from the past 240 hour(s))  URINE CULTURE     Status: None   Collection Time    01/27/13  6:45 PM      Result Value Range Status   Specimen Description URINE, CATHETERIZED   Final   Special Requests NONE   Final   Culture  Setup Time 01/27/2013 20:43   Final   Colony Count NO GROWTH   Final   Culture NO GROWTH   Final   Report Status 01/28/2013 FINAL   Final  CULTURE, BLOOD (ROUTINE X 2)     Status: None   Collection Time    01/27/13  7:05 PM      Result Value Range Status   Specimen Description BLOOD ARM RIGHT   Final   Special Requests BOTTLES DRAWN AEROBIC ONLY 5CC   Final   Culture  Setup Time 01/28/2013 01:09   Final   Culture     Final   Value:        BLOOD CULTURE RECEIVED NO GROWTH TO DATE CULTURE WILL BE HELD FOR 5 DAYS BEFORE ISSUING A FINAL NEGATIVE REPORT   Report Status PENDING   Incomplete  CULTURE, BLOOD (ROUTINE X 2)     Status: None   Collection Time    01/27/13  7:11 PM      Result Value Range Status   Specimen Description BLOOD HAND RIGHT   Final   Special Requests BOTTLES DRAWN AEROBIC ONLY 3CC   Final   Culture  Setup Time 01/28/2013 01:10   Final   Culture     Final   Value:        BLOOD CULTURE RECEIVED NO GROWTH TO DATE CULTURE WILL BE HELD FOR 5 DAYS BEFORE ISSUING A FINAL NEGATIVE REPORT   Report Status PENDING   Incomplete     Studies: No results found.  Scheduled Meds: . aspirin  81 mg Oral Daily  . citalopram  10 mg Oral Daily  . cloNIDine  0.1 mg Oral BID  . diltiazem  480 mg Oral Daily  . insulin aspart  0-5 Units Subcutaneous QHS  . insulin aspart   0-9 Units Subcutaneous TID WC  . isosorbide-hydrALAZINE  1 tablet Oral BID  . loratadine  10 mg Oral Daily  . metoprolol succinate  100 mg Oral Q breakfast  . polyethylene glycol  17 g Oral Daily  . pregabalin  25 mg Oral BID  . Rivaroxaban  15 mg Oral Q supper   Continuous Infusions: . sodium chloride 20 mL/hr at 01/30/13 1624    Principal Problem:   Altered mental status Active Problems:   CKD (chronic kidney disease) stage 3,  GFR 30-59 ml/min   Diabetes mellitus, controlled   Acute cerebral infarction/Large acute left PCA infarct & Small acute right PCA cortical infarct involving the occipital   Hypertension   Unspecified hereditary and idiopathic peripheral neuropathy   Depression    Time spent: 35 minutes    Laurette Villescas, J  [facility] Pager [phone]. If 7PM-7AM, please contact night-coverage at [email or web address], password [facility] 01/31/2013, 7:31 PM  LOS: 3 days

## 2013-01-31 NOTE — Clinical Social Work Note (Signed)
Clinical Social Work Department BRIEF PSYCHOSOCIAL ASSESSMENT 01/31/2013  Patient:  [name], [name]     Account Number:  [id number]     Admit date:  01/28/2013  Clinical Social Worker:  Read Drivers  Date/Time:  01/31/2013 02:33 PM  Referred by:  Physician  Date Referred:  01/31/2013 Referred for  SNF Placement   Other Referral:   none   Interview type:  Other - See comment Other interview type:   patient  CSW also called and spoke with pt sister, [name] and daughter, [name]    PSYCHOSOCIAL DATA Living Status:  FACILITY Admitted from facility:  [facility] Level of care:  Skilled Nursing Facility Primary support name:  [name] Primary support relationship to patient:  CHILD, ADULT Degree of support available:   strong    CURRENT CONCERNS Current Concerns  Post-Acute Placement   Other Concerns:   none    SOCIAL WORK ASSESSMENT / PLAN CSW assessed pt at bedside.  Sitter was present in the room.  Pt was pleasant and appreciative of CSW assistance. Pt was oriented to self and to situation.  Pt main caregiver is daughter, [name].  Daughter [name] stated that they were trying to get pt into [facility].  When CSW contacted Janie at [facility], Janie relayed to me that pt was on the LONG TERM care wait list.  Wille Celeste stated that she would review the pt and notify me of response tomorrow. The plan will be to return back to [facility] if pt cannot get into [facility].   Assessment/plan status:  Psychosocial Support/Ongoing Assessment of Needs Other assessment/ plan:     none   Information/referral to community resources:   SNF    PATIENT'S/FAMILY'S RESPONSE TO PLAN OF CARE: Pt and family were very appreciative of CSW assistance and support.       Vickii Penna, LCSWA [phone]  Clinical Social Work

## 2013-01-31 NOTE — Clinical Social Work Note (Signed)
CSW contacted daughter, Lajoyce Corners to inform of [facility]'s response and agreement to review for long term care.  If [facility] cannot accept pt, pt is to return to [place] and remain on the long term list for [facility].  Vickii Penna, LCSWA [phone]  Clinical Social Work

## 2013-01-31 NOTE — Progress Notes (Signed)
Stroke Team Progress Note  HISTORY [name] is a 67 y.o. female who was admitted in April of this year with afib and RVR and diagnosed with an acute left PCA infarct. Patient had been discharged and receiving therapy at home. Recently discharged from services due to nonprogression. Was able to hold a conversation and feed herself. On 01/27/2013 while at the doctors office she had an acute altered mental status. Initially she became cold then somewhat confused. After that she did not speak at all for a period of time. She seemed to come back to baseline later in the evening and was unaware of the events earlier in the day. This improved mental status was not prolonged and she again developed an AMS. She was brought to the ED last evening and a head CT was performed. It has been reviewed. It showed no acute changes and the patient was sent back to her facility. She was no better 01/28/2013 and they sent her back to [facility]. A MRI of the brain was performed and reviewed. It showed an acute small area of infarction adjacent to her known left occipital infarct.   Patient is on Xarelto and ASA.   Date last known well: Date: 01/27/2013  Time last known well: Time: 09:45  tPA Given: No: Recent infarct, outside time window  SUBJECTIVE No new events. No complaints.  OBJECTIVE Most recent Vital Signs: Filed Vitals:   01/30/13 2122 01/31/13 0131 01/31/13 0600 01/31/13 1000  BP: 162/102 128/76 153/98 175/94  Pulse: 77 89 90 94  Temp: 98.6 F (37 C) 98.5 F (36.9 C) 98.2 F (36.8 C) 98.2 F (36.8 C)  TempSrc:   Oral Oral  Resp: 18 18 20 20   Height:      Weight:      SpO2: 99% 98% 100% 100%   CBG (last 3)   Recent Labs  01/30/13 2121 01/31/13 0653 01/31/13 1149  GLUCAP 182* 139* 165*    IV Fluid Intake:   . sodium chloride 20 mL/hr at 01/30/13 1624    MEDICATIONS  . aspirin  81 mg Oral Daily  . citalopram  10 mg Oral Daily  . cloNIDine  0.1 mg Oral BID  . diltiazem  480 mg Oral Daily   . insulin aspart  0-5 Units Subcutaneous QHS  . insulin aspart  0-9 Units Subcutaneous TID WC  . isosorbide-hydrALAZINE  1 tablet Oral BID  . loratadine  10 mg Oral Daily  . metoprolol succinate  100 mg Oral Q breakfast  . polyethylene glycol  17 g Oral Daily  . pregabalin  25 mg Oral BID  . Rivaroxaban  15 mg Oral Q supper   PRN:  acetaminophen, haloperidol lactate, hydrALAZINE  Diet:  Advanced to dysphagia 1 diet with thin liquids Activity:  Bathroom privileges with assistance DVT Prophylaxis:  Xarelto  CLINICALLY SIGNIFICANT STUDIES Basic Metabolic Panel:   Recent Labs Lab 01/28/13 1110  NA 137  K 3.7  CL 100  CO2 28  GLUCOSE 173*  BUN 18  CREATININE 1.25*  CALCIUM 9.6   Liver Function Tests:   Recent Labs Lab 01/28/13 1110  AST 12  ALT 17  ALKPHOS 69  BILITOT 0.5  PROT 7.6  ALBUMIN 3.0*   CBC:   Recent Labs Lab 01/27/13 1918 01/28/13 1110  WBC 7.0 6.9  NEUTROABS 5.7 4.5  HGB 13.8 13.5  HCT 41.4 40.2  MCV 85.9 85.4  PLT 353 349   Coagulation: No results found for this basename: LABPROT, INR,  in the last 168 hours Cardiac Enzymes: No results found for this basename: CKTOTAL, CKMB, CKMBINDEX, TROPONINI,  in the last 168 hours Urinalysis:   Recent Labs Lab 01/27/13 1845 01/28/13 2149  COLORURINE YELLOW YELLOW  LABSPEC 1.019 1.014  PHURINE 6.5 7.0  GLUCOSEU 100* NEGATIVE  HGBUR MODERATE* SMALL*  BILIRUBINUR NEGATIVE NEGATIVE  KETONESUR 15* 15*  PROTEINUR >300* >300*  UROBILINOGEN 0.2 0.2  NITRITE NEGATIVE NEGATIVE  LEUKOCYTESUR NEGATIVE NEGATIVE   Lipid Panel    Component Value Date/Time   CHOL 116 01/29/2013 0630   TRIG 71 01/29/2013 0630   HDL 74 01/29/2013 0630   CHOLHDL 1.6 01/29/2013 0630   VLDL 14 01/29/2013 0630   LDLCALC 28 01/29/2013 0630   HgbA1C  Lab Results  Component Value Date   HGBA1C 7.4* 01/28/2013    Urine Drug Screen:     Component Value Date/Time   LABOPIA NONE DETECTED 01/28/2013 2149   COCAINSCRNUR NONE  DETECTED 01/28/2013 2149   LABBENZ NONE DETECTED 01/28/2013 2149   AMPHETMU NONE DETECTED 01/28/2013 2149   THCU NONE DETECTED 01/28/2013 2149   LABBARB NONE DETECTED 01/28/2013 2149    Alcohol Level: No results found for this basename: ETH,  in the last 168 hours  Ct Head Wo Contrast 01/28/2013 Stable appearance of the brain.  No acute findings.     Ct Head Wo Contrast 01/27/2013 Evolving area of encephalomalacia in the posterior left cerebral hemisphere, as above, related to prior left PCA territory infarction. 2.  Smaller area of encephalomalacia in the posterior right parietal region as well.      Mr Brain Wo Contrast 01/28/2013    Small subcentimeter foci of acute infarction in the left occipital lobe superimposed on a large chronic left PCA territory infarction as described above.  Multiple foci of subacute to chronic hemorrhage related to prior infarction and hypertensive cerebrovascular disease.  Atrophy with moderate to severe chronic microvascular ischemic change.    Dg Chest Portable 1 View 01/28/2013 Limited study by poor inspiration.  Mild basilar atelectasis.  No segmental infiltrate or pulmonary edema.   01/27/2013 Cardiomegaly with pulmonary vascular congestion     2D Echocardiogram     Carotid Doppler  April 2014 - bilateral mild soft plaque noted. No significant internal carotid or stenosis. Vertebral artery flow is antegrade.  EKG  atrial fibrillation ventricular response 77 beats per minute  EEG negative for seizures, diffuse slowing  Therapy Recommendations skilled nursing facility recommended.  Physical Exam    NEUROLOGIC:   MENTAL STATUS: Alert but confused. She follows simple commands fairly consistently. Readily converses. More verbal today. ORIENTED TO "1980, [place], [place]), CANNOT NAME PEN.  CRANIAL NERVES: pupils equal and reactive to light, right homonymous hemianopsia. Right facial droop. MOTOR: normal bulk and tone, moves all extremities. Unable to  assess strength. SENSORY: Could not evaluate COORDINATION: Patient unable to follow commands for testing   ASSESSMENT [name] is a 67 y.o. female presenting with confusion. TPA was not given secondary to a recent infarct and Xarelto therapy. Imaging confirms a small subcentimeter foci of acute infarction in the left occipital lobe  Infarct felt to be embolic secondary to atrial fibrillation.  On aspirin 81 mg orally every day and Xarelto prior to admission. Now on aspirin 81 mg daily and Xarelto for secondary stroke prevention. Patient with continued confusion - ammonia level within normal limits - overall neuro exam is not consistent with size or location of stroke. ? Baseline dementia prior to stroke has contributed.  Atrial fibrillation - back on diltiazem for rate control.  Peripheral neuropathy with chronic pain. Increased Lyrica to twice a day Hypertension history - blood pressures labile  Diabetes mellitus - hemoglobin A1c 7.4. Goal less than 7.  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  Passed swallowing evaluation with speech therapy - advanced to dysphagia 1 diet with thin liquids  Increased Lyrica to twice a day for neuropathy pain  Hospital day # 3  TREATMENT/PLAN  Continue aspirin 81 mg orally every day and Xarelto for secondary stroke prevention.  Risk factor modification  Therapist recommends skilled nursing facility placement.  F/u 2-D echo pending  Annie Main, MSN, RN, ANVP-BC, ANP-BC, GNP-BC [facility] Pager: [phone] 01/31/2013 1:17 PM  I have personally obtained a history, examined the patient, evaluated imaging results, and formulated the assessment and plan of care. I agree with the above.  Suanne Marker, MD 01/31/2013, 6:59 PM Certified in Neurology, Neurophysiology and Neuroimaging [facility] - Stroke Team  Please refer to [email or web address] for  on-call Stroke MD

## 2013-01-31 NOTE — Progress Notes (Signed)
UR COMPLETED  

## 2013-01-31 NOTE — Clinical Social Work Note (Addendum)
11:00am- CSW received call from [facility] verifying that pt is eligible to return to facility upon d/c.  CSW left message at [facility] re: pt returning to facility upon d/c.  Vickii Penna, LCSWA [phone]  Clinical Social Work

## 2013-01-31 NOTE — Progress Notes (Signed)
Speech Language Pathology Dysphagia Treatment Patient Details Name: [name] MRN: [id number] DOB: 31-Aug-1945 Today's Date: 01/31/2013 Time: 4098-1191 SLP Time Calculation (min): 20 min  Assessment / Plan / Recommendation Clinical Impression  Patient presents with improved cognition and as a result improved swallow function.  Patient demonstrates mildly prolonged mastication of regular textures and no overt pharyngeal s/s of aspiration with thin liquids via straw.  As a result, it is recommended that this patient initiate an upgraded diet; regular textures and thin liquids with medication whole in puree and continued full staff supervision.   SLP also facilitated session with clinician cues to utilize self-feeding compensatory strategies.  Initially patient required Max assist/hand-over-hand assist to utilize utensils; however, after introducing finger foods patient was able to self-feed with Min assist to initially manage cup or cracker.  Patient demonstrated intermittent word finding errors during the session that appeared to impact patient's ability to express wants and needs and as a result an SLE is also recommended.    Diet Recommendation  Initiate / Change Diet: Regular;Thin liquid    SLP Plan Continue with current plan of care   Pertinent Vitals/Pain none   Swallowing Goals  SLP Swallowing Goals Swallow Study Goal #1 - Progress: Progressing toward goal Swallow Study Goal #3 - Progress: Progressing toward goal  General Temperature Spikes Noted: No Respiratory Status: Room air Behavior/Cognition: Alert;Cooperative;Pleasant mood;Requires cueing;Other (comment) (visual impairments) Oral Cavity - Dentition: Missing dentition Patient Positioning: Upright in bed  Oral Cavity - Oral Hygiene Does patient have any of the following "at risk" factors?: Other - dysphagia;Nutritional status - dependent feeder Patient is AT RISK - Oral Care Protocol followed (see row info): Yes    Dysphagia Treatment Treatment focused on: Skilled observation of diet tolerance;Upgraded PO texture trials;Patient/family/caregiver education;Utilization of compensatory strategies;Other (comment) (compensatory strategeis for visual deficits) Family/Caregiver Educated: SLP edcuated patient regarding recommendation for diet upgrade and finger foods to allow for more independnece with self-feeding Treatment Methods/Modalities: Skilled observation Patient observed directly with PO's: Yes Type of PO's observed: Regular;Dysphagia 2 (chopped);Dysphagia 1 (puree);Thin liquids Feeding: Able to feed self;Needs assist;Needs set up;Other (Comment) (Max faded to Min ) Liquids provided via: Straw Oral Phase Signs & Symptoms: Prolonged mastication Type of cueing: Tactile Amount of cueing: Minimal   GO    Charlane Ferretti., CCC-SLP [phone]  [name] 01/31/2013, 4:20 PM

## 2013-02-01 LAB — COMPREHENSIVE METABOLIC PANEL
Alkaline Phosphatase: 61 U/L (ref 39–117)
BUN: 22 mg/dL (ref 6–23)
GFR calc Af Amer: 43 mL/min — ABNORMAL LOW (ref >90)
Glucose, Bld: 178 mg/dL — ABNORMAL HIGH (ref 70–99)
Potassium: 4.2 mEq/L (ref 3.5–5.1)
Total Bilirubin: 0.4 mg/dL (ref 0.3–1.2)
Total Protein: 7.1 g/dL (ref 6.0–8.3)

## 2013-02-01 LAB — GLUCOSE, CAPILLARY
Glucose-Capillary: 196 mg/dL — ABNORMAL HIGH (ref 70–99)
Glucose-Capillary: 147 mg/dL — ABNORMAL HIGH (ref 70–99)
Glucose-Capillary: 125 mg/dL — ABNORMAL HIGH (ref 70–99)

## 2013-02-01 MED ORDER — CLONIDINE HCL 0.2 MG PO TABS
0.2000 mg | ORAL_TABLET | Freq: Two times a day (BID) | ORAL | Status: DC
  Administered 2013-02-01 – 2013-02-02 (×2): 0.2 mg via ORAL
  Filled 2013-02-01 (×5): qty 1

## 2013-02-01 NOTE — Progress Notes (Signed)
EEG notified RN that EEG was done on 01/29/13. See Dr. Thad Ranger progress note with results on 01/29/13 at 1919. Results are not in the results review tab of epic but are in the notes. [name]

## 2013-02-01 NOTE — Progress Notes (Signed)
Stroke Team Progress Note  HISTORY [name] is a 67 y.o. female who was admitted in April of this year with afib and RVR and diagnosed with an acute left PCA infarct. Patient had been discharged and receiving therapy at home. Recently discharged from services due to nonprogression. Was able to hold a conversation and feed herself. On 01/27/2013 while at the doctors office she had an acute altered mental status. Initially she became cold then somewhat confused. After that she did not speak at all for a period of time. She seemed to come back to baseline later in the evening and was unaware of the events earlier in the day. This improved mental status was not prolonged and she again developed an AMS. She was brought to the ED last evening and a head CT was performed. It has been reviewed. It showed no acute changes and the patient was sent back to her facility. She was no better 01/28/2013 and they sent her back to [facility]. A MRI of the brain was performed and reviewed. It showed an acute small area of infarction adjacent to her known left occipital infarct.   Patient is on Xarelto and ASA.   Date last known well: Date: 01/27/2013  Time last known well: Time: 09:45  tPA Given: No: Recent infarct, outside time window  SUBJECTIVE "I need to go to the bathroom, they will come get me." When asked about her medication compliance prior to admission, she rambled without answering the question.  OBJECTIVE Most recent Vital Signs: Filed Vitals:   01/31/13 2200 02/01/13 0200 02/01/13 0600 02/01/13 1014  BP: 161/98 122/77 131/88 183/96  Pulse: 76 86 75 91  Temp: 98.5 F (36.9 C) 97.9 F (36.6 C) 98.1 F (36.7 C) 98.4 F (36.9 C)  TempSrc:    Oral  Resp: 18 18 18 20   Height:      Weight:      SpO2: 100% 100% 99% 100%   CBG (last 3)   Recent Labs  01/31/13 1615 01/31/13 2210 02/01/13 0645  GLUCAP 167* 136* 151*    IV Fluid Intake:   . sodium chloride 20 mL/hr at 01/30/13 1624     MEDICATIONS  . aspirin  81 mg Oral Daily  . citalopram  10 mg Oral Daily  . cloNIDine  0.1 mg Oral BID  . diltiazem  480 mg Oral Daily  . insulin aspart  0-5 Units Subcutaneous QHS  . insulin aspart  0-9 Units Subcutaneous TID WC  . insulin glargine  5 Units Subcutaneous Q24H  . isosorbide-hydrALAZINE  1 tablet Oral BID  . loratadine  10 mg Oral Daily  . metoprolol succinate  100 mg Oral Q breakfast  . polyethylene glycol  17 g Oral Daily  . pregabalin  25 mg Oral BID  . Rivaroxaban  15 mg Oral Q supper   PRN:  acetaminophen, haloperidol lactate, hydrALAZINE  Diet:  Advanced to dysphagia 1 diet with thin liquids Activity:  Bathroom privileges with assistance DVT Prophylaxis:  Xarelto  CLINICALLY SIGNIFICANT STUDIES Basic Metabolic Panel:   Recent Labs Lab 01/28/13 1110  NA 137  K 3.7  CL 100  CO2 28  GLUCOSE 173*  BUN 18  CREATININE 1.25*  CALCIUM 9.6   Liver Function Tests:   Recent Labs Lab 01/28/13 1110  AST 12  ALT 17  ALKPHOS 69  BILITOT 0.5  PROT 7.6  ALBUMIN 3.0*   CBC:   Recent Labs Lab 01/27/13 1918 01/28/13 1110  WBC 7.0 6.9  NEUTROABS 5.7 4.5  HGB 13.8 13.5  HCT 41.4 40.2  MCV 85.9 85.4  PLT 353 349   Coagulation: No results found for this basename: LABPROT, INR,  in the last 168 hours Cardiac Enzymes: No results found for this basename: CKTOTAL, CKMB, CKMBINDEX, TROPONINI,  in the last 168 hours Urinalysis:   Recent Labs Lab 01/27/13 1845 01/28/13 2149  COLORURINE YELLOW YELLOW  LABSPEC 1.019 1.014  PHURINE 6.5 7.0  GLUCOSEU 100* NEGATIVE  HGBUR MODERATE* SMALL*  BILIRUBINUR NEGATIVE NEGATIVE  KETONESUR 15* 15*  PROTEINUR >300* >300*  UROBILINOGEN 0.2 0.2  NITRITE NEGATIVE NEGATIVE  LEUKOCYTESUR NEGATIVE NEGATIVE   Lipid Panel    Component Value Date/Time   CHOL 116 01/29/2013 0630   TRIG 71 01/29/2013 0630   HDL 74 01/29/2013 0630   CHOLHDL 1.6 01/29/2013 0630   VLDL 14 01/29/2013 0630   LDLCALC 28 01/29/2013 0630    HgbA1C  Lab Results  Component Value Date   HGBA1C 7.4* 01/28/2013    Urine Drug Screen:     Component Value Date/Time   LABOPIA NONE DETECTED 01/28/2013 2149   COCAINSCRNUR NONE DETECTED 01/28/2013 2149   LABBENZ NONE DETECTED 01/28/2013 2149   AMPHETMU NONE DETECTED 01/28/2013 2149   THCU NONE DETECTED 01/28/2013 2149   LABBARB NONE DETECTED 01/28/2013 2149    Alcohol Level: No results found for this basename: ETH,  in the last 168 hours  Ct Head Wo Contrast 01/28/2013 Stable appearance of the brain.  No acute findings.     Ct Head Wo Contrast 01/27/2013 Evolving area of encephalomalacia in the posterior left cerebral hemisphere, as above, related to prior left PCA territory infarction. 2.  Smaller area of encephalomalacia in the posterior right parietal region as well.      Mr Brain Wo Contrast 01/28/2013   Small subcentimeter foci of acute infarction in the left occipital lobe superimposed on a large chronic left PCA territory infarction as described above.  Multiple foci of subacute to chronic hemorrhage related to prior infarction and hypertensive cerebrovascular disease.  Atrophy with moderate to severe chronic microvascular ischemic change.    Dg Chest Portable 1 View 01/28/2013 Limited study by poor inspiration.  Mild basilar atelectasis.  No segmental infiltrate or pulmonary edema.   01/27/2013 Cardiomegaly with pulmonary vascular congestion     2D Echocardiogram   EF 50-55% with no source of embolus.   Carotid Doppler  April 2014 - bilateral mild soft plaque noted. No significant internal carotid or stenosis. Vertebral artery flow is antegrade.  EKG  atrial fibrillation ventricular response 77 beats per minute  EEG negative for seizures, diffuse slowing  Therapy Recommendations skilled nursing facility recommended.  Physical Exam    NEUROLOGIC:   MENTAL STATUS: Alert. She follows simple commands fairly consistently. Readily converses.  CRANIAL NERVES: pupils equal and  reactive to light, right homonymous hemianopsia. Right facial droop. MOTOR: normal bulk and tone, moves all extremities. SENSORY: Intact to light touch    ASSESSMENT [name] is a 67 y.o. female presenting with confusion. TPA was not given secondary to a recent infarct and Xarelto therapy. Imaging confirms a small subcentimeter foci of acute infarction in the left occipital lobe  Infarct felt to be embolic secondary to atrial fibrillation.  On aspirin 81 mg orally every day and Xarelto prior to admission. Now on aspirin 81 mg daily and Xarelto for secondary stroke prevention. Patient with continued confusion - ammonia level within normal limits - overall neuro exam is not consistent  with size or location of stroke. ? Baseline dementia prior to stroke has contributed. Workup completed   Atrial fibrillation - back on diltiazem for rate control.  Peripheral neuropathy with chronic pain. Increased Lyrica to twice a day Hypertension history - blood pressures labile  Diabetes mellitus - hemoglobin A1c 7.4. Goal less than 7.  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  Passed swallowing evaluation with speech therapy - advanced to dysphagia 1 diet with thin liquids  Increased Lyrica to twice a day for neuropathy pain  Hospital day # 4  TREATMENT/PLAN  Continue aspirin 81 mg orally every day and Xarelto for secondary stroke prevention.  Skilled nursing facility placement at discharge No further stroke workup indicated. Patient has a 10-15% risk of having another stroke over the next year, the highest risk is within 2 weeks of the most recent stroke/TIA (risk of having a stroke following a stroke or TIA is the same). Ongoing risk factor control by Primary Care Physician Stroke Service will sign off. Please call should any needs arise. Follow up with Dr. Pearlean Brownie, Stroke Clinic, in 2 months.  Annie Main, MSN, RN, ANVP-BC,  ANP-BC, [facility] Pager: [phone] 02/01/2013 11:23 AM  I have personally obtained a history, examined the patient, evaluated imaging results, and formulated the assessment and plan of care. I agree with the above.  Suanne Marker, MD 02/01/2013, 11:23 AM Certified in Neurology, Neurophysiology and Neuroimaging [facility] - Stroke Team  Please refer to [email or web address] for on-call Stroke MD

## 2013-02-01 NOTE — Evaluation (Signed)
Speech Language Pathology Evaluation Patient Details Name: [name] MRN: [id number] DOB: [birth year] Today's Date: 02/01/2013 Time: 7846-9629 SLP Time Calculation (min): 31 min  Problem List:  Patient Active Problem List   Diagnosis Date Noted  . Altered mental status 01/28/2013  . Hypertension 01/28/2013  . Unspecified hereditary and idiopathic peripheral neuropathy 01/28/2013  . Depression 01/28/2013  . CVA (cerebral infarction) 11/25/2012  . Hypertension, accelerated 11/18/2012  . New Onset Atrial fibrillation with RVR 11/18/2012  . CKD (chronic kidney disease) stage 3, GFR 30-59 ml/min 11/18/2012  . Diabetes mellitus, controlled 11/18/2012  . Headache 11/18/2012  . Acute cerebral infarction/Large acute left PCA infarct & Small acute right PCA cortical infarct involving the occipital 11/18/2012   Past Medical History:  Past Medical History  Diagnosis Date  . Hypertension   . Diabetes mellitus   . Glaucoma   . Cataract    Past Surgical History: History reviewed. No pertinent past surgical history. HPI:  [name] is a 67 y.o. female with pmh of HTN, DM, CKD stage 3, atrial fibrillation (on xarelto) and previous stroke; came to ED from SNF due to acute onset of AMS.  History is retrieved from ED records, patient family members at bedside and nursing home. Patient sent from nursing home for evaluation of altered mental status and elevated blood pressure. She is currently going through rehabilitation following a stroke and acutely stopped answering any questions and, at times, appears to be talking to people who aren't present in the room. No history of CP, cough, fever, abdominal pain, HA's, nausea, vomiting or any other acute complaints. MRI revealed new superimposed area of stroke on previous stroke. Also with multiple foci of subacute to chronic hemorrhage related to prior infarction and hypertensive cerebrovascular disease.     Assessment / Plan / Recommendation Clinical  Impression  Pt presents with baseline cognitive deficits resulting from right CVA that are being addressed at pts SNF. Pts attention, initiation, pragmatics and awareness are impacted at baseline. New deficits include intermittently decreased arousal, word finding. During today's assessment pt was unwilling to participate fully, became easily frustrated by word finding issues and eventually refused to participate. She was observed to have phonemic paraphasias and difficutly expressing wants and needs at phrase level to direct her care. She will benefit from continued SLP services to address cognitive linguistic deficits during acute stay and will also need SLP services at SNF.      SLP Assessment  Patient needs continued Speech Lanaguage Pathology Services    Follow Up Recommendations  Skilled Nursing facility    Frequency and Duration min 2x/week  2 weeks   Pertinent Vitals/Pain NA   SLP Goals  SLP Goals Potential to Achieve Goals: Good SLP Goal #1: Pt will name familiar objects during basic functional taks with min verbal/phonemic cues x5 SLP Goal #1 - Progress: Progressing toward goal SLP Goal #2: Pt will initiate basic functional tasks with mod contextual cues in 80% of opportunities.  SLP Goal #2 - Progress: Progressing toward goal SLP Goal #3: Pt will verbalize wants and needs during functional tasks with moderate verbal cues for word finding in 80% of opportunities.  SLP Goal #3 - Progress: Progressing toward goal  SLP Evaluation Prior Functioning  Cognitive/Linguistic Baseline: Baseline deficits Baseline deficit details: previous right CVA suspected cognitive deficits at baseline Type of Home: Skilled Nursing Facility   Cognition  Overall Cognitive Status: Impaired/Different from baseline Arousal/Alertness: Awake/alert Orientation Level: Oriented to person;Disoriented to place;Disoriented to time;Disoriented to situation Attention:  Focused;Sustained Focused Attention:  Impaired Focused Attention Impairment: Verbal basic;Functional basic Sustained Attention: Impaired Sustained Attention Impairment: Verbal basic;Functional basic Awareness: Impaired Awareness Impairment: Intellectual impairment;Emergent impairment (awareness of physical deficits, poor awareness of cognitive ) Problem Solving: Impaired Problem Solving Impairment: Verbal basic;Functional basic Executive Function: Reasoning;Sequencing;Organizing;Initiating;Self Monitoring;Decision Making Reasoning: Impaired Reasoning Impairment: Verbal basic;Functional basic Sequencing: Impaired Sequencing Impairment: Verbal basic;Functional basic Organizing: Impaired Organizing Impairment: Verbal basic;Functional basic Decision Making: Impaired Decision Making Impairment: Verbal basic;Functional basic Initiating: Impaired Initiating Impairment: Verbal basic;Functional basic Self Monitoring: Impaired Self Monitoring Impairment: Verbal basic;Functional basic Behaviors: Poor frustration tolerance Safety/Judgment: Impaired    Comprehension  Auditory Comprehension Overall Auditory Comprehension: Impaired Yes/No Questions: Within Functional Limits Commands: Impaired One Step Basic Commands: 25-49% accurate Interfering Components: Attention;Visual impairments    Expression Verbal Expression Overall Verbal Expression: Impaired Initiation: Impaired Automatic Speech: Social Response;Name Level of Generative/Spontaneous Verbalization: Sentence;Phrase Repetition: No impairment Naming: Impairment Responsive: Not tested Confrontation: Impaired Verbal Errors: Phonemic paraphasias Pragmatics: Impairment Impairments: Abnormal affect;Eye contact;Monotone Interfering Components: Attention Written Expression Dominant Hand: Right   Oral / Motor Oral Motor/Sensory Function Overall Oral Motor/Sensory Function: Appears within functional limits for tasks assessed Motor Speech Overall Motor Speech: Appears  within functional limits for tasks assessed   GO    [name], MA CCC-SLP [phone]  [name] 02/01/2013, 9:47 AM

## 2013-02-01 NOTE — Clinical Social Work Note (Signed)
CSW contacted [facility] re: pt/family request for long term skilled care- denied.  Pt will return to [facility] (long term care) upon d/c.  Please contact CSW once pt is medically cleared.  Vickii Penna, LCSWA [phone]  Clinical Social Work

## 2013-02-01 NOTE — Progress Notes (Signed)
Speech Language Pathology Dysphagia Treatment Patient Details Name: [name] MRN: [id number] DOB: Sep 18, 1945 Today's Date: 02/01/2013 Time: 5784-6962 SLP Time Calculation (min): 31 min  Assessment / Plan / Recommendation Clinical Impression  Pt recieved pureed foods despite SLP recs for regular textures yesterday. SLP provided snack and observed with pureed meal. Pt mostly refusing PO, and required max verbal cues to initiate self feeding. No evidence of aspiration or oral dysphagia. Pts defictis appear to primarily cognitive linguistic at this point. Will continue to follow for these needs, SLP upgraded diet.     Diet Recommendation  Continue with Current Diet: Regular;Thin liquid    SLP Plan Continue with current plan of care   Pertinent Vitals/Pain NA   Swallowing Goals  SLP Swallowing Goals Patient will consume recommended diet without observed clinical signs of aspiration with: Maximum assistance;Maximal cueing Swallow Study Goal #1 - Progress: Met  General Temperature Spikes Noted: No Respiratory Status: Room air Behavior/Cognition: Alert;Requires cueing;Distractible;Decreased sustained attention Oral Cavity - Dentition: Missing dentition Patient Positioning: Upright in bed  Oral Cavity - Oral Hygiene Does patient have any of the following "at risk" factors?: Other - dysphagia;Nutritional status - dependent feeder   Dysphagia Treatment Treatment focused on: Skilled observation of diet tolerance;Upgraded PO texture trials;Patient/family/caregiver education;Utilization of compensatory strategies;Other (comment) Treatment Methods/Modalities: Skilled observation Patient observed directly with PO's: Yes Type of PO's observed: Regular;Dysphagia 1 (puree);Thin liquids Feeding: Able to feed self;Needs assist Liquids provided via: Straw Type of cueing: Tactile;Verbal Amount of cueing: Moderate   GO    [name], [place] CCC-SLP [phone]  [name] 02/01/2013, 1:41 PM

## 2013-02-01 NOTE — Progress Notes (Signed)
Physical medicine and rehabilitation consult requested chart has been reviewed. Patient presently resides in skilled nursing facility/[facility]. Plan is to return to skilled nursing facility as documented by Child psychotherapist. Therapies have recommended skilled nursing facility. Will hold on formal rehabilitation consult at this time with plan to return to skilled nursing facility.

## 2013-02-01 NOTE — Progress Notes (Addendum)
[facility] PROGRESS NOTE  [name] ZOX:[id number] DOB: 1946-07-25 DOA: 01/28/2013 PCP: [name], MD  Assessment/Plan: 1. Altered mental status; secondary to Small subcentimeter foci of acute infarction in the left occipital  lobe superimposed on a large chronic left PCA territory infarction. Per neurology Continue aspirin 81 mg orally every day and Xarelto for secondary stroke prevention,Risk factor modification,Therapist recommends skilled nursing facility placement([facility] patient). ;2D-Echo; LVEF= 50-55%, left atrium mildly dilated, no cardiac source of embolism identified. Still awaiting EEG (reordered as a stat) continue Increased Lyrica to twice a day for neuropathy pain. CIR evaluation cleared patient to return to [name]  2. DM; patient's start q. a.c./each bedtime FSBS to determine the patient's status since she has been cleared to eat regular diet continue Lantus 5 mg for basal insulin  3. Hypertension/tachycardia; although Cardizem CD 480 mg QD patient's BP is not controlled. Increase Catapres to 0.2 mg twice a day   Code Status: DO NOT RESUSCITATE Family Communication: None Disposition Plan: Hypertension still uncontrolled, have increase Catapres and will need to keep patient a minimum of 24 hours 2-gauge results   Consultants:    REFERRING PHYSICIAN: [name], MD.  HISTORY: A 67 year old female with altered mental status.  MEDICATIONS: Aspirin, Celexa, Catapres, Cardizem, Haldol, Apresoline,  NovoLog, BiDil, Claritin, Toprol, MiraLAX, Lyrica, Xarelto.  CONDITIONS OF RECORDING: This is a 16-channel EEG carried out with the  patient in the drowsy and asleep state.  DESCRIPTION: The background activity was slow with a polymorphic delta  activity seen persistently throughout the tracing. There was a notable  absence of faster rhythms. No evidence of stage II sleep was seen.  Hyperventilation and  intermittent photic stimulation were not performed.  IMPRESSION: This EEG was characterized by general background slowing.  Although, this can be seen with normal drowsiness, would be concerned  for a diffuse disturbance that is etiologically nonspecific, but may  include a metabolic encephalopathy among other possibilities. No  epileptiform activity was noted.  ______________________________  [name], MD  WJ:[id number]  D: 01/29/2013 18:38:26 T: 01/29/2013 19:19:37 Job #: [id number]             Procedures:  None  Antibiotics:  None  HPI/Subjective: 67 y.o. BF PMHx HTN, DM, CKD stage 3, atrial fibrillation (on xarelto) and previous stroke; came to ED from SNF due to acute onset of AMS. History is retrieved from ED records, patient family members at bedside and nursing home. Patient sent from nursing home for evaluation of altered mental status and elevated blood pressure. She is currently going through rehabilitation following a stroke and acutely stopped answering any questions and, at times, appears to be talking to people who aren't present in the room. No history of CP, cough, fever, abdominal pain, HA's, nausea, vomiting or any other acute complaints. TODAY patient alert and oriented x1 (to person). Able to sit in chair and interacting much more readily with health care team follows all commands. Pt anxious to return home in disappointed that she has to remain in the [facility] at least an additional 24 hours   Objective: Filed Vitals:   01/31/13 1758 01/31/13 2200 02/01/13 0200 02/01/13 0600  BP: 140/84 161/98 122/77 131/88  Pulse: 72 76 86 75  Temp: 98.3 F (36.8 C) 98.5 F (36.9 C) 97.9 F (36.6 C) 98.1 F (36.7 C)  TempSrc: Oral     Resp: 20 18 18 18   Height:      Weight:  SpO2: 99% 100% 100% 99%    Intake/Output Summary (Last 24 hours) at 02/01/13 0914 Last data filed at 02/01/13 0155  Gross per 24 hour  Intake    340 ml  Output    200 ml  Net    140 ml    Filed Weights   01/28/13 1351  Weight: 101.1 kg (222 lb 14.2 oz)    Exam:General: Patient awake and interacting with Healthcare team. Cardiovascular: Regular rhythm and rate, negative murmurs rubs or gallops, DP/PT pulses 2+  Respiratory: Clear to auscultation bilaterally  Abdomen: Soft nontender nondistended plus bowel sounds  Neurologic alert and oriented x1 (person), patient's affect appropriate, pupils equal round and reactive to light and accommodation although pupils are set at abnormal angles therefore sure how well patient actually visualize her surroundings, upper extremity and lower extremity strength 4/5, sensation intact throughout   Data Reviewed: Basic Metabolic Panel:  Recent Labs Lab 01/28/13 1110  NA 137  K 3.7  CL 100  CO2 28  GLUCOSE 173*  BUN 18  CREATININE 1.25*  CALCIUM 9.6   Liver Function Tests:  Recent Labs Lab 01/28/13 1110  AST 12  ALT 17  ALKPHOS 69  BILITOT 0.5  PROT 7.6  ALBUMIN 3.0*   No results found for this basename: LIPASE, AMYLASE,  in the last 168 hours  Recent Labs Lab 01/29/13 1125  AMMONIA 18   CBC:  Recent Labs Lab 01/27/13 1918 01/28/13 1110  WBC 7.0 6.9  NEUTROABS 5.7 4.5  HGB 13.8 13.5  HCT 41.4 40.2  MCV 85.9 85.4  PLT 353 349   Cardiac Enzymes: No results found for this basename: CKTOTAL, CKMB, CKMBINDEX, TROPONINI,  in the last 168 hours BNP (last 3 results) No results found for this basename: PROBNP,  in the last 8760 hours CBG:  Recent Labs Lab 01/31/13 0653 01/31/13 1149 01/31/13 1615 01/31/13 2210 02/01/13 0645  GLUCAP 139* 165* 167* 136* 151*    Recent Results (from the past 240 hour(s))  URINE CULTURE     Status: None   Collection Time    01/27/13  6:45 PM      Result Value Range Status   Specimen Description URINE, CATHETERIZED   Final   Special Requests NONE   Final   Culture  Setup Time 01/27/2013 20:43   Final   Colony Count NO GROWTH   Final   Culture NO GROWTH   Final    Report Status 01/28/2013 FINAL   Final  CULTURE, BLOOD (ROUTINE X 2)     Status: None   Collection Time    01/27/13  7:05 PM      Result Value Range Status   Specimen Description BLOOD ARM RIGHT   Final   Special Requests BOTTLES DRAWN AEROBIC ONLY 5CC   Final   Culture  Setup Time 01/28/2013 01:09   Final   Culture     Final   Value:        BLOOD CULTURE RECEIVED NO GROWTH TO DATE CULTURE WILL BE HELD FOR 5 DAYS BEFORE ISSUING A FINAL NEGATIVE REPORT   Report Status PENDING   Incomplete  CULTURE, BLOOD (ROUTINE X 2)     Status: None   Collection Time    01/27/13  7:11 PM      Result Value Range Status   Specimen Description BLOOD HAND RIGHT   Final   Special Requests BOTTLES DRAWN AEROBIC ONLY 3CC   Final   Culture  Setup Time 01/28/2013 01:10  Final   Culture     Final   Value:        BLOOD CULTURE RECEIVED NO GROWTH TO DATE CULTURE WILL BE HELD FOR 5 DAYS BEFORE ISSUING A FINAL NEGATIVE REPORT   Report Status PENDING   Incomplete     Studies: No results found.  Scheduled Meds: . aspirin  81 mg Oral Daily  . citalopram  10 mg Oral Daily  . cloNIDine  0.1 mg Oral BID  . diltiazem  480 mg Oral Daily  . insulin aspart  0-5 Units Subcutaneous QHS  . insulin aspart  0-9 Units Subcutaneous TID WC  . insulin glargine  5 Units Subcutaneous Q24H  . isosorbide-hydrALAZINE  1 tablet Oral BID  . loratadine  10 mg Oral Daily  . metoprolol succinate  100 mg Oral Q breakfast  . polyethylene glycol  17 g Oral Daily  . pregabalin  25 mg Oral BID  . Rivaroxaban  15 mg Oral Q supper   Continuous Infusions: . sodium chloride 20 mL/hr at 01/30/13 1624    Principal Problem:   Altered mental status Active Problems:   CKD (chronic kidney disease) stage 3, GFR 30-59 ml/min   Diabetes mellitus, controlled   Acute cerebral infarction/Large acute left PCA infarct & Small acute right PCA cortical infarct involving the occipital   Hypertension   Unspecified hereditary and idiopathic  peripheral neuropathy   Depression    Time spent: 30 minutes    Elijha Dedman, J  [facility] Pager [phone]. If 7PM-7AM, please contact night-coverage at [email or web address], password [facility] 02/01/2013, 9:14 AM  LOS: 4 days

## 2013-02-02 DIAGNOSIS — I10 Essential (primary) hypertension: Secondary | ICD-10-CM

## 2013-02-02 LAB — GLUCOSE, CAPILLARY: Glucose-Capillary: 220 mg/dL — ABNORMAL HIGH (ref 70–99)

## 2013-02-02 MED ORDER — GLIMEPIRIDE 1 MG PO TABS: 1.0000 mg | ORAL_TABLET | Freq: Every day | ORAL | Status: DC

## 2013-02-02 MED ORDER — CLONIDINE HCL 0.1 MG PO TABS: 0.2000 mg | ORAL_TABLET | Freq: Two times a day (BID) | ORAL | Status: DC

## 2013-02-02 MED ORDER — DILTIAZEM HCL ER COATED BEADS 360 MG PO CP24: 360.0000 mg | ORAL_CAPSULE | Freq: Every day | ORAL | Status: DC

## 2013-02-02 MED ORDER — PREGABALIN 25 MG PO CAPS: 25.0000 mg | ORAL_CAPSULE | Freq: Two times a day (BID) | ORAL | Status: DC

## 2013-02-02 NOTE — Progress Notes (Signed)
Physical Therapy Treatment Patient Details Name: [name] MRN: [id number] DOB: 04-01-1946 Today's Date: 02/02/2013 Time: 4098-1191 PT Time Calculation (min): 28 min  PT Assessment / Plan / Recommendation  PT Comments   Improved participation today however standing very limited by pain in her RLE. Per sister she has chronic arthritis and was supposed to have a THA but then she had the stroke. Much improved bed mobility. RN aware to use the lift following session.   Follow Up Recommendations  SNF     Does the patient have the potential to tolerate intense rehabilitation     Barriers to Discharge        Equipment Recommendations  None recommended by PT    Recommendations for Other Services    Frequency Min 3X/week   Progress towards PT Goals    Plan      Precautions / Restrictions Precautions Precautions: Fall Restrictions Weight Bearing Restrictions: No   Pertinent Vitals/Pain Reports right knee pain, RN aware and meds provided    Mobility  Bed Mobility Bed Mobility: Rolling Left;Left Sidelying to Sit Rolling Left: 3: Mod assist Left Sidelying to Sit: 3: Mod assist Details for Bed Mobility Assistance: good initiation today, facilitation for follow through and sequencing specifically for lower extremities Transfers Transfers: Sit to Stand;Stand to Sit Sit to Stand: 1: +2 Total assist Sit to Stand: Patient Percentage: 30% Stand to Sit: 1: +2 Total assist Stand to Sit: Patient Percentage: 30% Stand Pivot Transfers: 1: +2 Total assist Stand Pivot Transfers: Patient Percentage: 20% Details for Transfer Assistance: facilitation for anterior translation of trunk as well as stability as she extends, fear and pain limiting weight bearing through RLE  Ambulation/Gait Ambulation/Gait Assistance: Not tested (comment)     PT Goals Acute Rehab PT Goals PT Goal Formulation: Patient unable to participate in goal setting Time For Goal Achievement: 02/12/13 Potential to  Achieve Goals: Good  Visit Information  Last PT Received On: 02/02/13 Assistance Needed: +2 History of Present Illness: Pt is a 67 yo female admitted for AMS, has been admitted recently for CVA and was a rehab PTA.     Subjective Data      Cognition  Cognition Overall Cognitive Status: Impaired/Different from baseline Current Attention Level: Selective;Alternating Following Commands: Follows one step commands with increased time;Follows multi-step commands consistently Problem Solving: Slow processing;Difficulty sequencing;Requires verbal cues;Requires tactile cues    Balance     End of Session PT - End of Session Equipment Utilized During Treatment: Gait belt Activity Tolerance: Patient tolerated treatment well;Patient limited by pain Patient left: with call bell/phone within reach;in chair;with chair alarm set Nurse Communication: Mobility status;Need for lift equipment   GP     [facility] HELEN 02/02/2013, 1:07 PM

## 2013-02-02 NOTE — Discharge Summary (Signed)
Physician Discharge Summary  [name] ZOX:[id number] DOB: 16-Jul-1946 DOA: 01/28/2013  PCP: [name], MD  Admit date: 01/28/2013 Discharge date: 02/02/2013  Time spent: 40 minutes  Recommendations for Outpatient Follow-up:  Monitor blood pressure and adjust antihypertensives if indicated.  Monitor blood glucose Q. a.c. and at bedtime. 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 female with pmh of HTN, DM, CKD stage 3, atrial fibrillation (on xarelto) and previous stroke; came to ED from SNF due to acute onset of confusion. History is retrieved from ED records, patient family members at bedside and nursing home. Patient sent from nursing home for evaluation of altered mental status and elevated blood pressure. She is currently going through rehabilitation following a stroke and acutely stopped answering any questions and, at times, appears to be talking to people who aren't present in the room. No history of CP, cough, fever, abdominal pain, HA's, nausea, vomiting or any other acute complaints.  Per records and family members no new medications has been added to her regimen. In the ED CT, CXR and initial blood work unrevealing for abnormalities.   Hospital Course:  Patient was admitted to the hospitalist service. Neurology was consulted. TPA was not given secondary to a recent infarct and Xarelto therapy. MRI showed a small subcentimeter foci of acute infarction in the left occipital  lobe Infarct felt to be embolic secondary to atrial fibrillation. On aspirin 81 mg orally every day and Xarelto prior to admission. Now on aspirin 81 mg daily and Xarelto for secondary stroke prevention. Patient with continued confusion - ammonia level within normal limits - overall neuro exam is not consistent with size or location of stroke. EEG showed diffuse slowing but no epileptiform activity.? Baseline dementia prior to stroke has contributed.   Atrial fibrillation rate controlled on diltiazem and metoprolol  Peripheral neuropathy with chronic pain. Increased Lyrica to twice a day   Hypertension history - blood pressures labile. Clonidine was increased to 0.2 mg twice daily. Blood pressure of the past 24 hours has been controlled  Diabetes mellitus - hemoglobin A1c 7.4. Goal less than 7. Was on Lantus and sliding scale NovoLog in house. Will transition to 1 mg of Amaryl daily. We'll need to monitor as an outpatient and adjust as needed  Chronic kidney disease, stage III  Previous acute left posterior communicating artery infarct in April of this year.   Patient is stable for transfer to skilled nursing facility. Have discussed with patient's sisters.  Procedures:  None  Consultations:  Neurology  Discharge Exam: Filed Vitals:   02/01/13 2200 02/02/13 0123 02/02/13 0608 02/02/13 1029  BP: 170/93 126/65 145/91 92/66  Pulse: 84 88 87 79  Temp: 98 F (36.7 C) 98.2 F (36.8 C) 98.1 F (36.7 C) 98 F (36.7 C)  TempSrc: Oral Oral Oral Oral  Resp: 18 18 20 18   Height:      Weight:      SpO2: 98% 100% 98% 97%    General:  Comfortable. Awake. Answers questions, but usually just yes or no and somewhat slow to respond.. Eating lunch. In chair. Cardiovascular: Irregular Respiratory: Clear to auscultation bilaterally without wheeze rhonchi or rales  Discharge Instructions  Discharge Orders   Future Orders Complete By Expires     Diet - low sodium heart healthy  As directed      Diet Carb Modified  As directed     Walk with assistance  As directed         Medication List    TAKE these medications       aspirin 81 MG chewable tablet  Chew 1 tablet (81 mg total) by mouth daily.     cetirizine 10 MG tablet  Commonly known as:  ZYRTEC  Take 10 mg by mouth daily.     citalopram 10 MG tablet  Commonly known as:  CELEXA  Take 10 mg by mouth daily.     cloNIDine 0.1 MG tablet  Commonly known as:  CATAPRES  Take 2 tablets (0.2 mg total) by mouth 2 (two) times daily.     diltiazem 360 MG 24 hr capsule  Commonly known as:  CARDIZEM CD  Take 360 mg by mouth daily.     doxazosin 1 MG tablet  Commonly known as:  CARDURA  Take 1 mg by mouth at bedtime.     glimepiride 1 MG tablet  Commonly known as:  AMARYL  Take 1 tablet (1 mg total) by mouth daily before breakfast.     metoprolol succinate 100 MG 24 hr tablet  Commonly known as:  TOPROL-XL  Take 100 mg by mouth daily. Take with or immediately following a meal.     MILK OF MAGNESIA PO  Take 30 mLs by mouth daily as needed (for constipation). Give if no BM in 3 days     polyethylene glycol packet  Commonly known as:  MIRALAX / GLYCOLAX  Take 17 g by mouth daily.     pregabalin 25 MG capsule  Commonly known as:  LYRICA  Take 1 capsule (25 mg total) by mouth 2 (two) times daily.     Rivaroxaban 15 MG Tabs tablet  Commonly known as:  XARELTO  Take 1 tablet (15 mg total) by mouth daily with supper.     traMADol 100 MG 24 hr tablet  Commonly known as:  ULTRAM-ER  Take 100 mg by mouth every 8 (eight) hours as needed for pain.       No Known Allergies     Follow-up Information   Schedule an appointment as soon as possible for a visit with Gates Rigg, MD. (stroke clinic)    Contact information:   [address] [place] [address] [phone]      followup with Dr. Chilton Si in 2 weeks to monitor blood pressure and diabetes   The results of significant diagnostics  from this hospitalization (including imaging, microbiology, ancillary and laboratory) are listed below for reference.    Significant Diagnostic Studies: Ct Head Wo Contrast  01/28/2013   *RADIOLOGY REPORT*  Clinical Data: Altered mental status.  CT HEAD WITHOUT CONTRAST  Technique:  Contiguous axial images were obtained from the base of the skull through the vertex without contrast.  Comparison: 01/27/2013  Findings: Again noted is the area of evolving chronic infarct / encephalomalacia in the left PCA territory involving the left occipital lobe and posterior parietal lobe.  Less pronounced area of encephalomalacia within the posterior right parietal lobe, stable.  Chronic microvascular  changes throughout the white matter. Old lacunar infarcts in the thalami bilaterally.  No acute infarction.  No hemorrhage.  No hydrocephalus or extra-axial fluid collection.  No acute bony abnormality. Visualized paranasal sinuses and mastoids clear.  Orbital soft tissues unremarkable.  IMPRESSION: Stable appearance of the brain.  No acute findings.   Original Report Authenticated By: Charlett Nose, M.D.   Ct Head Wo Contrast  01/27/2013   *RADIOLOGY REPORT*  Clinical Data: Unresponsive.  Confused and combative.  CT HEAD WITHOUT CONTRAST  Technique:  Contiguous axial images were obtained from the base of the skull through the vertex without contrast.  Comparison: Head CTA 11/18/2012.  Findings: Evolving area of encephalomalacia in the left occipital lobe, posterior left parietal region and posterior left temporal region, compatible with old left PCA territory infarction.  There is also a small area of cortical atrophy and encephalomalacia in the posterior right parietal region which is similar to the prior study.  No definite acute intracranial abnormality.  Specifically, no definite signs of acute/subacute cerebral ischemia, no evidence of acute intracranial hemorrhage, no mass, significant mass effect or hydrocephalous.  No  acute displaced skull fractures are identified.  Visualized paranasal sinuses and mastoids are well pneumatized.  IMPRESSION: .  Evolving area of encephalomalacia in the posterior left cerebral hemisphere, as above, related to prior left PCA territory infarction. 2.  Smaller area of encephalomalacia in the posterior right parietal region as well.   Original Report Authenticated By: Trudie Reed, M.D.   Mr Brain Wo Contrast  01/28/2013   *RADIOLOGY REPORT*  Clinical Data: History of prior stroke, now with increasing confusion. Stroke risk factors include hypertension, prior stroke, chronic renal disease, and diabetes.  MRI HEAD WITHOUT CONTRAST  Technique:  Multiplanar, multiecho pulse sequences of the brain and surrounding structures were obtained according to standard protocol without intravenous contrast.  Comparison: CT head 01/28/2013.  MR head 11/18/2012.  Findings: A large area of subacute to chronic infarction affects the left posterior temporal and occipital lobes.  This is partially hemorrhagic, with areas of subacute to chronic blood products reflecting typical reperfusion seen in infarction affecting this vascular territory. This infarct was acute 11/18/2012.  Along the lateral margin of the occipital horn of the left lateral ventricle, there are subcentimeter foci of acute infarction superimposed. These are new from April 2010 and may reflect the patient's acute mental status change.  There is moderate atrophy.  There is extensive chronic microvascular ischemic change.  Numerous microbleeds are seen throughout the brainstem, cerebellum, and cerebral hemispheres consistent with hypertensive cerebrovascular disease sequelae.  Flow voids are maintained in the internal carotid arteries and basilar artery.  The major dural venous sinuses are patent.  There is no pituitary or cerebellar tonsillar abnormality.  Upper cervical region unremarkable.  Calvarium and skull base are intact.  Negative orbits.  No  acute sinus or mastoid disease.  IMPRESSION: Small subcentimeter foci of acute infarction in the left occipital lobe superimposed on a large chronic left PCA territory infarction as described above.  Multiple foci of subacute to chronic hemorrhage related to prior infarction and hypertensive cerebrovascular disease.  Atrophy with moderate to severe chronic microvascular ischemic change.   Original Report Authenticated By: Davonna Belling, M.D.   Dg Chest Portable 1 View  01/28/2013   *RADIOLOGY REPORT*  Clinical Data: Altered mental status, confusion  PORTABLE CHEST - 1 VIEW  Comparison: 01/27/2013  Findings: Cardiomegaly again noted.  Study is limited by poor inspiration.  Mild basilar atelectasis.  No  segmental infiltrate or pulmonary edema.  IMPRESSION:   Limited study by poor inspiration.  Mild basilar atelectasis.  No segmental infiltrate or pulmonary edema.   Original Report Authenticated By: Natasha Mead, M.D.   Dg Chest Port 1 View  01/27/2013   *RADIOLOGY REPORT*  Clinical Data: Altered mental status and atrial fibrillation  PORTABLE CHEST - 1 VIEW  Comparison: Chest radiograph 11/18/2012  Findings: Cardiac leads project over the chest.  Heart size appears mildly enlarged and stable.  There is pulmonary vascular congestion.  No definite pulmonary edema.  Negative for pneumothorax or visible pleural effusion.  No acute osseous abnormality.  IMPRESSION: Cardiomegaly with pulmonary vascular congestion   Original Report Authenticated By: Britta Mccreedy, M.D.   2D Echocardiogram EF 50-55% with no source of embolus.  Carotid Doppler April 2014 - bilateral mild soft plaque noted. No significant internal carotid or stenosis. Vertebral artery flow is antegrade.  EKG atrial fibrillation ventricular response 77 beats per minute  EEG negative for seizures, diffuse slowing  Microbiology: Recent Results (from the past 240 hour(s))  URINE CULTURE     Status: None   Collection Time    01/27/13  6:45 PM      Result  Value Range Status   Specimen Description URINE, CATHETERIZED   Final   Special Requests NONE   Final   Culture  Setup Time 01/27/2013 20:43   Final   Colony Count NO GROWTH   Final   Culture NO GROWTH   Final   Report Status 01/28/2013 FINAL   Final  CULTURE, BLOOD (ROUTINE X 2)     Status: None   Collection Time    01/27/13  7:05 PM      Result Value Range Status   Specimen Description BLOOD ARM RIGHT   Final   Special Requests BOTTLES DRAWN AEROBIC ONLY 5CC   Final   Culture  Setup Time 01/28/2013 01:09   Final   Culture     Final   Value:        BLOOD CULTURE RECEIVED NO GROWTH TO DATE CULTURE WILL BE HELD FOR 5 DAYS BEFORE ISSUING A FINAL NEGATIVE REPORT   Report Status PENDING   Incomplete  CULTURE, BLOOD (ROUTINE X 2)     Status: None   Collection Time    01/27/13  7:11 PM      Result Value Range Status   Specimen Description BLOOD HAND RIGHT   Final   Special Requests BOTTLES DRAWN AEROBIC ONLY 3CC   Final   Culture  Setup Time 01/28/2013 01:10   Final   Culture     Final   Value:        BLOOD CULTURE RECEIVED NO GROWTH TO DATE CULTURE WILL BE HELD FOR 5 DAYS BEFORE ISSUING A FINAL NEGATIVE REPORT   Report Status PENDING   Incomplete     Labs: Basic Metabolic Panel:  Recent Labs Lab 01/28/13 1110 02/01/13 1451  NA 137 136  K 3.7 4.2  CL 100 101  CO2 28 26  GLUCOSE 173* 178*  BUN 18 22  CREATININE 1.25* 1.44*  CALCIUM 9.6 9.0   Liver Function Tests:  Recent Labs Lab 01/28/13 1110 02/01/13 1451  AST 12 15  ALT 17 19  ALKPHOS 69 61  BILITOT 0.5 0.4  PROT 7.6 7.1  ALBUMIN 3.0* 2.7*   No results found for this basename: LIPASE, AMYLASE,  in the last 168 hours  Recent Labs Lab 01/29/13 1125  AMMONIA 18  CBC:  Recent Labs Lab 01/27/13 1918 01/28/13 1110  WBC 7.0 6.9  NEUTROABS 5.7 4.5  HGB 13.8 13.5  HCT 41.4 40.2  MCV 85.9 85.4  PLT 353 349   Cardiac Enzymes: No results found for this basename: CKTOTAL, CKMB, CKMBINDEX, TROPONINI,  in  the last 168 hours BNP: BNP (last 3 results) No results found for this basename: PROBNP,  in the last 8760 hours CBG:  Recent Labs Lab 02/01/13 1239 02/01/13 1630 02/01/13 2208 02/02/13 0647 02/02/13 1118  GLUCAP 196* 147* 125* 152* 195*   Signed:  Kinjal Neitzke L  [facility] 02/02/2013, 12:26 PM

## 2013-02-02 NOTE — Progress Notes (Signed)
Speech Language Pathology Dysphagia Treatment Patient Details Name: Tijah Hane MRN: [id number] DOB: 1946/01/21 Today's Date: 02/02/2013 Time: 1191-4782 SLP Time Calculation (min): 10 min  Assessment / Plan / Recommendation Clinical Impression  Skilled ST included facilitation of cognitive-linguistic abilities.  Decreased intelligibility due to low vocal intensity in which she slightly increased verbal cues for larger breath prior to speaking.  She named ojects in room when provided with max assist of phrase completion cue 1 of 3 trials and incorrect given verbal phase completion and phonemic cues.  Tactile cues provided to reach, lift and bring cup to mouth.  No s/s aspiration with straw sips.  Continue ST.    Diet Recommendation       SLP Plan Continue with current plan of care   Pertinent Vitals/Pain none   Swallowing Goals     General Temperature Spikes Noted: No Respiratory Status: Room air Behavior/Cognition: Lethargic;Cooperative;Confused;Requires cueing;Decreased sustained attention Oral Cavity - Dentition:  (has several teeth) Patient Positioning: Upright in chair  Oral Cavity - Oral Hygiene Does patient have any of the following "at risk" factors?: Other - dysphagia Brush patient's teeth BID with toothbrush (using toothpaste with fluoride): Yes Patient is AT RISK - Oral Care Protocol followed (see row info): Yes   Dysphagia Treatment        Royce Macadamia M.Ed [employer] [phone]  02/02/2013

## 2013-02-03 ENCOUNTER — Other Ambulatory Visit: Payer: Self-pay | Admitting: *Deleted

## 2013-02-03 LAB — CULTURE, BLOOD (ROUTINE X 2): Culture: NO GROWTH

## 2013-02-03 MED ORDER — PREGABALIN 25 MG PO CAPS: ORAL_CAPSULE | ORAL | Status: DC

## 2013-02-04 NOTE — Progress Notes (Signed)
Date: 02/04/2013  MRN:  [id number] Name:  [name] Sex:  female Age:  67 y.o. DOB:10/08/1945                   Facility/Room;[facility] Level Of Care:SNF Provider: Dr. Murray Hodgkins   Emergency Contacts: Contact Information   Name Relation Home Work [place] Daughter   [phone]   [facility] Sister [phone]     Ernie Avena [phone]  [phone]      Code Status:Full code MOST Form:  Allergies:No Known Allergies   Chief Complaint  Patient presents with  . Medical Managment of Chronic Issues    Readmit to SNF following hospitalization for altered mental status     HPI:  67 y.o. female with pmh of HTN, DM, CKD stage 3, atrial fibrillation (on xarelto) and previous stroke; came to ED on 01/28/13,  from SNF due to acute onset of confusion. History is retrieved from ED records, patient family members at bedside and nursing home. Patient sent from nursing home for evaluation of altered mental status and elevated blood pressure. She is currently going through rehabilitation following a stroke and acutely stopped answering any questions and, at times, appears to be talking to people who aren't present in the room. No history of CP, cough, fever, abdominal pain, HA's, nausea, vomiting or any other acute complaints.  Per records and family members no new medications has been added to her regimen. In the ED CT, CXR and initial blood work unrevealing for abnormalities.  Patient was admitted to the hospitalist service. Neurology was consulted. TPA was not given secondary to a recent infarct and Xarelto therapy. MRI showed a small subcentimeter foci of acute infarction in the left occipital lobe Infarct felt to be embolic secondary to atrial fibrillation. On aspirin 81 mg orally every day and Xarelto prior to admission. Now on aspirin 81 mg daily and Xarelto for secondary stroke prevention. Patient with continued confusion - ammonia level within normal limits -  overall neuro exam is not consistent with size or location of stroke. EEG showed diffuse slowing but no epileptiform activity.? Baseline dementia prior to stroke has contributed. Patient was discharged 02/02/2013 to [facility] for rehab.    Past Medical History  Diagnosis Date  . Hypertension   . Diabetes mellitus   . Glaucoma   . Cataract     No past surgical history on file.   Procedures: 01/28/2013 . CT HEAD WITHOUT CONTRAST  IMPRESSION: Stable appearance of the brain. No acute findings.    01/27/2013  CT HEAD WITHOUT CONTRASTIMPRESSION: . Evolving area of encephalomalacia in the posterior left cerebral hemisphere, as above, related to prior left PCA territory infarction. 2. Smaller area of encephalomalacia in the posterior right parietal region as well.   01/28/2013 . MRI HEAD WITHOUT CONTRAST  IMPRESSION: Small subcentimeter foci of acute infarction in the left occipital lobe superimposed on a large chronic left PCA territory infarction as described above. Multiple foci of subacute to chronic hemorrhage related to prior infarction and hypertensive cerebrovascular disease. Atrophy with moderate to severe chronic microvascular ischemic change.     01/28/2013  PORTABLE CHEST  IMPRESSION: Limited study by poor inspiration. Mild basilar atelectasis. No segmental infiltrate or pulmonary edema.   6/19/2014PORTABLE CHEST -. IMPRESSION: Cardiomegaly with pulmonary vascular congestion   2D Echocardiogram EF 50-55% with no source of embolus.  Carotid Doppler April 2014 - bilateral mild soft plaque noted. No significant internal carotid or stenosis. Vertebral artery flow is antegrade.  EKG atrial fibrillation ventricular response 77 beats per minute  EEG negative for seizures, diffuse slowing  Consultants:  [name]   Current Outpatient Prescriptions  Medication Sig Dispense Refill  . aspirin 81 MG chewable tablet Chew 1 tablet (81 mg total) by mouth daily.      . cetirizine  (ZYRTEC) 10 MG tablet Take 10 mg by mouth daily.      . citalopram (CELEXA) 10 MG tablet Take 10 mg by mouth daily.      . cloNIDine (CATAPRES) 0.1 MG tablet Take 2 tablets (0.2 mg total) by mouth 2 (two) times daily.  60 tablet  11  . diltiazem (CARDIZEM CD) 360 MG 24 hr capsule Take 360 mg by mouth daily.      Marland Kitchen doxazosin (CARDURA) 1 MG tablet Take 1 mg by mouth at bedtime.      Marland Kitchen glimepiride (AMARYL) 1 MG tablet Take 1 tablet (1 mg total) by mouth daily before breakfast.      . Magnesium Hydroxide (MILK OF MAGNESIA PO) Take 30 mLs by mouth daily as needed (for constipation). Give if no BM in 3 days      . metoprolol succinate (TOPROL-XL) 100 MG 24 hr tablet Take 100 mg by mouth daily. Take with or immediately following a meal.      . polyethylene glycol (MIRALAX / GLYCOLAX) packet Take 17 g by mouth daily.      . pregabalin (LYRICA) 25 MG capsule Take 1 capsule (25 mg total) by mouth 2 (two) times daily.      . pregabalin (LYRICA) 25 MG capsule Take one capsule by mouth twice daily  60 capsule  5  . Rivaroxaban (XARELTO) 15 MG TABS tablet Take 1 tablet (15 mg total) by mouth daily with supper.  30 tablet    . traMADol (ULTRAM-ER) 100 MG 24 hr tablet Take 100 mg by mouth every 8 (eight) hours as needed for pain.       No current facility-administered medications for this visit.     There is no immunization history on file for this patient.   Diet:  History  Substance Use Topics  . Smoking status: Never Smoker   . Smokeless tobacco: Not on file  . Alcohol Use: No    Family History  Problem Relation Age of Onset  . Stroke Mother   . Diabetes type II Mother   . CAD Father        Vital signs: BP 172/98  Pulse 86  Resp 20  Ht 5\' 3"  (1.6 m)  Wt 226 lb 9.6 oz (102.785 kg)  BMI 40.15 kg/m2    General Appearance:    Alert, cooperative, no distress, appears stated age  Head:    Normocephalic, without obvious abnormality, atraumatic  Eyes:    PERRL, conjunctiva/corneas clear,  EOM's intact, fundi    benign, both eyes  Ears:    Normal TM's and external ear canals, both ears  Nose:   Nares normal, septum midline, mucosa normal, no drainage    or sinus tenderness  Throat:   Lips, mucosa, and tongue normal; teeth and gums normal  Neck:   Supple, symmetrical, trachea midline, no adenopathy;    thyroid:  no enlargement/tenderness/nodules; no carotid   bruit or JVD  Back:     Symmetric, no curvature, ROM normal, no CVA tenderness  Lungs:     Clear to auscultation bilaterally, respirations unlabored  Chest Wall:    No tenderness or deformity   Heart:  Regular rate and rhythm, S1 and S2 normal, no murmur, rub   or gallop  Breast Exam:    No tenderness, masses, or nipple abnormality  Abdomen:     Soft, non-tender, bowel sounds active all four quadrants,    no masses, no organomegaly  Genitalia:    Normal female without lesion, discharge or tenderness  Rectal:    Normal tone, normal prostate, no masses or tenderness;   guaiac negative stool  Extremities:   Extremities normal, atraumatic, no cyanosis or edema  Pulses:   2+ and symmetric all extremities  Skin:   Skin color, texture, turgor normal, no rashes or lesions  Lymph nodes:   Cervical, supraclavicular, and axillary nodes normal  Neurologic:   CNII-XII intact, normal strength, sensation and reflexes    throughout    Screening Score  MMS    PHQ2    PHQ9     Fall Risk    BIMS     Admission on 01/28/2013, Discharged on 02/02/2013  Component Date Value Range Status  . Color, Urine 01/28/2013 YELLOW  YELLOW Final  . APPearance 01/28/2013 CLEAR  CLEAR Final  . Specific Gravity, Urine 01/28/2013 1.014  1.005 - 1.030 Final  . pH 01/28/2013 7.0  5.0 - 8.0 Final  . Glucose, UA 01/28/2013 NEGATIVE  NEGATIVE mg/dL Final  . Hgb urine dipstick 01/28/2013 SMALL* NEGATIVE Final  . Bilirubin Urine 01/28/2013 NEGATIVE  NEGATIVE Final  . Ketones, ur 01/28/2013 15* NEGATIVE mg/dL Final  . Protein, ur 82/95/6213 >300*  NEGATIVE mg/dL Final  . Urobilinogen, UA 01/28/2013 0.2  0.0 - 1.0 mg/dL Final  . Nitrite 08/65/7846 NEGATIVE  NEGATIVE Final  . Leukocytes, UA 01/28/2013 NEGATIVE  NEGATIVE Final  . WBC 01/28/2013 6.9  4.0 - 10.5 K/uL Final  . RBC 01/28/2013 4.71  3.87 - 5.11 MIL/uL Final  . Hemoglobin 01/28/2013 13.5  12.0 - 15.0 g/dL Final  . HCT 96/29/5284 40.2  36.0 - 46.0 % Final  . MCV 01/28/2013 85.4  78.0 - 100.0 fL Final  . MCH 01/28/2013 28.7  26.0 - 34.0 pg Final  . MCHC 01/28/2013 33.6  30.0 - 36.0 g/dL Final  . RDW 13/24/4010 13.1  11.5 - 15.5 % Final  . Platelets 01/28/2013 349  150 - 400 K/uL Final  . Neutrophils Relative % 01/28/2013 65  43 - 77 % Final  . Neutro Abs 01/28/2013 4.5  1.7 - 7.7 K/uL Final  . Lymphocytes Relative 01/28/2013 22  12 - 46 % Final  . Lymphs Abs 01/28/2013 1.5  0.7 - 4.0 K/uL Final  . Monocytes Relative 01/28/2013 12  3 - 12 % Final  . Monocytes Absolute 01/28/2013 0.8  0.1 - 1.0 K/uL Final  . Eosinophils Relative 01/28/2013 1  0 - 5 % Final  . Eosinophils Absolute 01/28/2013 0.1  0.0 - 0.7 K/uL Final  . Basophils Relative 01/28/2013 0  0 - 1 % Final  . Basophils Absolute 01/28/2013 0.0  0.0 - 0.1 K/uL Final  . Sodium 01/28/2013 137  135 - 145 mEq/L Final  . Potassium 01/28/2013 3.7  3.5 - 5.1 mEq/L Final  . Chloride 01/28/2013 100  96 - 112 mEq/L Final  . CO2 01/28/2013 28  19 - 32 mEq/L Final  . Glucose, Bld 01/28/2013 173* 70 - 99 mg/dL Final  . BUN 27/25/3664 18  6 - 23 mg/dL Final  . Creatinine, Ser 01/28/2013 1.25* 0.50 - 1.10 mg/dL Final  . Calcium 40/34/7425 9.6  8.4 - 10.5 mg/dL  Final  . Total Protein 01/28/2013 7.6  6.0 - 8.3 g/dL Final  . Albumin 16/05/9603 3.0* 3.5 - 5.2 g/dL Final  . AST 54/04/8118 12  0 - 37 U/L Final  . ALT 01/28/2013 17  0 - 35 U/L Final  . Alkaline Phosphatase 01/28/2013 69  39 - 117 U/L Final  . Total Bilirubin 01/28/2013 0.5  0.3 - 1.2 mg/dL Final  . GFR calc non Af Amer 01/28/2013 44* >90 mL/min Final  . GFR calc Af  Amer 01/28/2013 52* >90 mL/min Final   Comment:                                 The eGFR has been calculated                          using the CKD EPI equation.                          This calculation has not been                          validated in all clinical                          situations.                          eGFR's persistently                          <90 mL/min signify                          possible Chronic Kidney Disease.  Marland Kitchen Hemoglobin A1C 01/28/2013 7.4* <5.7 % Final   Comment: (NOTE)                                                                                                                         According to the ADA Clinical Practice Recommendations for 2011, when                          HbA1c is used as a screening test:                           >=6.5%   Diagnostic of Diabetes Mellitus                                    (if abnormal result is confirmed)  5.7-6.4%   Increased risk of developing Diabetes Mellitus                          References:Diagnosis and Classification of Diabetes Mellitus,Diabetes                          Care,2011,34(Suppl 1):S62-S69 and Standards of Medical Care in                                  Diabetes - 2011,Diabetes Care,2011,34 (Suppl 1):S11-S61.  . Mean Plasma Glucose 01/28/2013 166* <117 mg/dL Final  . Opiates 16/05/9603 NONE DETECTED  NONE DETECTED Final  . Cocaine 01/28/2013 NONE DETECTED  NONE DETECTED Final  . Benzodiazepines 01/28/2013 NONE DETECTED  NONE DETECTED Final  . Amphetamines 01/28/2013 NONE DETECTED  NONE DETECTED Final  . Tetrahydrocannabinol 01/28/2013 NONE DETECTED  NONE DETECTED Final  . Barbiturates 01/28/2013 NONE DETECTED  NONE DETECTED Final   Comment:                                 DRUG SCREEN FOR MEDICAL PURPOSES                          ONLY.  IF CONFIRMATION IS NEEDED                          FOR ANY PURPOSE, NOTIFY LAB                          WITHIN 5 DAYS.                                                           LOWEST DETECTABLE LIMITS                          FOR URINE DRUG SCREEN                          Drug Class       Cutoff (ng/mL)                          Amphetamine      1000                          Barbiturate      200                          Benzodiazepine   200                          Tricyclics       300                          Opiates          300  Cocaine          300                          THC              50  . Vitamin B-12 01/28/2013 1561* 211 - 911 pg/mL Final  . TSH 01/28/2013 1.536  0.350 - 4.500 uIU/mL Final  . Free T4 01/28/2013 1.45  0.80 - 1.80 ng/dL Final  . T3, Free 91/47/8295 2.4  2.3 - 4.2 pg/mL Final  . Cholesterol 01/29/2013 116  0 - 200 mg/dL Final  . Triglycerides 01/29/2013 71  <150 mg/dL Final  . HDL 62/13/0865 74  >39 mg/dL Final  . Total CHOL/HDL Ratio 01/29/2013 1.6   Final  . VLDL 01/29/2013 14  0 - 40 mg/dL Final  . LDL Cholesterol 01/29/2013 28  0 - 99 mg/dL Final   Comment:                                 Total Cholesterol/HDL:CHD Risk                          Coronary Heart Disease Risk Table                                              Men   Women                           1/2 Average Risk   3.4   3.3                           Average Risk       5.0   4.4                           2 X Average Risk   9.6   7.1                           3 X Average Risk  23.4   11.0                                                          Use the calculated Patient Ratio                          above and the CHD Risk Table                          to determine the patient's CHD Risk.  ATP III CLASSIFICATION (LDL):                           <100     mg/dL   Optimal                           100-129  mg/dL   Near or Above                                             Optimal                           130-159  mg/dL    Borderline                           160-189  mg/dL   High                           >190     mg/dL   Very High  . Glucose-Capillary 01/28/2013 153* 70 - 99 mg/dL Final  . Glucose-Capillary 01/28/2013 134* 70 - 99 mg/dL Final  . Comment 1 08/13/7251 Documented in Chart   Final  . Comment 2 01/28/2013 Notify RN   Final  . WBC, UA 01/28/2013 0-2  <3 WBC/hpf Final  . RBC / HPF 01/28/2013 3-6  <3 RBC/hpf Final  . Bacteria, UA 01/28/2013 RARE  RARE Final  . Casts 01/28/2013 HYALINE CASTS* NEGATIVE Final  . Urine-Other 01/28/2013 MUCOUS PRESENT   Final  . Glucose-Capillary 01/29/2013 135* 70 - 99 mg/dL Final  . Comment 1 66/44/0347 Notify RN   Final  . Comment 2 01/29/2013 Documented in Chart   Final  . Ammonia 01/29/2013 18  11 - 60 umol/L Final  . Glucose-Capillary 01/29/2013 165* 70 - 99 mg/dL Final  . Glucose-Capillary 01/29/2013 160* 70 - 99 mg/dL Final  . Glucose-Capillary 01/29/2013 123* 70 - 99 mg/dL Final  . Glucose-Capillary 01/30/2013 156* 70 - 99 mg/dL Final  . Glucose-Capillary 01/30/2013 159* 70 - 99 mg/dL Final  . Glucose-Capillary 01/30/2013 143* 70 - 99 mg/dL Final  . Glucose-Capillary 01/30/2013 182* 70 - 99 mg/dL Final  . Glucose-Capillary 01/31/2013 139* 70 - 99 mg/dL Final  . Comment 1 42/59/5638 Documented in Chart   Final  . Comment 2 01/31/2013 Notify RN   Final  . Glucose-Capillary 01/31/2013 165* 70 - 99 mg/dL Final  . Glucose-Capillary 01/31/2013 167* 70 - 99 mg/dL Final  . Glucose-Capillary 01/31/2013 136* 70 - 99 mg/dL Final  . Glucose-Capillary 02/01/2013 151* 70 - 99 mg/dL Final  . Glucose-Capillary 02/01/2013 196* 70 - 99 mg/dL Final  . Comment 1 75/64/3329 Documented in Chart   Final  . Comment 2 02/01/2013 Notify RN   Final  . Sodium 02/01/2013 136  135 - 145 mEq/L Final  . Potassium 02/01/2013 4.2  3.5 - 5.1 mEq/L Final  . Chloride 02/01/2013 101  96 - 112 mEq/L Final  . CO2 02/01/2013 26  19 - 32 mEq/L Final  . Glucose, Bld 02/01/2013 178* 70 - 99  mg/dL Final  . BUN 51/88/4166 22  6 - 23 mg/dL Final  . Creatinine, Ser 02/01/2013 1.44* 0.50 - 1.10 mg/dL Final  .  Calcium 02/01/2013 9.0  8.4 - 10.5 mg/dL Final  . Total Protein 02/01/2013 7.1  6.0 - 8.3 g/dL Final  . Albumin 16/05/9603 2.7* 3.5 - 5.2 g/dL Final  . AST 54/04/8118 15  0 - 37 U/L Final  . ALT 02/01/2013 19  0 - 35 U/L Final  . Alkaline Phosphatase 02/01/2013 61  39 - 117 U/L Final  . Total Bilirubin 02/01/2013 0.4  0.3 - 1.2 mg/dL Final  . GFR calc non Af Amer 02/01/2013 37* >90 mL/min Final  . GFR calc Af Amer 02/01/2013 43* >90 mL/min Final   Comment:                                 The eGFR has been calculated                          using the CKD EPI equation.                          This calculation has not been                          validated in all clinical                          situations.                          eGFR's persistently                          <90 mL/min signify                          possible Chronic Kidney Disease.  . Glucose-Capillary 02/01/2013 147* 70 - 99 mg/dL Final  . Comment 1 14/78/2956 Documented in Chart   Final  . Comment 2 02/01/2013 Notify RN   Final  . Glucose-Capillary 02/01/2013 125* 70 - 99 mg/dL Final  . Comment 1 21/30/8657 Documented in Chart   Final  . Comment 2 02/01/2013 Notify RN   Final  . Glucose-Capillary 02/02/2013 152* 70 - 99 mg/dL Final  . Comment 1 84/69/6295 Documented in Chart   Final  . Comment 2 02/02/2013 Notify RN   Final  . Glucose-Capillary 02/02/2013 195* 70 - 99 mg/dL Final  . Glucose-Capillary 02/02/2013 220* 70 - 99 mg/dL Final  . Comment 1 28/41/3244 Notify RN   Final  Admission on 01/27/2013, Discharged on 01/28/2013  Component Date Value Range Status  . Specimen Description 01/27/2013 BLOOD ARM RIGHT   Final  . Special Requests 01/27/2013 BOTTLES DRAWN AEROBIC ONLY 5CC   Final  . Culture  Setup Time 01/27/2013 01/28/2013 01:09   Final  . Culture 01/27/2013 NO GROWTH 5 DAYS   Final   . Report Status 01/27/2013 02/03/2013 FINAL   Final  . Specimen Description 01/27/2013 BLOOD HAND RIGHT   Final  . Special Requests 01/27/2013 BOTTLES DRAWN AEROBIC ONLY 3CC   Final  . Culture  Setup Time 01/27/2013 01/28/2013 01:10   Final  . Culture 01/27/2013 NO GROWTH 5 DAYS   Final  . Report Status 01/27/2013 02/03/2013 FINAL   Final  . WBC 01/27/2013 7.0  4.0 - 10.5 K/uL  Final  . RBC 01/27/2013 4.82  3.87 - 5.11 MIL/uL Final  . Hemoglobin 01/27/2013 13.8  12.0 - 15.0 g/dL Final  . HCT 40/98/1191 41.4  36.0 - 46.0 % Final  . MCV 01/27/2013 85.9  78.0 - 100.0 fL Final  . MCH 01/27/2013 28.6  26.0 - 34.0 pg Final  . MCHC 01/27/2013 33.3  30.0 - 36.0 g/dL Final  . RDW 47/82/9562 13.0  11.5 - 15.5 % Final  . Platelets 01/27/2013 353  150 - 400 K/uL Final  . Neutrophils Relative % 01/27/2013 81* 43 - 77 % Final  . Neutro Abs 01/27/2013 5.7  1.7 - 7.7 K/uL Final  . Lymphocytes Relative 01/27/2013 16  12 - 46 % Final  . Lymphs Abs 01/27/2013 1.1  0.7 - 4.0 K/uL Final  . Monocytes Relative 01/27/2013 2* 3 - 12 % Final  . Monocytes Absolute 01/27/2013 0.1  0.1 - 1.0 K/uL Final  . Eosinophils Relative 01/27/2013 1  0 - 5 % Final  . Eosinophils Absolute 01/27/2013 0.0  0.0 - 0.7 K/uL Final  . Basophils Relative 01/27/2013 0  0 - 1 % Final  . Basophils Absolute 01/27/2013 0.0  0.0 - 0.1 K/uL Final  . Procalcitonin 01/27/2013 <0.10   Final   Comment:                                 Interpretation:                          PCT (Procalcitonin) <= 0.5 ng/mL:                          Systemic infection (sepsis) is not likely.                          Local bacterial infection is possible.                          REPEATED TO VERIFY                          (NOTE)                                  ICU PCT Algorithm               Non ICU PCT Algorithm                             ----------------------------     ------------------------------                                  PCT < 0.25 ng/mL                  PCT < 0.1 ng/mL                              Stopping of antibiotics            Stopping of antibiotics  strongly encouraged.               strongly encouraged.                             ----------------------------     ------------------------------                                PCT level decrease by               PCT < 0.25 ng/mL                                >= 80% from peak PCT                                OR PCT 0.25 - 0.5 ng/mL          Stopping of antibiotics                                                                      encouraged.                              Stopping of antibiotics                                    encouraged.                             ----------------------------     ------------------------------                                PCT level decrease by              PCT >= 0.25 ng/mL                                < 80% from peak PCT                                 AND PCT >= 0.5 ng/mL            Continuing antibiotics                                                                       encouraged.  Continuing antibiotics                                     encouraged.                             ----------------------------     ------------------------------                              PCT level increase compared          PCT > 0.5 ng/mL                                  with peak PCT AND                                   PCT >= 0.5 ng/mL             Escalation of antibiotics                                                                   strongly encouraged.                               Escalation of antibiotics                                 strongly encouraged.  . Color, Urine 01/27/2013 YELLOW  YELLOW Final  . APPearance 01/27/2013 CLEAR  CLEAR Final  . Specific Gravity, Urine 01/27/2013 1.019  1.005 - 1.030 Final  . pH 01/27/2013 6.5  5.0 - 8.0 Final  . Glucose, UA  01/27/2013 100* NEGATIVE mg/dL Final  . Hgb urine dipstick 01/27/2013 MODERATE* NEGATIVE Final  . Bilirubin Urine 01/27/2013 NEGATIVE  NEGATIVE Final  . Ketones, ur 01/27/2013 15* NEGATIVE mg/dL Final  . Protein, ur 16/05/9603 >300* NEGATIVE mg/dL Final  . Urobilinogen, UA 01/27/2013 0.2  0.0 - 1.0 mg/dL Final  . Nitrite 54/04/8118 NEGATIVE  NEGATIVE Final  . Leukocytes, UA 01/27/2013 NEGATIVE  NEGATIVE Final  . Specimen Description 01/27/2013 URINE, CATHETERIZED   Final  . Special Requests 01/27/2013 NONE   Final  . Culture  Setup Time 01/27/2013 01/27/2013 20:43   Final  . Colony Count 01/27/2013 NO GROWTH   Final  . Culture 01/27/2013 NO GROWTH   Final  . Report Status 01/27/2013 01/28/2013 FINAL   Final  . pH, Arterial 01/27/2013 7.406  7.350 - 7.450 Final  . pCO2 arterial 01/27/2013 43.9  35.0 - 45.0 mmHg Final  . pO2, Arterial 01/27/2013 64.0* 80.0 - 100.0 mmHg Final  . Bicarbonate 01/27/2013 27.6* 20.0 - 24.0 mEq/L Final  . TCO2 01/27/2013 29  0 - 100 mmol/L Final  . O2 Saturation 01/27/2013 92.0   Final  . Acid-Base Excess 01/27/2013 2.0  0.0 - 2.0 mmol/L Final  . Patient temperature 01/27/2013 98.6  F   Final  . Collection site 01/27/2013 RADIAL, ALLEN'S TEST ACCEPTABLE   Final  . Drawn by 01/27/2013 RT   Final  . Sample type 01/27/2013 ARTERIAL   Final  . Glucose-Capillary 01/27/2013 187* 70 - 99 mg/dL Final  . Comment 1 95/28/4132 Documented in Chart   Final  . Comment 2 01/27/2013 Notify RN   Final  . Squamous Epithelial / LPF 01/27/2013 RARE  RARE Final  . WBC, UA 01/27/2013 0-2  <3 WBC/hpf Final  . RBC / HPF 01/27/2013 7-10  <3 RBC/hpf Final  . Bacteria, UA 01/27/2013 FEW* RARE Final  . Casts 01/27/2013 HYALINE CASTS* NEGATIVE Final  . Troponin i, poc 01/27/2013 0.00  0.00 - 0.08 ng/mL Final  . Comment 3 01/27/2013          Final   Comment: Due to the release kinetics of cTnI,                          a negative result within the first hours                           of the onset of symptoms does not rule out                          myocardial infarction with certainty.                          If myocardial infarction is still suspected,                          repeat the test at appropriate intervals.  . Lactic Acid, Venous 01/27/2013 1.76  0.5 - 2.2 mmol/L Final     Annual summary: Hospitalization:   Infection History:  Functional assessment: Areas of potential improvement: Rehabilitation Potential: Prognosis for survival: Plan:  This encounter was created in error - please disregard.

## 2013-03-01 ENCOUNTER — Non-Acute Institutional Stay (SKILLED_NURSING_FACILITY): Payer: PRIVATE HEALTH INSURANCE | Admitting: Nurse Practitioner

## 2013-03-01 ENCOUNTER — Encounter: Payer: Self-pay | Admitting: Nurse Practitioner

## 2013-03-01 DIAGNOSIS — I6350 Cerebral infarction due to unspecified occlusion or stenosis of unspecified cerebral artery: Secondary | ICD-10-CM

## 2013-03-01 DIAGNOSIS — E119 Type 2 diabetes mellitus without complications: Secondary | ICD-10-CM

## 2013-03-01 DIAGNOSIS — I10 Essential (primary) hypertension: Secondary | ICD-10-CM

## 2013-03-01 DIAGNOSIS — I639 Cerebral infarction, unspecified: Secondary | ICD-10-CM

## 2013-03-01 NOTE — Progress Notes (Signed)
Patient ID: [name], female   DOB: 10-06-1945, 67 y.o.   MRN: [id number]  Nursing Home Location:  [facility]   Place of Service: SNF (31)  Chief Complaint  Patient presents with  . Medical Managment of Chronic Issues    HPI:  67 y.o.female with a PMH of hypertension, diabetes mellitus with peripheral neuropathy with recent large acute left PCA infarct as well as small acute right PCA cortical infarct. currently at [facility] for rehab. Pt has been doing well. Pt has complained more of left knee pain. Pt reports this has been ongoing for 20 years and is due to arthritis- worse due to increase use with therapies; when given PRN tramadol this helps. Pt is without any other complaints and staff without concerns at this time.     Review of Systems:  Limited due to being a poor historian and aphagia.  Review of Systems  Constitutional: Negative for malaise/fatigue.  Respiratory: Negative for shortness of breath.   Cardiovascular: Negative for chest pain.  Gastrointestinal: Negative for heartburn, abdominal pain, diarrhea and constipation.  Genitourinary: Negative for dysuria, urgency and frequency.  Musculoskeletal: Positive for joint pain.  Skin: Negative.   Neurological: Negative for dizziness, weakness and headaches.  Psychiatric/Behavioral: Positive for memory loss. Negative for depression. The patient is not nervous/anxious and does not have insomnia.     Medications: Patient's Medications  New Prescriptions   No medications on file  Previous Medications   ASPIRIN 81 MG CHEWABLE TABLET    Chew 1 tablet (81 mg total) by mouth daily.   CETIRIZINE (ZYRTEC) 10 MG TABLET    Take 10 mg by mouth daily.   CITALOPRAM (CELEXA) 10 MG TABLET    Take 10 mg by mouth daily.   CLONIDINE (CATAPRES) 0.1 MG TABLET    Take 2 tablets (0.2 mg total) by mouth 2 (two) times daily.   DILTIAZEM (CARDIZEM CD) 360 MG 24 HR CAPSULE    Take 360 mg by mouth daily.   DOXAZOSIN (CARDURA) 1 MG  TABLET    Take 1 mg by mouth at bedtime.   GLIMEPIRIDE (AMARYL) 1 MG TABLET    Take 1 tablet (1 mg total) by mouth daily before breakfast.   MAGNESIUM HYDROXIDE (MILK OF MAGNESIA PO)    Take 30 mLs by mouth daily as needed (for constipation). Give if no BM in 3 days   METOPROLOL SUCCINATE (TOPROL-XL) 100 MG 24 HR TABLET    Take 100 mg by mouth daily. Take with or immediately following a meal.   POLYETHYLENE GLYCOL (MIRALAX / GLYCOLAX) PACKET    Take 17 g by mouth daily.   PREGABALIN (LYRICA) 25 MG CAPSULE    Take one capsule by mouth twice daily   RIVAROXABAN (XARELTO) 15 MG TABS TABLET    Take 1 tablet (15 mg total) by mouth daily with supper.   TRAMADOL (ULTRAM-ER) 100 MG 24 HR TABLET    Take 100 mg by mouth every 8 (eight) hours as needed for pain.  Modified Medications   No medications on file  Discontinued Medications   PREGABALIN (LYRICA) 25 MG CAPSULE    Take 1 capsule (25 mg total) by mouth 2 (two) times daily.     Physical Exam:  Filed Vitals:   03/01/13 1549  BP: 123/84  Pulse: 60  Temp: 98.7 F (37.1 C)  Resp: 16   Physical Exam  Constitutional: She appears well-developed and well-nourished. No distress.  HENT:  Head: Normocephalic and atraumatic.  Eyes:  Conjunctivae and EOM are normal. Pupils are equal, round, and reactive to light.  Cardiovascular: Normal rate, regular rhythm and normal heart sounds.  Pulmonary/Chest: Breath sounds normal.  Abdominal: Soft. Bowel sounds are normal. She exhibits no distension. There is no tenderness.  Musculoskeletal: She exhibits no edema and no tenderness. Limited ROM in lower extremities.  Neurological: She is alert.  Skin: Skin is dry. She is not diaphoretic.   Assessment/Plan 1. CKD (chronic kidney disease) stage 3, GFR 30-59 ml/min Will follow up bmp  2. CVA (cerebral infarction) Stable; conts to work with therapies   3. Hypertension Patients blood pressure is stable; continue current regimen. Will monitor and make  changes as necessary.  4. Diabetes mellitus, controlled Stable with current medication  5. Osteoarthrosis With worsening knee pain; PRNs have helped  Labs/tests ordered CBC and CMP not obtained from previous order- will reorder at this time.

## 2013-03-24 ENCOUNTER — Non-Acute Institutional Stay (SKILLED_NURSING_FACILITY): Payer: PRIVATE HEALTH INSURANCE | Admitting: Nurse Practitioner

## 2013-03-24 DIAGNOSIS — K5900 Constipation, unspecified: Secondary | ICD-10-CM

## 2013-03-24 DIAGNOSIS — G609 Hereditary and idiopathic neuropathy, unspecified: Secondary | ICD-10-CM

## 2013-03-24 DIAGNOSIS — D649 Anemia, unspecified: Secondary | ICD-10-CM

## 2013-03-24 DIAGNOSIS — M159 Polyosteoarthritis, unspecified: Secondary | ICD-10-CM

## 2013-03-24 DIAGNOSIS — I10 Essential (primary) hypertension: Secondary | ICD-10-CM

## 2013-03-24 DIAGNOSIS — E1149 Type 2 diabetes mellitus with other diabetic neurological complication: Secondary | ICD-10-CM

## 2013-03-24 NOTE — Progress Notes (Signed)
Patient ID: [name], female   DOB: 1946/01/04, 67 y.o.   MRN: [id number]  Nursing Home Location:  [facility]   Place of Service: SNF (31)  Chief Complaint  Patient presents with  . Medical Managment of Chronic Issues    HPI:  67 y.o.female with a PMH of hypertension, diabetes mellitus with peripheral neuropathy with recent large acute left PCA infarct as well as small acute right PCA cortical infarct. currently at [facility] for rehab. Pt has been doing well. However medication for her arthritis pain is not lasting her long enough. Currently taking tramadol which does help.   Review of Systems:  Limited due to being a poor historian and aphagia.  Review of Systems  Constitutional: Negative for malaise/fatigue.  Respiratory: Negative for shortness of breath.  Cardiovascular: Negative for chest pain.  Gastrointestinal: Negative for heartburn, abdominal pain, diarrhea and constipation.  Genitourinary: Negative for dysuria, urgency and frequency.  Musculoskeletal: Positive for joint pain.  Skin: Negative.  Neurological: Negative for dizziness, weakness and headaches.  Psychiatric/Behavioral: Positive for memory loss. Negative for depression. The patient is not nervous/anxious and does not have insomnia.   Medications: Patient's Medications  New Prescriptions   No medications on file  Previous Medications   ASPIRIN 81 MG CHEWABLE TABLET    Chew 1 tablet (81 mg total) by mouth daily.   CETIRIZINE (ZYRTEC) 10 MG TABLET    Take 10 mg by mouth daily.   CITALOPRAM (CELEXA) 10 MG TABLET    Take 10 mg by mouth daily.   CLONIDINE (CATAPRES) 0.1 MG TABLET    Take 2 tablets (0.2 mg total) by mouth 2 (two) times daily.   DILTIAZEM (CARDIZEM CD) 360 MG 24 HR CAPSULE    Take 360 mg by mouth daily.   DOXAZOSIN (CARDURA) 1 MG TABLET    Take 1 mg by mouth at bedtime.   GLIMEPIRIDE (AMARYL) 1 MG TABLET    Take 1 tablet (1 mg total) by mouth daily before breakfast.   MAGNESIUM  HYDROXIDE (MILK OF MAGNESIA PO)    Take 30 mLs by mouth daily as needed (for constipation). Give if no BM in 3 days   METOPROLOL SUCCINATE (TOPROL-XL) 100 MG 24 HR TABLET    Take 100 mg by mouth daily. Take with or immediately following a meal.   POLYETHYLENE GLYCOL (MIRALAX / GLYCOLAX) PACKET    Take 17 g by mouth daily.   PREGABALIN (LYRICA) 25 MG CAPSULE    Take one capsule by mouth twice daily   RIVAROXABAN (XARELTO) 15 MG TABS TABLET    Take 1 tablet (15 mg total) by mouth daily with supper.   TRAMADOL (ULTRAM-ER) 100 MG 24 HR TABLET    Take 100 mg by mouth every 8 (eight) hours as needed for pain.  Modified Medications   No medications on file  Discontinued Medications   No medications on file     Physical Exam:  Filed Vitals:   03/24/13 1457  BP: 132/68  Pulse: 72  Temp: 97.8 F (36.6 C)  Resp: 18  Weight: 234 lb (106.142 kg)     GENERAL APPEARANCE: Alert, conversant. Appropriately groomed. No acute distress.  SKIN: No diaphoresis rash, or wounds HEAD: Normocephalic, atraumatic  EYES: Conjunctiva/lids clear. Pupils round, reactive. EOMs intact.  EARS: External exam WNL. Hearing grossly normal.  NOSE: No deformity or discharge.  MOUTH/THROAT: Lips w/o lesions. Mouth and throat normal. Tongue moist, w/o lesion.  NECK: No thyroid tenderness, enlargement or nodule  RESPIRATORY: Breathing is even, unlabored. Lung sounds are clear   CARDIOVASCULAR: Heart RRR no murmurs, rubs or gallops. ARTERIAL: radial pulse 2+ GASTROINTESTINAL: Abdomen is soft, non-tender, not distended w/ normal bowel sounds. GENITOURINARY: Bladder non tender, not distended  MUSCULOSKELETAL: weak LE  NEUROLOGIC: alert  Labs reviewed/Significant Diagnostic Results: CBC with Diff       Result: 03/02/2013 2:17 PM    ( Status: F )       C     WBC  4.9        4.0-10.5  K/uL  SLN       RBC  3.73     L  4.22-5.81  MIL/uL  SLN       Hemoglobin  10.4     L  13.0-17.0  g/dL  SLN       Hematocrit  31.3     L   39.0-52.0  %  SLN       MCV  83.9        78.0-100.0  fL  SLN       MCH  27.9        26.0-34.0  pg  SLN       MCHC  33.2        30.0-36.0  g/dL  SLN       RDW  40.9        11.5-15.5  %  SLN       Platelet Count  343        150-400  K/uL  SLN       Granulocyte %  48        43-77  %  SLN       Absolute Gran  2.4        1.7-7.7  K/uL  SLN       Lymph %  32        12-46  %  SLN       Absolute Lymph  1.6        0.7-4.0  K/uL  SLN       Mono %  14     H  3-12  %  SLN       Absolute Mono  0.7        0.1-1.0  K/uL  SLN       Eos %  6     H  0-5  %  SLN       Absolute Eos  0.3        0.0-0.7  K/uL  SLN       Baso %  0        0-1  %  SLN       Absolute Baso  0.0        0.0-0.1  K/uL  SLN       Smear Review  Criteria for review not met   SLN      Comprehensive Metabolic Panel       Result: 03/02/2013 3:38 PM    ( Status: F )            Sodium  139        135-145  mEq/L  SLN       Potassium  4.1        3.5-5.3  mEq/L  SLN       Chloride  104        96-112  mEq/L  SLN       CO2  28        19-32  mEq/L  SLN       Glucose  136     H  70-99  mg/dL  SLN       BUN  27     H  6-23  mg/dL  SLN       Creatinine  1.39     H  0.50-1.35  mg/dL  SLN       Bilirubin, Total  0.4        0.3-1.2  mg/dL  SLN       Alkaline Phosphatase  78        39-117  U/L  SLN       AST/SGOT  13        0-37  U/L  SLN       ALT/SGPT  20        0-53  U/L  SLN       Total Protein  6.2        6.0-8.3  g/dL  SLN       Albumin  2.8     L  3.5-5.2  g/dL  SLN       Calcium  8.6        8.4-10.5  mg/dL  SLN            Assessment/Plan 1. Hypertension Blood pressure have been stable  2. Diabetes with neurological manifestations(250.6) Have been stable- will check blood sugars q am  3. Anemia Ongoing; Will have staff hemacult stools x3, dc asa (pt on xarelto) and recheck cbc in 2 weeks  4. Unspecified constipation Currently on miralax will add colace 100 m g BID   5. Unspecified hereditary and idiopathic peripheral  neuropathy Stable  6. Generalized OA Worse; freq in pain. Will schedule tramadol 50 mg TID and have tylenol 650 mg q 6 hours as needed

## 2013-03-28 ENCOUNTER — Other Ambulatory Visit: Payer: Self-pay | Admitting: Geriatric Medicine

## 2013-03-28 MED ORDER — TRAMADOL HCL ER 100 MG PO TB24: 100.0000 mg | ORAL_TABLET | Freq: Three times a day (TID) | ORAL | Status: DC | PRN

## 2013-04-19 ENCOUNTER — Non-Acute Institutional Stay (SKILLED_NURSING_FACILITY): Payer: PRIVATE HEALTH INSURANCE | Admitting: Nurse Practitioner

## 2013-04-19 ENCOUNTER — Encounter: Payer: Self-pay | Admitting: Nurse Practitioner

## 2013-04-19 DIAGNOSIS — K219 Gastro-esophageal reflux disease without esophagitis: Secondary | ICD-10-CM

## 2013-04-19 DIAGNOSIS — M1990 Unspecified osteoarthritis, unspecified site: Secondary | ICD-10-CM

## 2013-04-19 DIAGNOSIS — D649 Anemia, unspecified: Secondary | ICD-10-CM

## 2013-04-19 DIAGNOSIS — K5900 Constipation, unspecified: Secondary | ICD-10-CM

## 2013-04-19 NOTE — Progress Notes (Signed)
Patient ID: [name], female   DOB: 01-Oct-1945, 67 y.o.   MRN: [id number]  Nursing Home Location:  [facility]   Place of Service: SNF (31)  Chief Complaint  Patient presents with  . Medical Managment of Chronic Issues    HPI:  67 y.o.female with a PMH of hypertension, diabetes mellitus with peripheral neuropathy with recent large acute left PCA infarct as well as small acute right PCA cortical infarct. currently at [facility] for rehab. Pt has been doing well. Last month pts pain medication was adjusted; pt reports this has helped; now she reports increase in GERD However medication for her arthritis pain is not lasting her long enough. Currently taking tramadol which does help. Staff without any concerns at this time.   Review of Systems:   DATA OBTAINED: from patient, nurse, medical record GENERAL: Feels well no fevers, fatigue, appetite changes SKIN: No itching, rash or wounds MOUTH/THROAT: No mouth or tooth pain, No sore throat, No difficulty chewing or swallowing  RESPIRATORY: No cough, wheezing, SOB CARDIAC: No chest pain, palpitations, lower extremity edema  GI: No abdominal pain, No N/V/D or constipation, has worsening heartburn   GU: No dysuria, frequency or urgency MUSCULOSKELETAL: as freq knee pain NEUROLOGIC: Awake, alert, appropriate to situation, No change in mental status. Moves all four, no focal deficits PSYCHIATRIC: No overt anxiety or sadness. Sleeps well. No behavior issue.  AMBULATION:  WC   Medications: Patient's Medications  New Prescriptions   No medications on file  Previous Medications   ASPIRIN 81 MG CHEWABLE TABLET    Chew 1 tablet (81 mg total) by mouth daily.   CETIRIZINE (ZYRTEC) 10 MG TABLET    Take 10 mg by mouth daily.   CITALOPRAM (CELEXA) 10 MG TABLET    Take 10 mg by mouth daily.   CLONIDINE (CATAPRES) 0.1 MG TABLET    Take 2 tablets (0.2 mg total) by mouth 2 (two) times daily.   DILTIAZEM (CARDIZEM CD) 360 MG 24 HR CAPSULE     Take 360 mg by mouth daily.   DOXAZOSIN (CARDURA) 1 MG TABLET    Take 1 mg by mouth at bedtime.   GLIMEPIRIDE (AMARYL) 1 MG TABLET    Take 1 tablet (1 mg total) by mouth daily before breakfast.   MAGNESIUM HYDROXIDE (MILK OF MAGNESIA PO)    Take 30 mLs by mouth daily as needed (for constipation). Give if no BM in 3 days   METOPROLOL SUCCINATE (TOPROL-XL) 100 MG 24 HR TABLET    Take 100 mg by mouth daily. Take with or immediately following a meal.   POLYETHYLENE GLYCOL (MIRALAX / GLYCOLAX) PACKET    Take 17 g by mouth daily.   PREGABALIN (LYRICA) 25 MG CAPSULE    Take one capsule by mouth twice daily   RIVAROXABAN (XARELTO) 15 MG TABS TABLET    Take 1 tablet (15 mg total) by mouth daily with supper.   TRAMADOL (ULTRAM) 50 MG TABLET    Take 50 mg by mouth every 8 (eight) hours.  Modified Medications   No medications on file  Discontinued Medications   TRAMADOL (ULTRAM-ER) 100 MG 24 HR TABLET    Take 1 tablet (100 mg total) by mouth every 8 (eight) hours as needed for pain.     Physical Exam:  Filed Vitals:   04/19/13 1603  BP: 131/70  Pulse: 75  Temp: 97.9 F (36.6 C)  Resp: 20  Weight: 234 lb (106.142 kg)  GENERAL APPEARANCE: Alert, conversant.  Appropriately groomed. No acute distress.  SKIN: No diaphoresis rash, or wounds  RESPIRATORY: Breathing is even, unlabored. Lung sounds are clear   CARDIOVASCULAR: Heart RRR no murmurs, rubs or gallops.  ARTERIAL: radial pulse 2+ VENOUS: No varicosities. No venous stasis skin changes  GASTROINTESTINAL: Abdomen is soft, non-tender, not distended w/ normal bowel sounds. GENITOURINARY: Bladder non tender, not distended  MUSCULOSKELETAL: weak Bilateral LE NEUROLOGIC: alert; oriented to self  Moves all extremities no tremor. PSYCHIATRIC: Mood and affect appropriate to situation, no behavioral issues  Labs reviewed/Significant Diagnostic Results: CBC with Diff  Result: 03/02/2013 2:17 PM ( Status: F ) C  WBC 4.9 4.0-10.5 K/uL SLN  RBC 3.73  L 4.22-5.81 MIL/uL SLN  Hemoglobin 10.4 L 13.0-17.0 g/dL SLN  Hematocrit 96.2 L 39.0-52.0 % SLN  MCV 83.9 78.0-100.0 fL SLN  MCH 27.9 26.0-34.0 pg SLN  MCHC 33.2 30.0-36.0 g/dL SLN  RDW 95.2 84.1-32.4 % SLN  Platelet Count 343 150-400 K/uL SLN  Granulocyte % 48 43-77 % SLN  Absolute Gran 2.4 1.7-7.7 K/uL SLN  Lymph % 32 12-46 % SLN  Absolute Lymph 1.6 0.7-4.0 K/uL SLN  Mono % 14 H 3-12 % SLN  Absolute Mono 0.7 0.1-1.0 K/uL SLN  Eos % 6 H 0-5 % SLN  Absolute Eos 0.3 0.0-0.7 K/uL SLN  Baso % 0 0-1 % SLN  Absolute Baso 0.0 0.0-0.1 K/uL SLN  Smear Review Criteria for review not met SLN  Comprehensive Metabolic Panel  Result: 03/02/2013 3:38 PM ( Status: F )  Sodium 139 135-145 mEq/L SLN  Potassium 4.1 3.5-5.3 mEq/L SLN  Chloride 104 96-112 mEq/L SLN  CO2 28 19-32 mEq/L SLN  Glucose 136 H 70-99 mg/dL SLN  BUN 27 H 4-01 mg/dL SLN  Creatinine 0.27 H 0.50-1.35 mg/dL SLN  Bilirubin, Total 0.4 0.3-1.2 mg/dL SLN  Alkaline Phosphatase 78 39-117 U/L SLN  AST/SGOT 13 0-37 U/L SLN  ALT/SGPT 20 0-53 U/L SLN  Total Protein 6.2 6.0-8.3 g/dL SLN  Albumin 2.8 L 2.5-3.6 g/dL SLN  Calcium 8.6 6.4-40.3 mg/dL SLN     Assessment/Plan 1. OA (osteoarthritis) Improved with scheduled medications  2. Unspecified constipation Stable on current medications  3. Anemia CBC was not followed up; will reorder this at this time  4. GERD (gastroesophageal reflux disease) Will start omeprazole 20 mg daily for acid reflux

## 2013-04-21 ENCOUNTER — Other Ambulatory Visit: Payer: Self-pay | Admitting: *Deleted

## 2013-04-21 MED ORDER — PREGABALIN 25 MG PO CAPS: ORAL_CAPSULE | ORAL | Status: DC

## 2013-04-26 ENCOUNTER — Other Ambulatory Visit: Payer: Self-pay

## 2013-04-26 MED ORDER — TRAMADOL HCL 50 MG PO TABS: ORAL_TABLET | ORAL | Status: DC

## 2013-04-26 NOTE — Telephone Encounter (Signed)
Verified dose and instructions reflect manual request received by nursing home.   

## 2013-05-16 ENCOUNTER — Non-Acute Institutional Stay (SKILLED_NURSING_FACILITY): Payer: PRIVATE HEALTH INSURANCE | Admitting: Nurse Practitioner

## 2013-05-16 ENCOUNTER — Encounter: Payer: Self-pay | Admitting: Nurse Practitioner

## 2013-05-16 DIAGNOSIS — I6350 Cerebral infarction due to unspecified occlusion or stenosis of unspecified cerebral artery: Secondary | ICD-10-CM

## 2013-05-16 DIAGNOSIS — E119 Type 2 diabetes mellitus without complications: Secondary | ICD-10-CM

## 2013-05-16 DIAGNOSIS — F329 Major depressive disorder, single episode, unspecified: Secondary | ICD-10-CM

## 2013-05-16 DIAGNOSIS — I10 Essential (primary) hypertension: Secondary | ICD-10-CM

## 2013-05-16 DIAGNOSIS — I639 Cerebral infarction, unspecified: Secondary | ICD-10-CM

## 2013-05-16 NOTE — Progress Notes (Signed)
Patient ID: [name], female   DOB: Jan 03, 1946, 67 y.o.   MRN: [id number]   PCP: [name], MD   No Known Allergies  Chief Complaint  Patient presents with  . Medical Managment of Chronic Issues    HPI:  67 y.o.female with a PMH of hypertension, diabetes mellitus with peripheral neuropathy with recent large acute left PCA infarct as well as small acute right PCA cortical infarct. Pt has been doing well. Recent increase in celexa to 20 mg with good effects; pt reports she has been in a better mood; no complaints noted during visit; staff without concerns.   Review of Systems:  DATA OBTAINED: from patient, nurse, medical record  GENERAL: Feels well no fevers, fatigue, appetite changes  SKIN: No itching, rash or wounds  MOUTH/THROAT: No mouth or tooth pain, No sore throat, No difficulty chewing or swallowing  RESPIRATORY: No cough, wheezing, SOB  CARDIAC: No chest pain, palpitations, lower extremity edema  GI: No abdominal pain, No N/V/D or constipation, has worsening heartburn  GU: No dysuria, frequency or urgency  MUSCULOSKELETAL: as freq knee pain  NEUROLOGIC: Awake, alert, appropriate to situation, No change in mental status. Moves all four, no focal deficits  PSYCHIATRIC: No overt anxiety or sadness. Sleeps well. No behavior issue.  AMBULATION: WC   Past Medical History  Diagnosis Date  . Hypertension   . Diabetes mellitus   . Glaucoma   . Cataract    No past surgical history on file. Social History:   reports that she has never smoked. She does not have any smokeless tobacco history on file. She reports that she does not drink alcohol or use illicit drugs.  Family History  Problem Relation Age of Onset  . Stroke Mother   . Diabetes type II Mother   . CAD Father     Medications: Patient's Medications  New Prescriptions   No medications on file  Previous Medications   ASPIRIN 81 MG CHEWABLE TABLET    Chew 1 tablet (81 mg total) by mouth daily.    CETIRIZINE (ZYRTEC) 10 MG TABLET    Take 10 mg by mouth daily.   CITALOPRAM (CELEXA) 10 MG TABLET    Take 20 mg by mouth daily.    CLONIDINE (CATAPRES) 0.1 MG TABLET    Take 2 tablets (0.2 mg total) by mouth 2 (two) times daily.   DILTIAZEM (CARDIZEM CD) 360 MG 24 HR CAPSULE    Take 360 mg by mouth daily.   DOCUSATE SODIUM (COLACE) 100 MG CAPSULE    Take 100 mg by mouth 2 (two) times daily.   DOXAZOSIN (CARDURA) 1 MG TABLET    Take 1 mg by mouth at bedtime.   GLIMEPIRIDE (AMARYL) 1 MG TABLET    Take 1 tablet (1 mg total) by mouth daily before breakfast.   MAGNESIUM HYDROXIDE (MILK OF MAGNESIA PO)    Take 30 mLs by mouth daily as needed (for constipation). Give if no BM in 3 days   METOPROLOL SUCCINATE (TOPROL-XL) 100 MG 24 HR TABLET    Take 100 mg by mouth daily. Take with or immediately following a meal.   POLYETHYLENE GLYCOL (MIRALAX / GLYCOLAX) PACKET    Take 17 g by mouth daily.   PREGABALIN (LYRICA) 25 MG CAPSULE    Take one capsule by mouth twice daily   RIVAROXABAN (XARELTO) 15 MG TABS TABLET    Take 1 tablet (15 mg total) by mouth daily with supper.   TRAMADOL (ULTRAM) 50 MG  TABLET    1 by mouth three times daily for pain ** HOLD FOR SEDATION**  Modified Medications   No medications on file  Discontinued Medications   No medications on file     Physical Exam:  Filed Vitals:   05/16/13 1439  BP: 132/63  Pulse: 60  Temp: 97.9 F (36.6 C)  Resp: 18    GENERAL APPEARANCE: Alert, conversant. Appropriately groomed. No acute distress.  SKIN: No diaphoresis rash, or wounds  HEENT: unremarkable RESPIRATORY: Breathing is even, unlabored. Lung sounds are clear  CARDIOVASCULAR: Heart RRR no murmurs, rubs or gallops.  ARTERIAL: radial pulse 2+  GASTROINTESTINAL: Abdomen is soft, non-tender, not distended w/ normal bowel sounds. GENITOURINARY: Bladder non tender, not distended  MUSCULOSKELETAL: weak Bilateral LE  NEUROLOGIC: alert; oriented to self Moves all extremities no tremor.   PSYCHIATRIC: Mood and affect appropriate to situation, no behavioral issues      Labs reviewed: Basic Metabolic Panel:  Recent Labs  16/10/96 0945 01/28/13 1110 02/01/13 1451  NA 135 137 136  K 3.9 3.7 4.2  CL 101 100 101  CO2 25 28 26   GLUCOSE 180* 173* 178*  BUN 28* 18 22  CREATININE 1.62* 1.25* 1.44*  CALCIUM 9.2 9.6 9.0   Liver Function Tests:  Recent Labs  11/25/12 0625 01/28/13 1110 02/01/13 1451  AST 22 12 15   ALT 25 17 19   ALKPHOS 63 69 61  BILITOT 0.4 0.5 0.4  PROT 7.0 7.6 7.1  ALBUMIN 2.6* 3.0* 2.7*   No results found for this basename: LIPASE, AMYLASE,  in the last 8760 hours  Recent Labs  01/29/13 1125  AMMONIA 18   CBC:  Recent Labs  11/25/12 0625 12/07/12 0945 01/27/13 1918 01/28/13 1110  WBC 7.1 4.0 7.0 6.9  NEUTROABS 3.8  --  5.7 4.5  HGB 12.5 13.1 13.8 13.5  HCT 36.9 38.5 41.4 40.2  MCV 84.8 86.7 85.9 85.4  PLT 371 355 353 349   Cardiac Enzymes:  Recent Labs  11/18/12 0330  TROPONINI <0.30   BNP: No components found with this basename: POCBNP,  CBG:  Recent Labs  02/02/13 0647 02/02/13 1118 02/02/13 1625  GLUCAP 152* 195* 220*    CBC with Diff  Result: 03/02/2013 2:17 PM ( Status: F ) C  WBC 4.9 4.0-10.5 K/uL SLN  RBC 3.73 L 4.22-5.81 MIL/uL SLN  Hemoglobin 10.4 L 13.0-17.0 g/dL SLN  Hematocrit 04.5 L 39.0-52.0 % SLN  MCV 83.9 78.0-100.0 fL SLN  MCH 27.9 26.0-34.0 pg SLN  MCHC 33.2 30.0-36.0 g/dL SLN  RDW 40.9 81.1-91.4 % SLN  Platelet Count 343 150-400 K/uL SLN  Granulocyte % 48 43-77 % SLN  Absolute Gran 2.4 1.7-7.7 K/uL SLN  Lymph % 32 12-46 % SLN  Absolute Lymph 1.6 0.7-4.0 K/uL SLN  Mono % 14 H 3-12 % SLN  Absolute Mono 0.7 0.1-1.0 K/uL SLN  Eos % 6 H 0-5 % SLN  Absolute Eos 0.3 0.0-0.7 K/uL SLN  Baso % 0 0-1 % SLN  Absolute Baso 0.0 0.0-0.1 K/uL SLN  Smear Review Criteria for review not met SLN  Comprehensive Metabolic Panel  Result: 03/02/2013 3:38 PM ( Status: F )  Sodium 139 135-145 mEq/L  SLN  Potassium 4.1 3.5-5.3 mEq/L SLN  Chloride 104 96-112 mEq/L SLN  CO2 28 19-32 mEq/L SLN  Glucose 136 H 70-99 mg/dL SLN  BUN 27 H 7-82 mg/dL SLN  Creatinine 9.56 H 0.50-1.35 mg/dL SLN  Bilirubin, Total 0.4 0.3-1.2 mg/dL SLN  Alkaline Phosphatase  78 39-117 U/L SLN  AST/SGOT 13 0-37 U/L SLN  ALT/SGPT 20 0-53 U/L SLN  Total Protein 6.2 6.0-8.3 g/dL SLN  Albumin 2.8 L 4.0-9.8 g/dL SLN  Calcium 8.6 1.1-91.4 mg/dL SLN  BC with Diff    Result: 04/20/2013 2:36 PM   ( Status: F )     C WBC 4.6     4.0-10.5 K/uL SLN   RBC 3.89     3.87-5.11 MIL/uL SLN   Hemoglobin 10.9   L 12.0-15.0 g/dL SLN   Hematocrit 78.2   L 36.0-46.0 % SLN   MCV 84.6     78.0-100.0 fL SLN   MCH 28.0     26.0-34.0 pg SLN   MCHC 33.1     30.0-36.0 g/dL SLN   RDW 95.6     21.3-08.6 % SLN   Platelet Count 321     150-400 K/uL SLN   Granulocyte % 33   L 43-77 % SLN   Absolute Gran 1.5   L 1.7-7.7 K/uL SLN   Lymph % 40     12-46 % SLN   Absolute Lymph 1.9     0.7-4.0 K/uL SLN   Mono % 20   H 3-12 % SLN   Absolute Mono 0.9     0.1-1.0 K/uL SLN   Eos % 7   H 0-5 % SLN   Absolute Eos 0.3     0.0-0.7 K/uL SLN   Baso % 0     0-1 % SLN   Absolute Baso 0.0     0.0-0.1 K/uL SLN   Smear Review    Assessment/Plan 1. Hypertension Patients bloodpressure is stable; continue current regimen. Will monitor and make changes as necessary.  2. Diabetes mellitus, controlled Fasting cbgs are WNL; will follow up A1c  3. Depression Improved on increase in celexa; will cont celexa 20 mg daily  4. CKD (chronic kidney disease) stage 3, GFR 30-59 ml/min Follow up labs  5. CVA (cerebral infarction) Patient is stable; continue current regimen. Will monitor and make changes as necessary.

## 2013-06-14 ENCOUNTER — Non-Acute Institutional Stay (SKILLED_NURSING_FACILITY): Payer: PRIVATE HEALTH INSURANCE | Admitting: Nurse Practitioner

## 2013-06-14 ENCOUNTER — Encounter: Payer: Self-pay | Admitting: Nurse Practitioner

## 2013-06-14 DIAGNOSIS — E1129 Type 2 diabetes mellitus with other diabetic kidney complication: Secondary | ICD-10-CM

## 2013-06-14 DIAGNOSIS — I6350 Cerebral infarction due to unspecified occlusion or stenosis of unspecified cerebral artery: Secondary | ICD-10-CM

## 2013-06-14 DIAGNOSIS — I10 Essential (primary) hypertension: Secondary | ICD-10-CM

## 2013-06-14 DIAGNOSIS — I639 Cerebral infarction, unspecified: Secondary | ICD-10-CM

## 2013-06-14 DIAGNOSIS — F329 Major depressive disorder, single episode, unspecified: Secondary | ICD-10-CM

## 2013-06-14 DIAGNOSIS — G609 Hereditary and idiopathic neuropathy, unspecified: Secondary | ICD-10-CM

## 2013-06-14 DIAGNOSIS — N058 Unspecified nephritic syndrome with other morphologic changes: Secondary | ICD-10-CM

## 2013-06-14 NOTE — Progress Notes (Signed)
Patient ID: [name], female   DOB: March 29, 1946, 67 y.o.   MRN: [id number] Nursing Home Location:  [facility]   Place of Service: SNF (31)  PCP: [name], MD  Code Status: FULL  No Known Allergies  Chief Complaint  Patient presents with  . Medical Managment of Chronic Issues    HPI:  67 y.o.female with a PMH of hypertension, diabetes mellitus with peripheral neuropathy with recent large acute left PCA infarct as well as small acute right PCA cortical infarct which is why she is at [employer]. Pt has been doing well. Pt reports increase in leg pain; tramadol not helping  Staff without concerns    Review of Systems:  Review of Systems  Constitutional: Negative for malaise/fatigue.  Respiratory: Negative for cough and shortness of breath.   Cardiovascular: Negative for chest pain.  Gastrointestinal: Negative for heartburn, abdominal pain, diarrhea and constipation.  Genitourinary: Negative for dysuria, urgency and frequency.  Musculoskeletal: Positive for joint pain and myalgias.  Skin: Negative.   Neurological: Positive for tingling. Negative for dizziness, weakness and headaches.  Psychiatric/Behavioral: Positive for memory loss. Negative for depression. The patient is not nervous/anxious and does not have insomnia.      Past Medical History  Diagnosis Date  . Hypertension   . Diabetes mellitus   . Glaucoma   . Cataract    No past surgical history on file. Social History:   reports that she has never smoked. She does not have any smokeless tobacco history on file. She reports that she does not drink alcohol or use illicit drugs.  Family History  Problem Relation Age of Onset  . Stroke Mother   . Diabetes type II Mother   . CAD Father     Medications: Patient's Medications  New Prescriptions   No medications on file  Previous Medications   ASPIRIN 81 MG CHEWABLE TABLET    Chew 1 tablet (81 mg total) by mouth daily.   CETIRIZINE (ZYRTEC)  10 MG TABLET    Take 10 mg by mouth daily.   CITALOPRAM (CELEXA) 10 MG TABLET    Take 20 mg by mouth daily.    CLONIDINE (CATAPRES) 0.1 MG TABLET    Take 2 tablets (0.2 mg total) by mouth 2 (two) times daily.   DILTIAZEM (CARDIZEM CD) 360 MG 24 HR CAPSULE    Take 360 mg by mouth daily.   DOCUSATE SODIUM (COLACE) 100 MG CAPSULE    Take 100 mg by mouth 2 (two) times daily.   DOXAZOSIN (CARDURA) 1 MG TABLET    Take 1 mg by mouth at bedtime.   GLIMEPIRIDE (AMARYL) 1 MG TABLET    Take 1 tablet (1 mg total) by mouth daily before breakfast.   MAGNESIUM HYDROXIDE (MILK OF MAGNESIA PO)    Take 30 mLs by mouth daily as needed (for constipation). Give if no BM in 3 days   METOPROLOL SUCCINATE (TOPROL-XL) 100 MG 24 HR TABLET    Take 100 mg by mouth daily. Take with or immediately following a meal.   POLYETHYLENE GLYCOL (MIRALAX / GLYCOLAX) PACKET    Take 17 g by mouth daily.   PREGABALIN (LYRICA) 25 MG CAPSULE    Take one capsule by mouth twice daily   RIVAROXABAN (XARELTO) 15 MG TABS TABLET    Take 1 tablet (15 mg total) by mouth daily with supper.   TRAMADOL (ULTRAM) 50 MG TABLET    1 by mouth three times daily for pain **  HOLD FOR SEDATION**  Modified Medications   No medications on file  Discontinued Medications   No medications on file     Physical Exam:  Filed Vitals:   06/14/13 1156  BP: 145/68  Pulse: 85  Temp: 97.9 F (36.6 C)  Resp: 20    Physical Exam  Constitutional: She is well-developed, well-nourished, and in no distress. No distress.  Cardiovascular: Normal rate, regular rhythm and normal heart sounds.   Pulmonary/Chest: Effort normal and breath sounds normal. No respiratory distress.  Abdominal: Soft. Bowel sounds are normal. She exhibits no distension. There is no tenderness.  Musculoskeletal: She exhibits no edema.  Neurological: She is alert.  Skin: Skin is warm and dry. She is not diaphoretic.  Psychiatric: Affect normal.     Labs reviewed: Basic Metabolic  Panel:  Recent Labs  12/07/12 0945 01/28/13 1110 02/01/13 1451  NA 135 137 136  K 3.9 3.7 4.2  CL 101 100 101  CO2 25 28 26   GLUCOSE 180* 173* 178*  BUN 28* 18 22  CREATININE 1.62* 1.25* 1.44*  CALCIUM 9.2 9.6 9.0   Liver Function Tests:  Recent Labs  11/25/12 0625 01/28/13 1110 02/01/13 1451  AST 22 12 15   ALT 25 17 19   ALKPHOS 63 69 61  BILITOT 0.4 0.5 0.4  PROT 7.0 7.6 7.1  ALBUMIN 2.6* 3.0* 2.7*   No results found for this basename: LIPASE, AMYLASE,  in the last 8760 hours  Recent Labs  01/29/13 1125  AMMONIA 18   CBC:  Recent Labs  11/25/12 0625 12/07/12 0945 01/27/13 1918 01/28/13 1110  WBC 7.1 4.0 7.0 6.9  NEUTROABS 3.8  --  5.7 4.5  HGB 12.5 13.1 13.8 13.5  HCT 36.9 38.5 41.4 40.2  MCV 84.8 86.7 85.9 85.4  PLT 371 355 353 349   Cardiac Enzymes:  Recent Labs  11/18/12 0330  TROPONINI <0.30   BNP: No components found with this basename: POCBNP,  CBG:  Recent Labs  02/02/13 0647 02/02/13 1118 02/02/13 1625  GLUCAP 152* 195* 220*   TSH:  Recent Labs  12/01/12 0725 01/28/13 1713  TSH 1.649 1.536   A1C: Lab Results  Component Value Date   HGBA1C 7.4* 01/28/2013  CBC with Diff  Result: 03/02/2013 2:17 PM ( Status: F ) C  WBC 4.9 4.0-10.5 K/uL SLN  RBC 3.73 L 4.22-5.81 MIL/uL SLN  Hemoglobin 10.4 L 13.0-17.0 g/dL SLN  Hematocrit 40.9 L 39.0-52.0 % SLN  MCV 83.9 78.0-100.0 fL SLN  MCH 27.9 26.0-34.0 pg SLN  MCHC 33.2 30.0-36.0 g/dL SLN  RDW 81.1 91.4-78.2 % SLN  Platelet Count 343 150-400 K/uL SLN  Granulocyte % 48 43-77 % SLN  Absolute Gran 2.4 1.7-7.7 K/uL SLN  Lymph % 32 12-46 % SLN  Absolute Lymph 1.6 0.7-4.0 K/uL SLN  Mono % 14 H 3-12 % SLN  Absolute Mono 0.7 0.1-1.0 K/uL SLN  Eos % 6 H 0-5 % SLN  Absolute Eos 0.3 0.0-0.7 K/uL SLN  Baso % 0 0-1 % SLN  Absolute Baso 0.0 0.0-0.1 K/uL SLN  Smear Review Criteria for review not met SLN  Comprehensive Metabolic Panel  Result: 03/02/2013 3:38 PM ( Status: F )  Sodium  139 135-145 mEq/L SLN  Potassium 4.1 3.5-5.3 mEq/L SLN  Chloride 104 96-112 mEq/L SLN  CO2 28 19-32 mEq/L SLN  Glucose 136 H 70-99 mg/dL SLN  BUN 27 H 9-56 mg/dL SLN  Creatinine 2.13 H 0.50-1.35 mg/dL SLN  Bilirubin, Total 0.4 0.3-1.2 mg/dL SLN  Alkaline Phosphatase 78 39-117 U/L SLN  AST/SGOT 13 0-37 U/L SLN  ALT/SGPT 20 0-53 U/L SLN  Total Protein 6.2 6.0-8.3 g/dL SLN  Albumin 2.8 L 8.2-9.5 g/dL SLN  Calcium 8.6 6.2-13.0 mg/dL SLN  BC with Diff  Result: 04/20/2013 2:36 PM ( Status: F ) C  WBC 4.6 4.0-10.5 K/uL SLN  RBC 3.89 3.87-5.11 MIL/uL SLN  Hemoglobin 10.9 L 12.0-15.0 g/dL SLN  Hematocrit 86.5 L 36.0-46.0 % SLN  MCV 84.6 78.0-100.0 fL SLN  MCH 28.0 26.0-34.0 pg SLN  MCHC 33.1 30.0-36.0 g/dL SLN  RDW 78.4 69.6-29.5 % SLN  Platelet Count 321 150-400 K/uL SLN  Granulocyte % 33 L 43-77 % SLN  Absolute Gran 1.5 L 1.7-7.7 K/uL SLN  Lymph % 40 12-46 % SLN  Absolute Lymph 1.9 0.7-4.0 K/uL SLN  Mono % 20 H 3-12 % SLN  Absolute Mono 0.9 0.1-1.0 K/uL SLN  Eos % 7 H 0-5 % SLN  Absolute Eos 0.3 0.0-0.7 K/uL SLN  Baso % 0 0-1 % SLN  Absolute Baso 0.0 0.0-0.1 K/uL SLN  Smear Review cBC NO Diff (Complete Blood Count)    Result: 05/17/2013 3:33 PM   ( Status: F )     C WBC 5.1     4.0-10.5 K/uL SLN   RBC 4.01     3.87-5.11 MIL/uL SLN   Hemoglobin 11.3   L 12.0-15.0 g/dL SLN   Hematocrit 28.4   L 36.0-46.0 % SLN   MCV 83.3     78.0-100.0 fL SLN   MCH 28.2     26.0-34.0 pg SLN   MCHC 33.8     30.0-36.0 g/dL SLN   RDW 13.2     44.0-10.2 % SLN   Platelet Count 374     150-400 K/uL SLN   Comprehensive Metabolic Panel    Result: 05/17/2013 2:53 PM   ( Status: F )       Sodium 135     135-145 mEq/L SLN   Potassium 4.1     3.5-5.3 mEq/L SLN   Chloride 100     96-112 mEq/L SLN   CO2 29     19-32 mEq/L SLN   Glucose 137   H 70-99 mg/dL SLN   BUN 28   H 7-25 mg/dL SLN   Creatinine 3.66   H 0.50-1.10 mg/dL SLN   Bilirubin, Total 0.3     0.3-1.2 mg/dL SLN   Alkaline Phosphatase 105      39-117 U/L SLN   AST/SGOT 18     0-37 U/L SLN   ALT/SGPT 25     0-35 U/L SLN   Total Protein 6.9     6.0-8.3 g/dL SLN   Albumin 3.0   L 4.4-0.3 g/dL SLN   Calcium 9.0     4.7-42.5 mg/dL SLN   Lipid Profile    Result: 05/17/2013 2:53 PM   ( Status: F )       Cholesterol 91     0-200 mg/dL SLN C Triglyceride 66     <150 mg/dL SLN   HDL Cholesterol 47     >39 mg/dL SLN   Total Chol/HDL Ratio 1.9      Ratio SLN   VLDL Cholesterol (Calc) 13     0-40 mg/dL SLN   LDL Cholesterol (Calc) 31     0-99 mg/dL SLN C Hemoglobin Z5G    Result: 05/17/2013 7:12 PM   ( Status: F )       Hemoglobin A1C 7.7  H <5.7 % SLN C Estimated Average Glucose 174   H <117 mg/dL SLN  Assessment/Plan 1. Hypertension Patient is stable; continue current regimen. Will monitor and make changes as necessary.  2. Diabetes mellitus with renal manifestations, controlled Stable; will cont current medications  3. CKD (chronic kidney disease) stage 3, GFR 30-59 ml/min BUN and Cr have remained stable  4. Depression Patient is stable; continue current medication. Will monitor and make changes as necessary.  5. CVA (cerebral infarction) Stable; remains on xarelto  6. Unspecified hereditary and idiopathic peripheral neuropathy Worsening leg pain; tramadol not helping; on lyrica 25 mg BID will increase this to TID to help with pain managment  7. Anemia recent H/H has improved; no signs of bleeding or blood loss

## 2013-06-15 ENCOUNTER — Other Ambulatory Visit: Payer: Self-pay | Admitting: *Deleted

## 2013-06-15 MED ORDER — TRAMADOL HCL 50 MG PO TABS: ORAL_TABLET | ORAL | Status: DC

## 2013-06-15 MED ORDER — PREGABALIN 25 MG PO CAPS: ORAL_CAPSULE | ORAL | Status: DC

## 2013-06-16 ENCOUNTER — Other Ambulatory Visit: Payer: Self-pay

## 2013-06-24 ENCOUNTER — Emergency Department (HOSPITAL_COMMUNITY)
Admission: EM | Admit: 2013-06-24 | Discharge: 2013-06-24 | Disposition: A | Discharge status: Skilled Nursing Facility | Payer: PRIVATE HEALTH INSURANCE | Attending: Emergency Medicine | Admitting: Emergency Medicine

## 2013-06-24 ENCOUNTER — Emergency Department (HOSPITAL_COMMUNITY): Payer: PRIVATE HEALTH INSURANCE

## 2013-06-24 ENCOUNTER — Encounter (HOSPITAL_COMMUNITY): Payer: Self-pay | Admitting: Emergency Medicine

## 2013-06-24 DIAGNOSIS — Z87448 Personal history of other diseases of urinary system: Secondary | ICD-10-CM | POA: Insufficient documentation

## 2013-06-24 DIAGNOSIS — I4891 Unspecified atrial fibrillation: Secondary | ICD-10-CM | POA: Diagnosis not present

## 2013-06-24 DIAGNOSIS — I10 Essential (primary) hypertension: Secondary | ICD-10-CM | POA: Diagnosis not present

## 2013-06-24 DIAGNOSIS — R1110 Vomiting, unspecified: Secondary | ICD-10-CM | POA: Insufficient documentation

## 2013-06-24 DIAGNOSIS — Z8669 Personal history of other diseases of the nervous system and sense organs: Secondary | ICD-10-CM | POA: Diagnosis not present

## 2013-06-24 DIAGNOSIS — E119 Type 2 diabetes mellitus without complications: Secondary | ICD-10-CM | POA: Insufficient documentation

## 2013-06-24 DIAGNOSIS — Z79899 Other long term (current) drug therapy: Secondary | ICD-10-CM | POA: Diagnosis not present

## 2013-06-24 DIAGNOSIS — Z8673 Personal history of transient ischemic attack (TIA), and cerebral infarction without residual deficits: Secondary | ICD-10-CM | POA: Insufficient documentation

## 2013-06-24 DIAGNOSIS — R4182 Altered mental status, unspecified: Secondary | ICD-10-CM | POA: Diagnosis present

## 2013-06-24 DIAGNOSIS — M25579 Pain in unspecified ankle and joints of unspecified foot: Secondary | ICD-10-CM | POA: Insufficient documentation

## 2013-06-24 DIAGNOSIS — Z7901 Long term (current) use of anticoagulants: Secondary | ICD-10-CM | POA: Insufficient documentation

## 2013-06-24 HISTORY — DX: Unspecified atrial fibrillation: I48.91

## 2013-06-24 HISTORY — DX: Cerebral infarction, unspecified: I63.9

## 2013-06-24 HISTORY — DX: Disorder of kidney and ureter, unspecified: N28.9

## 2013-06-24 LAB — URINALYSIS, ROUTINE W REFLEX MICROSCOPIC
Glucose, UA: 100 mg/dL — AB
Leukocytes, UA: NEGATIVE
Specific Gravity, Urine: 1.021 (ref 1.005–1.030)
pH: 7 (ref 5.0–8.0)

## 2013-06-24 LAB — COMPREHENSIVE METABOLIC PANEL
ALT: 28 U/L (ref 0–35)
AST: 22 U/L (ref 0–37)
CO2: 30 mEq/L (ref 19–32)
Calcium: 9.7 mg/dL (ref 8.4–10.5)
GFR calc non Af Amer: 51 mL/min — ABNORMAL LOW (ref >90)
Sodium: 137 mEq/L (ref 135–145)
Total Protein: 9.2 g/dL — ABNORMAL HIGH (ref 6.0–8.3)

## 2013-06-24 LAB — URINE MICROSCOPIC-ADD ON

## 2013-06-24 LAB — TROPONIN I: Troponin I: <0.3 ng/mL (ref <0.30)

## 2013-06-24 LAB — CBC
MCH: 29.4 pg (ref 26.0–34.0)
Platelets: 339 10*3/uL (ref 150–400)
RBC: 5.03 MIL/uL (ref 3.87–5.11)

## 2013-06-24 MED ORDER — METOPROLOL TARTRATE 1 MG/ML IV SOLN: 5.0000 mg | Freq: Once | INTRAVENOUS | Status: DC

## 2013-06-24 MED ORDER — METOPROLOL TARTRATE 1 MG/ML IV SOLN
5.0000 mg | Freq: Once | INTRAVENOUS | Status: AC
  Administered 2013-06-24: 5 mg via INTRAVENOUS
  Filled 2013-06-24: qty 5

## 2013-06-24 MED ORDER — CLONIDINE HCL 0.2 MG PO TABS
0.2000 mg | ORAL_TABLET | Freq: Once | ORAL | Status: DC
  Filled 2013-06-24: qty 1

## 2013-06-24 MED ORDER — LORAZEPAM 2 MG/ML IJ SOLN
1.0000 mg | Freq: Once | INTRAMUSCULAR | Status: AC
  Administered 2013-06-24: 1 mg via INTRAVENOUS
  Filled 2013-06-24: qty 1

## 2013-06-24 MED ORDER — HYDRALAZINE HCL 20 MG/ML IJ SOLN
20.0000 mg | Freq: Once | INTRAMUSCULAR | Status: AC
  Administered 2013-06-24: 20 mg via INTRAVENOUS
  Filled 2013-06-24: qty 1

## 2013-06-24 NOTE — ED Notes (Addendum)
[facility] at bedside, pt became combative, unable to take BP, HR between 98-158 Afib. [facility] declining to transport pt at this time. Dr. Anitra Lauth made aware

## 2013-06-24 NOTE — ED Notes (Signed)
[name] and [facility] given report/discharge instructions. Paperwork given to [name] to give to [facility]. Pt calm and cooperative at this time, in NAD

## 2013-06-24 NOTE — ED Notes (Addendum)
Pt alert, eyes open, and moving BUE upon entering room. When this RN spoke to pt, pt stopped moving and said "Oh Lord", and closed her eyes.  Pt states "I'm freezing, I'm freezing". Two warm blankets given to pt.

## 2013-06-24 NOTE — ED Provider Notes (Signed)
8:22 PM Sign out received from Dr Anitra Lauth at change of shift.  Pt with hx stoke (PCA infarct) on aspirin and xarelto brought from nursing home [facility]) with AMS/?unresponsive.  CT is unchanged, labs are normal, UA is pending.  Pt has also been tachycardic and hypertensive but has hx Afib and HTN, has been medicated for this.  Plan is for d/c home after UA results +/- antibiotics.  Discharge paperwork already completed by Dr Anitra Lauth.   UA is negative.  Culture pending.  Pt to be discharged.   Trixie Dredge, PA-C 06/25/13 [phone]

## 2013-06-24 NOTE — ED Provider Notes (Addendum)
CSN: [id number]     Arrival date & time 06/24/13  1404 History   First MD Initiated Contact with Patient 06/24/13 1409     Chief Complaint  Patient presents with  . Altered Mental Status   (Consider location/radiation/quality/duration/timing/severity/associated sxs/prior Treatment) HPI Comments: Pt lives in [facility] nursing facility and was normal today and at breakfast but did have 1 episode of vomiting.  However when family came today she was unresponsive.  Pt will not given any hx on exam but when you walk into the room she sits up and looks at the person coming in the door.  Patient is a 67 y.o. female presenting with altered mental status. The history is provided by the EMS personnel. The history is limited by the absence of a caregiver and the condition of the patient.  Altered Mental Status Presenting symptoms: unresponsiveness   Most recent episode:  Today Episode history:  Continuous Duration: unknown. Timing:  Constant Chronicity:  New   Past Medical History  Diagnosis Date  . Hypertension   . Diabetes mellitus   . Glaucoma   . Cataract   . Stroke   . Renal disorder   . Atrial fibrillation    History reviewed. No pertinent past surgical history. Family History  Problem Relation Age of Onset  . Stroke Mother   . Diabetes type II Mother   . CAD Father    History  Substance Use Topics  . Smoking status: Never Smoker   . Smokeless tobacco: Not on file  . Alcohol Use: No   OB History   Grav Para Term Preterm Abortions TAB SAB Ect Mult Living                 Review of Systems  Unable to perform ROS   Allergies  Review of patient's allergies indicates no known allergies.  Home Medications   Current Outpatient Rx  Name  Route  Sig  Dispense  Refill  . acetaminophen (TYLENOL) 325 MG tablet   Oral   Take 650 mg by mouth every 6 (six) hours as needed for mild pain.         . citalopram (CELEXA) 10 MG tablet   Oral   Take 20 mg by mouth daily.           . cloNIDine (CATAPRES) 0.1 MG tablet   Oral   Take 2 tablets (0.2 mg total) by mouth 2 (two) times daily.   60 tablet   11   . docusate sodium (COLACE) 100 MG capsule   Oral   Take 100 mg by mouth 2 (two) times daily.         Marland Kitchen glimepiride (AMARYL) 1 MG tablet   Oral   Take 1 tablet (1 mg total) by mouth daily before breakfast.         . pregabalin (LYRICA) 25 MG capsule   Oral   Take 25 mg by mouth 3 (three) times daily.         . Rivaroxaban (XARELTO) 15 MG TABS tablet   Oral   Take 1 tablet (15 mg total) by mouth daily with supper.   30 tablet      . traMADol (ULTRAM) 50 MG tablet   Oral   Take 50 mg by mouth every 6 (six) hours as needed for moderate pain.          BP 151/100  Pulse 97  Temp(Src) 98.2 F (36.8 C) (Oral)  Resp 17  SpO2 100% Physical  Exam  Nursing note and vitals reviewed. Constitutional: She is oriented to person, place, and time. She appears well-developed and well-nourished. No distress.  HENT:  Head: Normocephalic and atraumatic.  Mouth/Throat: Oropharynx is clear and moist.  Eyes: Conjunctivae and EOM are normal. Pupils are equal, round, and reactive to light.  Neck: Normal range of motion. Neck supple.  Cardiovascular: Normal rate and intact distal pulses.  An irregularly irregular rhythm present.  No murmur heard. Pulmonary/Chest: Effort normal and breath sounds normal. No respiratory distress. She has no wheezes. She has no rales. She exhibits no tenderness.  Abdominal: Soft. She exhibits no distension. There is no tenderness. There is no rebound and no guarding.  Musculoskeletal: Normal range of motion. She exhibits tenderness. She exhibits no edema.  C/o of right foot tenderness however no edema, swelling, erythema or wounds  Neurological: She is alert and oriented to person, place, and time.  Will not follow commands but will move bilateral upper and lower ext  Skin: Skin is warm and dry. No rash noted. No erythema.   Psychiatric: She has a normal mood and affect. Her behavior is normal.    ED Course  Procedures (including critical care time) Labs Review Labs Reviewed  GLUCOSE, CAPILLARY - Abnormal; Notable for the following:    Glucose-Capillary 159 (*)    All other components within normal limits  COMPREHENSIVE METABOLIC PANEL - Abnormal; Notable for the following:    Glucose, Bld 175 (*)    Creatinine, Ser 1.11 (*)    Total Protein 9.2 (*)    Albumin 3.1 (*)    GFR calc non Af Amer 51 (*)    GFR calc Af Amer 59 (*)    All other components within normal limits  CBC  PROTIME-INR  TROPONIN I  URINALYSIS, ROUTINE W REFLEX MICROSCOPIC   Imaging Review Dg Chest 1 View  06/24/2013   CLINICAL DATA:  Altered mental status.  EXAM: CHEST - 1 VIEW  COMPARISON:  Single view of the chest 01/28/2013.  FINDINGS: There is cardiomegaly without edema. Lungs are clear. No pneumothorax or pleural fluid.  IMPRESSION: Cardiomegaly without acute disease.   Electronically Signed   By: Drusilla Kanner M.D.   On: 06/24/2013 15:40   Ct Head Wo Contrast  06/24/2013   CLINICAL DATA:  Altered mental status  EXAM: CT HEAD WITHOUT CONTRAST  TECHNIQUE: Contiguous axial images were obtained from the base of the skull through the vertex without intravenous contrast.  COMPARISON:  01/28/2013  FINDINGS: Study is limited by motion artifacts. The visualized paranasal sinuses and mastoid air cells are unremarkable.  No intracranial hemorrhage, mass effect or midline shift. Stable old infarct in left PCA territory. No definite acute cortical infarction. No mass lesion is noted on this unenhanced scan. Stable cerebral atrophy. Stable periventricular and patchy subcortical chronic white matter disease.  IMPRESSION: No acute intracranial abnormality. Stable atrophy and chronic white matter disease. Again noted old infarct in left PCA territory. No definite acute cortical infarction.   Electronically Signed   By: Natasha Mead M.D.   On:  06/24/2013 15:24    EKG Interpretation   None       Date: 06/24/2013  Rate: 94  Rhythm: atrial fibrillation  QRS Axis: normal  Intervals: QT prolonged  ST/T Wave abnormalities: nonspecific ST changes  Conduction Disutrbances:none  Narrative Interpretation:   Old EKG Reviewed: unchanged   MDM   1. Change in mental status   2. Hypertension   3. Atrial fibrillation  Presents from her nursing home with apparent altered mental status. Family called EMS from the nursing facility because she was not responding when they arrive. Upon arrival here patient has been responsive. She does not answer questions but other than complaining of foot pain he has not indicated any other problems. Initially she was significantly hypertensive when she arrived but with rest in the stimulation her blood pressure is improved to 158/112. Patient does have a history of atrial fibrillation and has been between her rate of 80 and 110 here. Patient has no focal neurologic deficits. She is able to both arms and legs but again is not cooperative with exam. Head CT to ensure no acute intercranial bleed because patient is on xarelto was negative. Also no acute signs of stroke. Hemoglobin was normal, no signs of hypoglycemia and chest x-ray is clear. No report of syncope or seizure. Given all blood testing and imaging is negative discharge back to the nursing facility.  5:02 PM Spoke with nursing home facility and pt does not appear to be different from baseline here.  Will d/c back to facility.  HTN here but pt is fighting the BP cuff and do not feel that it is accurate.  7:44 PM HR controlled with lopressor <100 and due to persistent elevated BP and pt refusing to take her clonidine will give hydralazine. Gwyneth Sprout, MD 06/24/13 1606  Gwyneth Sprout, MD 06/24/13 1610  Gwyneth Sprout, MD 06/24/13 1946

## 2013-06-24 NOTE — ED Notes (Addendum)
Pt's sister called in to check on pt. Sister states patient is typically A&O but has "some days" when she is not oriented.

## 2013-06-24 NOTE — ED Notes (Signed)
[facility] contacted for transport 

## 2013-06-24 NOTE — ED Notes (Signed)
Pt back from radiology 

## 2013-06-24 NOTE — ED Notes (Addendum)
Contacted [facility] to obtain more information. Per caregiver, pt is typically alert & oriented but has periods/days of confusion and uses a wheelchair most of the time.

## 2013-06-24 NOTE — ED Notes (Signed)
Per EMS, pt from [place]; staff last reported pt at normal at 0800- ate breakfast and vomitted X 1; family arrived about 1.5 hours ago and noted that pt was unresponsive and called ems; pt responded to painful stimuli; previous stroke R side affected; 22 g L hand

## 2013-06-25 NOTE — ED Provider Notes (Signed)
Medical screening examination/treatment/procedure(s) were conducted as a shared visit with non-physician practitioner(s) and myself.  I personally evaluated the patient during the encounter.  EKG Interpretation   None         Gwyneth Sprout, MD 06/25/13 1253

## 2013-06-26 LAB — URINE CULTURE

## 2013-07-12 ENCOUNTER — Encounter: Payer: Self-pay | Admitting: Nurse Practitioner

## 2013-07-12 ENCOUNTER — Non-Acute Institutional Stay (SKILLED_NURSING_FACILITY): Payer: PRIVATE HEALTH INSURANCE | Admitting: Nurse Practitioner

## 2013-07-12 DIAGNOSIS — I6350 Cerebral infarction due to unspecified occlusion or stenosis of unspecified cerebral artery: Secondary | ICD-10-CM

## 2013-07-12 DIAGNOSIS — N058 Unspecified nephritic syndrome with other morphologic changes: Secondary | ICD-10-CM

## 2013-07-12 DIAGNOSIS — I639 Cerebral infarction, unspecified: Secondary | ICD-10-CM

## 2013-07-12 DIAGNOSIS — G609 Hereditary and idiopathic neuropathy, unspecified: Secondary | ICD-10-CM

## 2013-07-12 DIAGNOSIS — I10 Essential (primary) hypertension: Secondary | ICD-10-CM

## 2013-07-12 DIAGNOSIS — E1129 Type 2 diabetes mellitus with other diabetic kidney complication: Secondary | ICD-10-CM

## 2013-07-12 DIAGNOSIS — M1710 Unilateral primary osteoarthritis, unspecified knee: Secondary | ICD-10-CM

## 2013-07-12 DIAGNOSIS — N183 Chronic kidney disease, stage 3 unspecified: Secondary | ICD-10-CM

## 2013-07-12 DIAGNOSIS — IMO0002 Reserved for concepts with insufficient information to code with codable children: Secondary | ICD-10-CM

## 2013-07-12 NOTE — Progress Notes (Signed)
Patient ID: Analyce Blevins, female   DOB: 01/23/1946, 67 y.o.   MRN: [id number]    Nursing Home Location:  [facility]   Place of Service: SNF (31)  PCP: [name], MD  No Known Allergies  Chief Complaint  Patient presents with  . Medical Managment of Chronic Issues    HPI:  67 y.o.female with a PMH of hypertension, diabetes mellitus with peripheral neuropathy with recent large acute left PCA infarct as well as small acute right PCA cortical infarct; over the past month pt went to ED due to unresponsive episode; work up was neg and she returned to baseline in ED and was transferred back to [place]; since episode pt has been doing well without complaints staff without concerns at this time.   Review of Systems:  Review of Systems  Constitutional: Negative for malaise/fatigue.  Respiratory: Negative for cough and shortness of breath.   Cardiovascular: Negative for chest pain.  Gastrointestinal: Negative for heartburn, abdominal pain, diarrhea and constipation.  Genitourinary: Negative for dysuria, urgency and frequency.  Musculoskeletal: Positive for joint pain.  Skin: Negative.   Neurological: Negative for dizziness, weakness and headaches.  Psychiatric/Behavioral: Positive for memory loss. Negative for depression. The patient is not nervous/anxious and does not have insomnia.      Past Medical History  Diagnosis Date  . Hypertension   . Diabetes mellitus   . Glaucoma   . Cataract   . Stroke   . Renal disorder   . Atrial fibrillation    No past surgical history on file. Social History:   reports that she has never smoked. She does not have any smokeless tobacco history on file. She reports that she does not drink alcohol or use illicit drugs.  Family History  Problem Relation Age of Onset  . Stroke Mother   . Diabetes type II Mother   . CAD Father     Medications: Patient's Medications  New Prescriptions   No medications on file  Previous  Medications   ACETAMINOPHEN (TYLENOL) 325 MG TABLET    Take 650 mg by mouth every 6 (six) hours as needed for mild pain.   CITALOPRAM (CELEXA) 10 MG TABLET    Take 20 mg by mouth daily.    CLONIDINE (CATAPRES) 0.1 MG TABLET    Take 2 tablets (0.2 mg total) by mouth 2 (two) times daily.   DOCUSATE SODIUM (COLACE) 100 MG CAPSULE    Take 100 mg by mouth 2 (two) times daily.   GLIMEPIRIDE (AMARYL) 1 MG TABLET    Take 1 tablet (1 mg total) by mouth daily before breakfast.   PREGABALIN (LYRICA) 25 MG CAPSULE    Take 25 mg by mouth 3 (three) times daily.   RIVAROXABAN (XARELTO) 15 MG TABS TABLET    Take 1 tablet (15 mg total) by mouth daily with supper.   TRAMADOL (ULTRAM) 50 MG TABLET    Take 50 mg by mouth every 6 (six) hours as needed for moderate pain.  Modified Medications   No medications on file  Discontinued Medications   No medications on file     Physical Exam:  Filed Vitals:   07/12/13 1218  BP: 138/79  Pulse: 68  Temp: 97.6 F (36.4 C)  Resp: 20   Physical Exam  Constitutional: She is well-developed, well-nourished, and in no distress. No distress.  HENT:  Head: Normocephalic and atraumatic.  Mouth/Throat: Oropharynx is clear and moist. No oropharyngeal exudate.  Eyes: Conjunctivae and EOM are  normal. Pupils are equal, round, and reactive to light.  Neck: Normal range of motion. Neck supple.  Cardiovascular: Normal rate and normal heart sounds.  An irregular rhythm present.  Pulmonary/Chest: Effort normal and breath sounds normal. No respiratory distress.  Abdominal: Soft. Bowel sounds are normal. She exhibits no distension. There is no tenderness.  Musculoskeletal: She exhibits no edema.  Neurological: She is alert.  Skin: Skin is warm and dry. She is not diaphoretic.  Psychiatric: Affect normal.      Labs reviewed: Basic Metabolic Panel:  Recent Labs  78/46/96 1110 02/01/13 1451 06/24/13 1430  NA 137 136 137  K 3.7 4.2 3.6  CL 100 101 98  CO2 28 26 30     GLUCOSE 173* 178* 175*  BUN 18 22 15   CREATININE 1.25* 1.44* 1.11*  CALCIUM 9.6 9.0 9.7   Liver Function Tests:  Recent Labs  01/28/13 1110 02/01/13 1451 06/24/13 1430  AST 12 15 22   ALT 17 19 28   ALKPHOS 69 61 100  BILITOT 0.5 0.4 0.4  PROT 7.6 7.1 9.2*  ALBUMIN 3.0* 2.7* 3.1*   No results found for this basename: LIPASE, AMYLASE,  in the last 8760 hours  Recent Labs  01/29/13 1125  AMMONIA 18   CBC:  Recent Labs  11/25/12 0625  01/27/13 1918 01/28/13 1110 06/24/13 1430  WBC 7.1  < > 7.0 6.9 5.8  NEUTROABS 3.8  --  5.7 4.5  --   HGB 12.5  < > 13.8 13.5 14.8  HCT 36.9  < > 41.4 40.2 43.0  MCV 84.8  < > 85.9 85.4 85.5  PLT 371  < > 353 349 339  < > = values in this interval not displayed. Cardiac Enzymes:  Recent Labs  11/18/12 0330 06/24/13 1452  TROPONINI <0.30 <0.30   BNP: No components found with this basename: POCBNP,  CBG:  Recent Labs  02/02/13 1118 02/02/13 1625 06/24/13 1409  GLUCAP 195* 220* 159*   TSH:  Recent Labs  12/01/12 0725 01/28/13 1713  TSH 1.649 1.536   A1C: Lab Results  Component Value Date   HGBA1C 7.4* 01/28/2013   Lipid Panel:  Recent Labs  11/19/12 0535 01/29/13 0630  CHOL 122 116  HDL 72 74  LDLCALC 40 28  TRIG 48 71  CHOLHDL 1.7 1.6   CBC with Diff  Result: 03/02/2013 2:17 PM ( Status: F ) C  WBC 4.9 4.0-10.5 K/uL SLN  RBC 3.73 L 4.22-5.81 MIL/uL SLN  Hemoglobin 10.4 L 13.0-17.0 g/dL SLN  Hematocrit 29.5 L 39.0-52.0 % SLN  MCV 83.9 78.0-100.0 fL SLN  MCH 27.9 26.0-34.0 pg SLN  MCHC 33.2 30.0-36.0 g/dL SLN  RDW 28.4 13.2-44.0 % SLN  Platelet Count 343 150-400 K/uL SLN  Granulocyte % 48 43-77 % SLN  Absolute Gran 2.4 1.7-7.7 K/uL SLN  Lymph % 32 12-46 % SLN  Absolute Lymph 1.6 0.7-4.0 K/uL SLN  Mono % 14 H 3-12 % SLN  Absolute Mono 0.7 0.1-1.0 K/uL SLN  Eos % 6 H 0-5 % SLN  Absolute Eos 0.3 0.0-0.7 K/uL SLN  Baso % 0 0-1 % SLN  Absolute Baso 0.0 0.0-0.1 K/uL SLN  Smear Review Criteria for  review not met SLN  Comprehensive Metabolic Panel  Result: 03/02/2013 3:38 PM ( Status: F )  Sodium 139 135-145 mEq/L SLN  Potassium 4.1 3.5-5.3 mEq/L SLN  Chloride 104 96-112 mEq/L SLN  CO2 28 19-32 mEq/L SLN  Glucose 136 H 70-99 mg/dL SLN  BUN 27  H 6-23 mg/dL SLN  Creatinine 4.09 H 0.50-1.35 mg/dL SLN  Bilirubin, Total 0.4 0.3-1.2 mg/dL SLN  Alkaline Phosphatase 78 39-117 U/L SLN  AST/SGOT 13 0-37 U/L SLN  ALT/SGPT 20 0-53 U/L SLN  Total Protein 6.2 6.0-8.3 g/dL SLN  Albumin 2.8 L 8.1-1.9 g/dL SLN  Calcium 8.6 1.4-78.2 mg/dL SLN  BC with Diff  Result: 04/20/2013 2:36 PM ( Status: F ) C  WBC 4.6 4.0-10.5 K/uL SLN  RBC 3.89 3.87-5.11 MIL/uL SLN  Hemoglobin 10.9 L 12.0-15.0 g/dL SLN  Hematocrit 95.6 L 36.0-46.0 % SLN  MCV 84.6 78.0-100.0 fL SLN  MCH 28.0 26.0-34.0 pg SLN  MCHC 33.1 30.0-36.0 g/dL SLN  RDW 21.3 08.6-57.8 % SLN  Platelet Count 321 150-400 K/uL SLN  Granulocyte % 33 L 43-77 % SLN  Absolute Gran 1.5 L 1.7-7.7 K/uL SLN  Lymph % 40 12-46 % SLN  Absolute Lymph 1.9 0.7-4.0 K/uL SLN  Mono % 20 H 3-12 % SLN  Absolute Mono 0.9 0.1-1.0 K/uL SLN  Eos % 7 H 0-5 % SLN  Absolute Eos 0.3 0.0-0.7 K/uL SLN  Baso % 0 0-1 % SLN  Absolute Baso 0.0 0.0-0.1 K/uL SLN  Smear Review  cBC NO Diff (Complete Blood Count)  Result: 05/17/2013 3:33 PM ( Status: F ) C  WBC 5.1 4.0-10.5 K/uL SLN  RBC 4.01 3.87-5.11 MIL/uL SLN  Hemoglobin 11.3 L 12.0-15.0 g/dL SLN  Hematocrit 46.9 L 36.0-46.0 % SLN  MCV 83.3 78.0-100.0 fL SLN  MCH 28.2 26.0-34.0 pg SLN  MCHC 33.8 30.0-36.0 g/dL SLN  RDW 62.9 52.8-41.3 % SLN  Platelet Count 374 150-400 K/uL SLN  Comprehensive Metabolic Panel  Result: 05/17/2013 2:53 PM ( Status: F )  Sodium 135 135-145 mEq/L SLN  Potassium 4.1 3.5-5.3 mEq/L SLN  Chloride 100 96-112 mEq/L SLN  CO2 29 19-32 mEq/L SLN  Glucose 137 H 70-99 mg/dL SLN  BUN 28 H 2-44 mg/dL SLN  Creatinine 0.10 H 0.50-1.10 mg/dL SLN  Bilirubin, Total 0.3 0.3-1.2 mg/dL SLN  Alkaline  Phosphatase 105 39-117 U/L SLN  AST/SGOT 18 0-37 U/L SLN  ALT/SGPT 25 0-35 U/L SLN  Total Protein 6.9 6.0-8.3 g/dL SLN  Albumin 3.0 L 2.7-2.5 g/dL SLN  Calcium 9.0 3.6-64.4 mg/dL SLN  Lipid Profile  Result: 05/17/2013 2:53 PM ( Status: F )  Cholesterol 91 0-200 mg/dL SLN C  Triglyceride 66 <150 mg/dL SLN  HDL Cholesterol 47 >39 mg/dL SLN  Total Chol/HDL Ratio 1.9 Ratio SLN  VLDL Cholesterol (Calc) 13 0-34 mg/dL SLN  LDL Cholesterol (Calc) 31 7-42 mg/dL SLN C  Hemoglobin V9D  Result: 05/17/2013 7:12 PM ( Status: F )  Hemoglobin A1C 7.7 H <5.7 % SLN C  Estimated Average Glucose 174 H <117 mg/dL SLN   Assessment/Plan 1. Hypertension Stable on current medications  2. Diabetes mellitus with renal manifestations, controlled Stable at this time; will cont to follow cbgs and A1c; will not change medications at this time  3. Unspecified hereditary and idiopathic peripheral neuropathy Stable at this time  4. CVA (cerebral infarction) conts on xarelto; afib rate controlled  5. CKD (chronic kidney disease) stage 3, GFR 30-59 ml/min stable  6. OA (osteoarthritis) of knee conts to have pain, uses PRN with good relief

## 2013-08-16 ENCOUNTER — Encounter: Payer: Self-pay | Admitting: Nurse Practitioner

## 2013-08-16 ENCOUNTER — Non-Acute Institutional Stay (SKILLED_NURSING_FACILITY): Payer: PRIVATE HEALTH INSURANCE | Admitting: Nurse Practitioner

## 2013-08-16 DIAGNOSIS — F32A Depression, unspecified: Secondary | ICD-10-CM

## 2013-08-16 DIAGNOSIS — N183 Chronic kidney disease, stage 3 unspecified: Secondary | ICD-10-CM

## 2013-08-16 DIAGNOSIS — I10 Essential (primary) hypertension: Secondary | ICD-10-CM

## 2013-08-16 DIAGNOSIS — I482 Chronic atrial fibrillation, unspecified: Secondary | ICD-10-CM | POA: Insufficient documentation

## 2013-08-16 DIAGNOSIS — I4891 Unspecified atrial fibrillation: Secondary | ICD-10-CM

## 2013-08-16 DIAGNOSIS — G609 Hereditary and idiopathic neuropathy, unspecified: Secondary | ICD-10-CM

## 2013-08-16 DIAGNOSIS — I6350 Cerebral infarction due to unspecified occlusion or stenosis of unspecified cerebral artery: Secondary | ICD-10-CM

## 2013-08-16 DIAGNOSIS — D649 Anemia, unspecified: Secondary | ICD-10-CM

## 2013-08-16 DIAGNOSIS — G894 Chronic pain syndrome: Secondary | ICD-10-CM

## 2013-08-16 DIAGNOSIS — I639 Cerebral infarction, unspecified: Secondary | ICD-10-CM

## 2013-08-16 DIAGNOSIS — E1129 Type 2 diabetes mellitus with other diabetic kidney complication: Secondary | ICD-10-CM

## 2013-08-16 DIAGNOSIS — N058 Unspecified nephritic syndrome with other morphologic changes: Secondary | ICD-10-CM

## 2013-08-16 DIAGNOSIS — D638 Anemia in other chronic diseases classified elsewhere: Secondary | ICD-10-CM | POA: Insufficient documentation

## 2013-08-16 DIAGNOSIS — F3289 Other specified depressive episodes: Secondary | ICD-10-CM

## 2013-08-16 DIAGNOSIS — F329 Major depressive disorder, single episode, unspecified: Secondary | ICD-10-CM

## 2013-08-16 NOTE — Progress Notes (Signed)
Patient ID: [name], female   DOB: 06-07-1946, 68 y.o.   MRN: [id number]    Nursing Home Location:  [facility]   Place of Service: SNF (31)  PCP: Estill Dooms, MD  No Known Allergies  Chief Complaint  Patient presents with  . Medical Managment of Chronic Issues    HPI:  68 y.o.female with a PMH of hypertension, diabetes mellitus with peripheral neuropathy with recnt large acute left PCA infarct as well as small acute right PCA cortical infarct; who is being seen today for routine follow up on chronic conditions, pt without change in the last month; pt complaints today of ongoing knee and leg pain, pt had biofreeze added last month; pt reports medication helps and pain is not all the time but when she has pain it it usually because the medication does not last long enough, gets PRN tylenol   Review of Systems:  Review of Systems  Constitutional: Negative for fever, chills and malaise/fatigue.  Respiratory: Negative for cough and shortness of breath.   Cardiovascular: Negative for chest pain.  Gastrointestinal: Negative for heartburn, abdominal pain, diarrhea and constipation.  Genitourinary: Negative for dysuria, urgency and frequency.  Musculoskeletal: Positive for joint pain.  Skin: Negative.   Neurological: Negative for dizziness, weakness and headaches.  Psychiatric/Behavioral: Positive for memory loss. Negative for depression. The patient is not nervous/anxious and does not have insomnia.      Past Medical History  Diagnosis Date  . Hypertension   . Diabetes mellitus   . Glaucoma   . Cataract   . Stroke   . Renal disorder   . Atrial fibrillation    No past surgical history on file. Social History:   reports that she has never smoked. She does not have any smokeless tobacco history on file. She reports that she does not drink alcohol or use illicit drugs.  Family History  Problem Relation Age of Onset  . Stroke Mother   . Diabetes type II  Mother   . CAD Father     Medications: Patient's Medications  New Prescriptions   No medications on file  Previous Medications   ACETAMINOPHEN (TYLENOL) 325 MG TABLET    Take 650 mg by mouth every 6 (six) hours as needed for mild pain.   CITALOPRAM (CELEXA) 10 MG TABLET    Take 20 mg by mouth daily.    CLONIDINE (CATAPRES) 0.1 MG TABLET    Take 2 tablets (0.2 mg total) by mouth 2 (two) times daily.   DOCUSATE SODIUM (COLACE) 100 MG CAPSULE    Take 100 mg by mouth 2 (two) times daily.   GLIMEPIRIDE (AMARYL) 1 MG TABLET    Take 1 tablet (1 mg total) by mouth daily before breakfast.   PREGABALIN (LYRICA) 25 MG CAPSULE    Take 25 mg by mouth 3 (three) times daily.   RIVAROXABAN (XARELTO) 15 MG TABS TABLET    Take 1 tablet (15 mg total) by mouth daily with supper.   TRAMADOL (ULTRAM) 50 MG TABLET    Take 50 mg by mouth every 6 (six) hours as needed for moderate pain.  Modified Medications   No medications on file  Discontinued Medications   No medications on file     Physical Exam: Physical Exam  Constitutional: She is well-developed, well-nourished, and in no distress. No distress.  HENT:  Head: Normocephalic and atraumatic.  Mouth/Throat: Oropharynx is clear and moist. No oropharyngeal exudate.  Eyes: Conjunctivae and EOM are normal.  Pupils are equal, round, and reactive to light.  Neck: Normal range of motion. Neck supple.  Cardiovascular: Normal rate and normal heart sounds.  An irregular rhythm present.  Pulmonary/Chest: Effort normal and breath sounds normal. No respiratory distress.  Abdominal: Soft. Bowel sounds are normal. She exhibits no distension. There is no tenderness.  Musculoskeletal: She exhibits tenderness (to knees and LE; no edema or warmth present). She exhibits no edema.  Neurological: She is alert.  Skin: Skin is warm and dry. She is not diaphoretic.  Psychiatric: She has a flat affect.     Filed Vitals:   08/16/13 1123  BP: 115/47  Pulse: 70  Temp: 97.1  F (36.2 C)  Resp: 20      Labs reviewed: Basic Metabolic Panel:  Recent Labs  01/28/13 1110 02/01/13 1451 06/24/13 1430  NA 137 136 137  K 3.7 4.2 3.6  CL 100 101 98  CO2 _0 GLUCOSE 173* 178* 175*  BUN _1 CREATININE 1.25* 1.44* 1.11*  CALCIUM 9.6 9.0 9.7   Liver Function Tests:  Recent Labs  01/28/13 1110 02/01/13 1451 06/24/13 1430  AST _2 ALT _3 ALKPHOS 69 61 100  BILITOT 0.5 0.4 0.4  PROT 7.6 7.1 9.2*  ALBUMIN 3.0* 2.7* 3.1*   No results found for this basename: LIPASE, AMYLASE,  in the last 8760 hours  Recent Labs  01/29/13 1125  AMMONIA 18   CBC:  Recent Labs  11/25/12 0625  01/27/13 1918 01/28/13 1110 06/24/13 1430  WBC 7.1  < > 7.0 6.9 5.8  NEUTROABS 3.8  --  5.7 4.5  --   HGB 12.5  < > 13.8 13.5 14.8  HCT 36.9  < > 41.4 40.2 43.0  MCV 84.8  < > 85.9 85.4 85.5  PLT 371  < > 353 349 339  < > = values in this interval not displayed. Cardiac Enzymes:  Recent Labs  11/18/12 0330 06/24/13 1452  TROPONINI <0.30 <0.30   BNP: No components found with this basename: POCBNP,  CBG:  Recent Labs  02/02/13 1118 02/02/13 1625 06/24/13 1409  GLUCAP 195* 220* 159*   TSH:  Recent Labs  12/01/12 0725 01/28/13 1713  TSH 1.649 1.536   A1C: Lab Results  Component Value Date   HGBA1C 7.4* 01/28/2013   Lipid Panel:  Recent Labs  11/19/12 0535 01/29/13 0630  CHOL 122 116  HDL 72 74  LDLCALC 40 28  TRIG 48 71  CHOLHDL 1.7 1.6  CBC with Diff  Result: 03/02/2013 2:17 PM ( Status: F ) C  WBC 4.9 4.0-10.5 K/uL SLN  RBC 3.73 L 4.22-5.81 MIL/uL SLN  Hemoglobin 10.4 L 13.0-17.0 g/dL SLN  Hematocrit 31.3 L 39.0-52.0 % SLN  MCV 83.9 78.0-100.0 fL SLN  MCH 27.9 26.0-34.0 pg SLN  MCHC 33.2 30.0-36.0 g/dL SLN  RDW 14.1 11.5-15.5 % SLN  Platelet Count 343 150-400 K/uL SLN  Granulocyte % 48 43-77 % SLN  Absolute Gran 2.4 1.7-7.7 K/uL SLN  Lymph % 32 12-46 % SLN  Absolute Lymph 1.6 0.7-4.0 K/uL SLN  Mono %  14 H 3-12 % SLN  Absolute Mono 0.7 0.1-1.0 K/uL SLN  Eos % 6 H 0-5 % SLN  Absolute Eos 0.3 0.0-0.7 K/uL SLN  Baso % 0 0-1 % SLN  Absolute Baso 0.0 0.0-0.1 K/uL SLN  Smear Review Criteria for review not met SLN  Comprehensive Metabolic Panel  Result: 5/85/2778 3:38 PM (  Status: F )  Sodium 139 135-145 mEq/L SLN  Potassium 4.1 3.5-5.3 mEq/L SLN  Chloride 104 96-112 mEq/L SLN  CO2 28 19-32 mEq/L SLN  Glucose 136 H 70-99 mg/dL SLN  BUN 27 H 6-23 mg/dL SLN  Creatinine 1.39 H 0.50-1.35 mg/dL SLN  Bilirubin, Total 0.4 0.3-1.2 mg/dL SLN  Alkaline Phosphatase 78 39-117 U/L SLN  AST/SGOT 13 0-37 U/L SLN  ALT/SGPT 20 0-53 U/L SLN  Total Protein 6.2 6.0-8.3 g/dL SLN  Albumin 2.8 L 3.5-5.2 g/dL SLN  Calcium 8.6 8.4-10.5 mg/dL SLN  BC with Diff  Result: 04/20/2013 2:36 PM ( Status: F ) C  WBC 4.6 4.0-10.5 K/uL SLN  RBC 3.89 3.87-5.11 MIL/uL SLN  Hemoglobin 10.9 L 12.0-15.0 g/dL SLN  Hematocrit 32.9 L 36.0-46.0 % SLN  MCV 84.6 78.0-100.0 fL SLN  MCH 28.0 26.0-34.0 pg SLN  MCHC 33.1 30.0-36.0 g/dL SLN  RDW 13.8 11.5-15.5 % SLN  Platelet Count 321 150-400 K/uL SLN  Granulocyte % 33 L 43-77 % SLN  Absolute Gran 1.5 L 1.7-7.7 K/uL SLN  Lymph % 40 12-46 % SLN  Absolute Lymph 1.9 0.7-4.0 K/uL SLN  Mono % 20 H 3-12 % SLN  Absolute Mono 0.9 0.1-1.0 K/uL SLN  Eos % 7 H 0-5 % SLN  Absolute Eos 0.3 0.0-0.7 K/uL SLN  Baso % 0 0-1 % SLN  Absolute Baso 0.0 0.0-0.1 K/uL SLN  Smear Review  cBC NO Diff (Complete Blood Count)  Result: 05/17/2013 3:33 PM ( Status: F ) C  WBC 5.1 4.0-10.5 K/uL SLN  RBC 4.01 3.87-5.11 MIL/uL SLN  Hemoglobin 11.3 L 12.0-15.0 g/dL SLN  Hematocrit 33.4 L 36.0-46.0 % SLN  MCV 83.3 78.0-100.0 fL SLN  MCH 28.2 26.0-34.0 pg SLN  MCHC 33.8 30.0-36.0 g/dL SLN  RDW 14.1 11.5-15.5 % SLN  Platelet Count 374 150-400 K/uL SLN  Comprehensive Metabolic Panel  Result: 78/01/7543 2:53 PM ( Status: F )  Sodium 135 135-145 mEq/L SLN  Potassium 4.1 3.5-5.3 mEq/L SLN  Chloride 100  96-112 mEq/L SLN  CO2 29 19-32 mEq/L SLN  Glucose 137 H 70-99 mg/dL SLN  BUN 28 H 6-23 mg/dL SLN  Creatinine 1.39 H 0.50-1.10 mg/dL SLN  Bilirubin, Total 0.3 0.3-1.2 mg/dL SLN  Alkaline Phosphatase 105 39-117 U/L SLN  AST/SGOT 18 0-37 U/L SLN  ALT/SGPT 25 0-35 U/L SLN  Total Protein 6.9 6.0-8.3 g/dL SLN  Albumin 3.0 L 3.5-5.2 g/dL SLN  Calcium 9.0 8.4-10.5 mg/dL SLN  Lipid Profile  Result: 05/17/2013 2:53 PM ( Status: F )  Cholesterol 91 0-200 mg/dL SLN C  Triglyceride 66 <150 mg/dL SLN  HDL Cholesterol 47 >39 mg/dL SLN  Total Chol/HDL Ratio 1.9 Ratio SLN  VLDL Cholesterol (Calc) 13 0-40 mg/dL SLN  LDL Cholesterol (Calc) 31 0-99 mg/dL SLN C  Hemoglobin A1C  Result: 05/17/2013 7:12 PM ( Status: F )  Hemoglobin A1C 7.7 H <5.7 % SLN C  Estimated Average Glucose 174 H <117 mg/dL SLN CBC NO Diff (Complete Blood Count)    Result: 07/18/2013 4:23 PM   ( Status: F )       WBC 5.1     4.0-10.5 K/uL SLN   RBC 4.69     3.87-5.11 MIL/uL SLN   Hemoglobin 13.5     12.0-15.0 g/dL SLN   Hematocrit 39.1     36.0-46.0 % SLN   MCV 83.4     78.0-100.0 fL SLN   MCH 28.8     26.0-34.0 pg SLN   MCHC 34.5  30.0-36.0 g/dL SLN   RDW 14.9     11.5-15.5 % SLN   Platelet Count 272     150-400 K/uL SLN   Basic Metabolic Panel    Result: 07/18/2013 5:09 PM   ( Status: F )       Sodium 135     135-145 mEq/L SLN   Potassium 4.5     3.5-5.3 mEq/L SLN   Chloride 102     96-112 mEq/L SLN   CO2 28     19-32 mEq/L SLN   Glucose 145   H 70-99 mg/dL SLN   BUN 28   H 6-23 mg/dL SLN   Creatinine 1.69   H 0.50-1.10 mg/dL SLN   Calcium 9.2     8.4-10.5 mg/dL SLN   Hemoglobin A1C    Result: 07/18/2013 7:18 PM   ( Status: F )       Hemoglobin A1C 8.0   H <5.7 % SLN C Estimated Average Glucose 183   H <117 mg/dL SLN CBC with Diff    Result: 08/02/2013 11:09 PM   ( Status: F )       WBC 5.3     4.0-10.5 K/uL SLN   RBC 4.40     3.87-5.11 MIL/uL SLN   Hemoglobin 12.3     12.0-15.0 g/dL SLN   Hematocrit 36.2       36.0-46.0 % SLN   MCV 82.3     78.0-100.0 fL SLN   MCH 28.0     26.0-34.0 pg SLN   MCHC 34.0     30.0-36.0 g/dL SLN   RDW 15.6   H 11.5-15.5 % SLN   Platelet Count 302     150-400 K/uL SLN   Granulocyte % 46     43-77 % SLN   Absolute Gran 2.4     1.7-7.7 K/uL SLN   Lymph % 35     12-46 % SLN   Absolute Lymph 1.9     0.7-4.0 K/uL SLN   Mono % 15   H 3-12 % SLN   Absolute Mono 0.8     0.1-1.0 K/uL SLN   Eos % 4     0-5 % SLN   Absolute Eos 0.2     0.0-0.7 K/uL SLN   Baso % 0     0-1 % SLN   Absolute Baso 0.0    Assessment/Plan 1. Hypertension -Patient is stable; continue current regimen. Will monitor and make changes as necessary.  2. CVA (cerebral infarction) -stable conts on xarelto   3. CKD (chronic kidney disease) stage 3, GFR 30-59 ml/min - increase in Cr; will cont to monitor  4. Depression -unchanged, will cont celexa   5. Unspecified hereditary and idiopathic peripheral neuropathy -will increase lyrica to TID  6. Diabetes mellitus with renal manifestations, controlled -A1c has increased since last lab -will stop amaryl and start tradjenta 5 mg daily -will increase cbgs to BID and cont to monitor  7. GERD -pt on omeprazole however without complaints of GERD -will dc omeprazole and monitor for worsen symptoms  8 Chronic Pain -pt with chronic knee and leg pain -currently on scheduled tramadol, lyrica and PRN tylenol -pt reports medication not lasting long enough, will increase lyrica to TID to see if this helps with her pain   9. Anemia -stable on current labs   10. afib Rate controlled; conts on xarelto

## 2013-08-17 ENCOUNTER — Other Ambulatory Visit: Payer: Self-pay | Admitting: *Deleted

## 2013-08-17 MED ORDER — PREGABALIN 25 MG PO CAPS: 25.0000 mg | ORAL_CAPSULE | Freq: Three times a day (TID) | ORAL | Status: DC

## 2013-08-17 MED ORDER — PREGABALIN 50 MG PO CAPS: 50.0000 mg | ORAL_CAPSULE | Freq: Three times a day (TID) | ORAL | Status: DC

## 2013-09-05 ENCOUNTER — Non-Acute Institutional Stay (SKILLED_NURSING_FACILITY): Payer: PRIVATE HEALTH INSURANCE | Admitting: Internal Medicine

## 2013-09-05 DIAGNOSIS — R609 Edema, unspecified: Secondary | ICD-10-CM

## 2013-09-05 DIAGNOSIS — I10 Essential (primary) hypertension: Secondary | ICD-10-CM

## 2013-09-05 DIAGNOSIS — N183 Chronic kidney disease, stage 3 unspecified: Secondary | ICD-10-CM

## 2013-09-05 DIAGNOSIS — E1129 Type 2 diabetes mellitus with other diabetic kidney complication: Secondary | ICD-10-CM

## 2013-09-05 DIAGNOSIS — F3289 Other specified depressive episodes: Secondary | ICD-10-CM

## 2013-09-05 DIAGNOSIS — I6350 Cerebral infarction due to unspecified occlusion or stenosis of unspecified cerebral artery: Secondary | ICD-10-CM

## 2013-09-05 DIAGNOSIS — F329 Major depressive disorder, single episode, unspecified: Secondary | ICD-10-CM

## 2013-09-05 DIAGNOSIS — I639 Cerebral infarction, unspecified: Secondary | ICD-10-CM

## 2013-09-05 DIAGNOSIS — R600 Localized edema: Secondary | ICD-10-CM

## 2013-09-05 DIAGNOSIS — F32A Depression, unspecified: Secondary | ICD-10-CM

## 2013-09-05 DIAGNOSIS — I4891 Unspecified atrial fibrillation: Secondary | ICD-10-CM

## 2013-09-05 DIAGNOSIS — G609 Hereditary and idiopathic neuropathy, unspecified: Secondary | ICD-10-CM

## 2013-09-06 ENCOUNTER — Encounter: Payer: Self-pay | Admitting: Internal Medicine

## 2013-09-06 NOTE — Assessment & Plan Note (Signed)
Lyrica increased to TID for better pain control

## 2013-09-06 NOTE — Progress Notes (Signed)
MRN: [id number] Name: Josceline Chenard  Sex: female Age: 68 y.o. DOB: January 21, 1946  [facility] #: [facility] Facility/Room: 201A Level Of Care: SNF Provider: Merrilee Seashore D Emergency Contacts: Extended Emergency Contact Information Primary Emergency Contact: [name] [place] Mobile Phone: [phone] Relation: Daughter Secondary Emergency Contact: [name],[name] Address: [address]          [place], [place] Home Phone: [phone] Relation: Sister  Code Status: FULL  Allergies: Review of patient's allergies indicates no known allergies.  Chief Complaint  Patient presents with  . Medical Managment of Chronic Issues    HPI: Patient is 68 y.o. female who is being seen for multiple medical problems.  Past Medical History  Diagnosis Date  . Hypertension   . Diabetes mellitus   . Glaucoma   . Cataract   . Stroke   . Renal disorder   . Atrial fibrillation     History reviewed. No pertinent past surgical history.    Medication List       This list is accurate as of: 09/05/13 11:59 PM.  Always use your most recent med list.               acetaminophen 325 MG tablet  Commonly known as:  TYLENOL  Take 650 mg by mouth every 6 (six) hours as needed for mild pain.     citalopram 10 MG tablet  Commonly known as:  CELEXA  Take 20 mg by mouth daily.     cloNIDine 0.1 MG tablet  Commonly known as:  CATAPRES  Take 2 tablets (0.2 mg total) by mouth 2 (two) times daily.     docusate sodium 100 MG capsule  Commonly known as:  COLACE  Take 100 mg by mouth 2 (two) times daily.     glimepiride 1 MG tablet  Commonly known as:  AMARYL  Take 1 tablet (1 mg total) by mouth daily before breakfast.     pregabalin 50 MG capsule  Commonly known as:  LYRICA  Take 1 capsule (50 mg total) by mouth 3 (three) times daily.     Rivaroxaban 15 MG Tabs tablet  Commonly known as:  XARELTO  Take 1 tablet (15 mg total) by mouth daily with  supper.     traMADol 50 MG tablet  Commonly known as:  ULTRAM  Take 50 mg by mouth every 6 (six) hours as needed for moderate pain.        No orders of the defined types were placed in this encounter.     There is no immunization history on file for this patient.  History  Substance Use Topics  . Smoking status: Never Smoker   . Smokeless tobacco: Not on file  . Alcohol Use: No    Review of Systems  DATA OBTAINED: from patient; pt has no c/o or needs GENERAL: Feels well no fevers, fatigue, appetite changes SKIN: No itching, rash HEENT: No complaint RESPIRATORY: No cough, wheezing, SOB CARDIAC: No chest pain, palpitations,chronic  lower extremity edema  GI: No abdominal pain, No N/V/D or constipation, No heartburn or reflux  GU: No dysuria, frequency or urgency, or incontinence  MUSCULOSKELETAL: No unrelieved bone/joint pain NEUROLOGIC: No headache, dizziness or focal weakness PSYCHIATRIC: No overt anxiety or sadness. Sleeps well.   Filed Vitals:   09/06/13 2018  BP: 134/54  Pulse: 84  Temp: 97.1 F (36.2 C)  Resp: 20    Physical Exam  GENERAL APPEARANCE: Alert, conversant. Appropriately groomed. No  acute distress  SKIN: No diaphoresis rash, or wounds HEENT: Unremarkable RESPIRATORY: Breathing is even, unlabored. Lung sounds are clear   CARDIOVASCULAR: Heart RRR no murmurs, rubs or gallops. 1+ peripheral edema  GASTROINTESTINAL: Abdomen is soft, non-tender, not distended w/ normal bowel sounds.  GENITOURINARY: Bladder non tender, not distended  MUSCULOSKELETAL: No abnormal joints or musculature NEUROLOGIC: Cranial nerves 2-12 grossly intact. Moves all extremities no tremor. PSYCHIATRIC: Mood and affect appropriate to situation, no behavioral issues  Patient Active Problem List   Diagnosis Date Noted  . Atrial fibrillation 08/16/2013  . Anemia 08/16/2013  . Chronic pain syndrome 08/16/2013  . Altered mental status 01/28/2013  . Hypertension 01/28/2013  .  Unspecified hereditary and idiopathic peripheral neuropathy 01/28/2013  . Depression 01/28/2013  . CVA (cerebral infarction) 11/25/2012  . Hypertension, accelerated 11/18/2012  . New Onset Atrial fibrillation with RVR 11/18/2012  . CKD (chronic kidney disease) stage 3, GFR 30-59 ml/min 11/18/2012  . Diabetes mellitus with renal manifestations, controlled 11/18/2012  . Headache 11/18/2012  . Acute cerebral infarction/Large acute left PCA infarct & Small acute right PCA cortical infarct involving the occipital 11/18/2012    CBC    Component Value Date/Time   WBC 5.8 06/24/2013 1430   RBC 5.03 06/24/2013 1430   HGB 14.8 06/24/2013 1430   HCT 43.0 06/24/2013 1430   PLT 339 06/24/2013 1430   MCV 85.5 06/24/2013 1430   LYMPHSABS 1.5 01/28/2013 1110   MONOABS 0.8 01/28/2013 1110   EOSABS 0.1 01/28/2013 1110   BASOSABS 0.0 01/28/2013 1110    CMP     Component Value Date/Time   NA 137 06/24/2013 1430   K 3.6 06/24/2013 1430   CL 98 06/24/2013 1430   CO2 30 06/24/2013 1430   GLUCOSE 175* 06/24/2013 1430   BUN 15 06/24/2013 1430   CREATININE 1.11* 06/24/2013 1430   CALCIUM 9.7 06/24/2013 1430   PROT 9.2* 06/24/2013 1430   ALBUMIN 3.1* 06/24/2013 1430   AST 22 06/24/2013 1430   ALT 28 06/24/2013 1430   ALKPHOS 100 06/24/2013 1430   BILITOT 0.4 06/24/2013 1430   GFRNONAA 51* 06/24/2013 1430   GFRAA 59* 06/24/2013 1430    Assessment and Plan  Atrial fibrillation Rate controlled on no meds;xarelto for prophylaxis  Hypertension, accelerated BP is now very well controlled on clonidine 0.1  BID; continue monitor  Diabetes mellitus with renal manifestations, controlled HbA1c has increased to 8.0; d/c amaryl and start tradjenta 5mg ; increase CBG to BID and continue monitor  CKD (chronic kidney disease) stage 3, GFR 30-59 ml/min Cr 1.69, up from prior; prob progession of dx;will monitor  CVA (cerebral infarction) No changes;pt is on xarelto  Unspecified hereditary and  idiopathic peripheral neuropathy Lyrica increased to TID for better pain control  Depression Stable on celexa; continue same dose  LOWER EXTREMITY EDEMA - pt agrees to a trial of compression stockings, thigh high  [name], ANNE D, MD

## 2013-09-06 NOTE — Assessment & Plan Note (Signed)
Rate controlled on no meds;xarelto for prophylaxis

## 2013-09-06 NOTE — Assessment & Plan Note (Signed)
BP is now very well controlled on clonidine 0.1  BID; continue monitor

## 2013-09-06 NOTE — Assessment & Plan Note (Signed)
Cr 1.69, up from prior; prob progession of dx;will monitor

## 2013-09-06 NOTE — Assessment & Plan Note (Signed)
Stable on celexa; continue same dose

## 2013-09-06 NOTE — Assessment & Plan Note (Addendum)
HbA1c has increased to 8.0; d/c amaryl and start tradjenta 5mg ; increase CBG to BID and continue monitor

## 2013-09-06 NOTE — Assessment & Plan Note (Signed)
No changes;pt is on xarelto

## 2013-09-15 ENCOUNTER — Encounter: Payer: Self-pay | Admitting: Internal Medicine

## 2013-09-15 ENCOUNTER — Non-Acute Institutional Stay (SKILLED_NURSING_FACILITY): Payer: PRIVATE HEALTH INSURANCE | Admitting: Internal Medicine

## 2013-09-15 DIAGNOSIS — I10 Essential (primary) hypertension: Secondary | ICD-10-CM

## 2013-09-15 DIAGNOSIS — D649 Anemia, unspecified: Secondary | ICD-10-CM

## 2013-09-15 DIAGNOSIS — N183 Chronic kidney disease, stage 3 unspecified: Secondary | ICD-10-CM

## 2013-09-15 DIAGNOSIS — I4891 Unspecified atrial fibrillation: Secondary | ICD-10-CM

## 2013-09-15 DIAGNOSIS — E1129 Type 2 diabetes mellitus with other diabetic kidney complication: Secondary | ICD-10-CM

## 2013-09-15 DIAGNOSIS — M25569 Pain in unspecified knee: Secondary | ICD-10-CM

## 2013-09-15 NOTE — Assessment & Plan Note (Signed)
HbA1c 2/4 was 7.5; appears to be improving on trajenta;will continue

## 2013-09-15 NOTE — Assessment & Plan Note (Signed)
09/14/2013  H/H   11.1/32.9  MCV 84.1; falling; iron def anemia vs anemia chronic dx

## 2013-09-15 NOTE — Assessment & Plan Note (Signed)
No change; still well controlled on clonidine 0.1 mg BID

## 2013-09-15 NOTE — Assessment & Plan Note (Signed)
GFR 37, CrCl 55; 2/4 BUN/Cr 34/1.88; will continue monitor;

## 2013-09-15 NOTE — Progress Notes (Signed)
MRN: [id number] Name: [name]  Sex: female Age: 68 y.o. DOB: 1946-02-09  [facility] #: [facility] Facility/Room:  201a Level Of Care: SNF Provider: Merrilee Seashore D Emergency Contacts: Extended Emergency Contact Information Primary Emergency Contact: [name] [place] Mobile Phone: [phone] Relation: Daughter Secondary Emergency Contact: [name],[name] Address: [address]          [place], [place] Home Phone: [phone] Relation: Sister  Code Status:   Allergies: Review of patient's allergies indicates no known allergies.  Chief Complaint  Patient presents with  . Medical Managment of Chronic Issues    HPI: Patient is 68 y.o. female who is being seen for chronic medical problems.  Past Medical History  Diagnosis Date  . Hypertension   . Diabetes mellitus   . Glaucoma   . Cataract   . Stroke   . Renal disorder   . Atrial fibrillation     History reviewed. No pertinent past surgical history.    Medication List       This list is accurate as of: 09/15/13 10:38 PM.  Always use your most recent med list.               acetaminophen 325 MG tablet  Commonly known as:  TYLENOL  Take 650 mg by mouth every 6 (six) hours as needed for mild pain.     citalopram 10 MG tablet  Commonly known as:  CELEXA  Take 20 mg by mouth daily.     cloNIDine 0.1 MG tablet  Commonly known as:  CATAPRES  Take 2 tablets (0.2 mg total) by mouth 2 (two) times daily.     docusate sodium 100 MG capsule  Commonly known as:  COLACE  Take 100 mg by mouth 2 (two) times daily.     glimepiride 1 MG tablet  Commonly known as:  AMARYL  Take 1 tablet (1 mg total) by mouth daily before breakfast.     pregabalin 50 MG capsule  Commonly known as:  LYRICA  Take 1 capsule (50 mg total) by mouth 3 (three) times daily.     Rivaroxaban 15 MG Tabs tablet  Commonly known as:  XARELTO  Take 1 tablet (15 mg total) by mouth daily with supper.      traMADol 50 MG tablet  Commonly known as:  ULTRAM  Take 50 mg by mouth every 6 (six) hours as needed for moderate pain.        No orders of the defined types were placed in this encounter.     There is no immunization history on file for this patient.  History  Substance Use Topics  . Smoking status: Never Smoker   . Smokeless tobacco: Not on file  . Alcohol Use: No    Review of Systems  DATA OBTAINED: from patient, GENERAL: Feels well no fevers, fatigue, appetite changes SKIN: No itching, rash HEENT: No complaint RESPIRATORY: No cough, wheezing, SOB CARDIAC: No chest pain, palpitations, lower extremity edema  GI: No abdominal pain, No N/V/D or constipation, No heartburn or reflux  GU: No dysuria, frequency or urgency, or incontinence  MUSCULOSKELETAL: c/o R knee pain NEUROLOGIC: No headache, dizziness or focal weakness PSYCHIATRIC: No overt anxiety or sadness. Sleeps well.   Filed Vitals:   09/15/13 2216  BP: 132/66  Pulse: 75  Temp: 98.1 F (36.7 C)  Resp: 18    Physical Exam  GENERAL APPEARANCE: Alert, conversant. Appropriately groomed. No acute distress  SKIN: No diaphoresis  rash, or wounds HEENT: Unremarkable RESPIRATORY: Breathing is even, unlabored. Lung sounds are clear   CARDIOVASCULAR: Heart RRR no murmurs, rubs or gallops. No peripheral edema  GASTROINTESTINAL: Abdomen is soft, non-tender, not distended w/ normal bowel sounds.  GENITOURINARY: Bladder non tender, not distended  MUSCULOSKELETAL: no swelling or fluid but very tender over medial joint line NEUROLOGIC: Cranial nerves 2-12 grossly intact. Moves all extremities no tremor. PSYCHIATRIC: Mood and affect appropriate to situation, no behavioral issues  Patient Active Problem List   Diagnosis Date Noted  . Atrial fibrillation 08/16/2013  . Anemia 08/16/2013  . Chronic pain syndrome 08/16/2013  . Altered mental status 01/28/2013  . Hypertension 01/28/2013  . Unspecified hereditary and  idiopathic peripheral neuropathy 01/28/2013  . Depression 01/28/2013  . CVA (cerebral infarction) 11/25/2012  . Hypertension, accelerated 11/18/2012  . New Onset Atrial fibrillation with RVR 11/18/2012  . CKD (chronic kidney disease) stage 3, GFR 30-59 ml/min 11/18/2012  . Diabetes mellitus with renal manifestations, controlled 11/18/2012  . Headache 11/18/2012  . Acute cerebral infarction/Large acute left PCA infarct & Small acute right PCA cortical infarct involving the occipital 11/18/2012    CBC    Component Value Date/Time   WBC 5.8 06/24/2013 1430   RBC 5.03 06/24/2013 1430   HGB 14.8 06/24/2013 1430   HCT 43.0 06/24/2013 1430   PLT 339 06/24/2013 1430   MCV 85.5 06/24/2013 1430   LYMPHSABS 1.5 01/28/2013 1110   MONOABS 0.8 01/28/2013 1110   EOSABS 0.1 01/28/2013 1110   BASOSABS 0.0 01/28/2013 1110    CMP     Component Value Date/Time   NA 137 06/24/2013 1430   K 3.6 06/24/2013 1430   CL 98 06/24/2013 1430   CO2 30 06/24/2013 1430   GLUCOSE 175* 06/24/2013 1430   BUN 15 06/24/2013 1430   CREATININE 1.11* 06/24/2013 1430   CALCIUM 9.7 06/24/2013 1430   PROT 9.2* 06/24/2013 1430   ALBUMIN 3.1* 06/24/2013 1430   AST 22 06/24/2013 1430   ALT 28 06/24/2013 1430   ALKPHOS 100 06/24/2013 1430   BILITOT 0.4 06/24/2013 1430   GFRNONAA 51* 06/24/2013 1430   GFRAA 59* 06/24/2013 1430    Assessment and Plan  Atrial fibrillation No change from prior; continue xarelto;pt's rhythm is irreg today  Hypertension, accelerated No change; still well controlled on clonidine 0.1 mg BID  CKD (chronic kidney disease) stage 3, GFR 30-59 ml/min GFR 37, CrCl 55; 2/4 BUN/Cr 34/1.88; will continue monitor;   Diabetes mellitus with renal manifestations, controlled HbA1c 2/4 was 7.5; appears to be improving on trajenta;will continue  Anemia 09/14/2013  H/H   11.1/32.9  MCV 84.1; falling; iron def anemia vs anemia chronic dx   R KNEE PAIN- tender over medial joint line; could be a  cartilage tear, could be arthritis, could be both; have written for stronger pain medication because she is very uncomfortable; U/S is neg  [name], TRUE Shackleford D, MD

## 2013-09-15 NOTE — Assessment & Plan Note (Signed)
No change from prior; continue xarelto;pt's rhythm is irreg today

## 2013-11-24 ENCOUNTER — Other Ambulatory Visit: Payer: Self-pay | Admitting: *Deleted

## 2013-11-24 MED ORDER — HYDROCODONE-ACETAMINOPHEN 5-325 MG PO TABS: ORAL_TABLET | ORAL | Status: DC

## 2013-11-24 NOTE — Telephone Encounter (Signed)
[facility] 

## 2013-11-28 ENCOUNTER — Other Ambulatory Visit: Payer: Self-pay | Admitting: *Deleted

## 2013-11-28 MED ORDER — TRAMADOL HCL 50 MG PO TABS: ORAL_TABLET | ORAL | Status: DC

## 2013-11-28 NOTE — Telephone Encounter (Signed)
[facility] 

## 2013-12-08 ENCOUNTER — Non-Acute Institutional Stay (SKILLED_NURSING_FACILITY): Payer: PRIVATE HEALTH INSURANCE | Admitting: Internal Medicine

## 2013-12-08 ENCOUNTER — Encounter: Payer: Self-pay | Admitting: Internal Medicine

## 2013-12-08 DIAGNOSIS — J309 Allergic rhinitis, unspecified: Secondary | ICD-10-CM

## 2013-12-08 DIAGNOSIS — F329 Major depressive disorder, single episode, unspecified: Secondary | ICD-10-CM

## 2013-12-08 DIAGNOSIS — F32A Depression, unspecified: Secondary | ICD-10-CM

## 2013-12-08 DIAGNOSIS — I4891 Unspecified atrial fibrillation: Secondary | ICD-10-CM

## 2013-12-08 DIAGNOSIS — E1129 Type 2 diabetes mellitus with other diabetic kidney complication: Secondary | ICD-10-CM

## 2013-12-08 DIAGNOSIS — G609 Hereditary and idiopathic neuropathy, unspecified: Secondary | ICD-10-CM

## 2013-12-08 DIAGNOSIS — I10 Essential (primary) hypertension: Secondary | ICD-10-CM

## 2013-12-08 DIAGNOSIS — F3289 Other specified depressive episodes: Secondary | ICD-10-CM

## 2013-12-08 DIAGNOSIS — N184 Chronic kidney disease, stage 4 (severe): Secondary | ICD-10-CM

## 2013-12-08 NOTE — Assessment & Plan Note (Signed)
Toprol XL 100 mg and cardizem LA 360 mg for rate control;xarelto 15 mg for prophylaxis

## 2013-12-08 NOTE — Assessment & Plan Note (Addendum)
Tradgenta 5 mg to continue; no ACE or ARB 2/2 renal function

## 2013-12-08 NOTE — Assessment & Plan Note (Signed)
Lyrica 50 mg TID to continue

## 2013-12-08 NOTE — Progress Notes (Signed)
MRN: [id number] Name: [name] Crumpacker  Sex: female Age: 68 y.o. DOB: 12/24/1945  [facility] Facility/Room: [facility]A Level Of Care: SNF Provider: Margit HanksAnne D Kavan Devan Emergency Contacts: Extended Emergency Contact Information Primary Emergency Contact: Rosanne AshingMcLean,Ivy  [place] Mobile Phone: [phone] Relation: Daughter Secondary Emergency Contact: [name],[name] Address: [address]          [place], [place] Home Phone: [phone] Relation: Sister  Code Status: FULL  Allergies: Review of patient's allergies indicates no known allergies.  Chief Complaint  Patient presents with  . Medical Management of Chronic Issues    HPI: Patient is 68 y.o. female who   Past Medical History  Diagnosis Date  . Hypertension   . Diabetes mellitus   . Glaucoma   . Cataract   . Stroke   . Renal disorder   . Atrial fibrillation     History reviewed. No pertinent past surgical history.    Medication List       This list is accurate as of: 12/08/13 10:25 PM.  Always use your most recent med list.               acetaminophen 325 MG tablet  Commonly known as:  TYLENOL  Take 650 mg by mouth every 6 (six) hours as needed for mild pain.     citalopram 10 MG tablet  Commonly known as:  CELEXA  Take 20 mg by mouth daily.     cloNIDine 0.1 MG tablet  Commonly known as:  CATAPRES  Take 2 tablets (0.2 mg total) by mouth 2 (two) times daily.     docusate sodium 100 MG capsule  Commonly known as:  COLACE  Take 100 mg by mouth 2 (two) times daily.     glimepiride 1 MG tablet  Commonly known as:  AMARYL  Take 1 tablet (1 mg total) by mouth daily before breakfast.     HYDROcodone-acetaminophen 5-325 MG per tablet  Commonly known as:  NORCO/VICODIN  Take one tablet by mouth every 6 hours as needed for pain     pregabalin 50 MG capsule  Commonly known as:  LYRICA  Take 1 capsule (50 mg total) by mouth 3 (three) times daily.      Rivaroxaban 15 MG Tabs tablet  Commonly known as:  XARELTO  Take 1 tablet (15 mg total) by mouth daily with supper.     traMADol 50 MG tablet  Commonly known as:  ULTRAM  Take one tablet by mouth three times daily for pain. Hold for sedation        No orders of the defined types were placed in this encounter.     There is no immunization history on file for this patient.  History  Substance Use Topics  . Smoking status: Never Smoker   . Smokeless tobacco: Not on file  . Alcohol Use: No    Review of Systems  DATA OBTAINED: from patient; no c/o GENERAL: Feels well no fevers, fatigue, appetite changes SKIN: No itching, rash HEENT: No complaint RESPIRATORY: No cough, wheezing, SOB CARDIAC: No chest pain, palpitations, lower extremity edema  GI: No abdominal pain, No N/V/D or constipation, No heartburn or reflux  GU: No dysuria, frequency or urgency, or incontinence  MUSCULOSKELETAL: No unrelieved bone/joint pain NEUROLOGIC: No headache, dizziness or focal weakness PSYCHIATRIC: No overt anxiety or sadness. Sleeps well.   Filed Vitals:   12/08/13 2206  BP: 136/65  Pulse: 83  Temp: 98.1 F (36.7 C)  Resp: 20    Physical Exam  GENERAL APPEARANCE: Alert, modconversant. Appropriately groomed. No acute distress obese BF SKIN: No diaphoresis rash HEENT: Unremarkable RESPIRATORY: Breathing is even, unlabored. Lung sounds are clear   CARDIOVASCULAR: Heart irreg no murmurs, rubs or gallops. trace peripheral edema  GASTROINTESTINAL: Abdomen is soft, non-tender, not distended w/ normal bowel sounds.  GENITOURINARY: Bladder non tender, not distended  MUSCULOSKELETAL: No abnormal joints or musculature NEUROLOGIC: Cranial nerves 2-12 grossly intact; some aphasia PSYCHIATRIC: Mood and affect appropriate to situation, no behavioral issues  Patient Active Problem List   Diagnosis Date Noted  . Allergic rhinitis 12/08/2013  . Atrial fibrillation 08/16/2013  . Anemia 08/16/2013   . Chronic pain syndrome 08/16/2013  . Altered mental status 01/28/2013  . Hypertension 01/28/2013  . Unspecified hereditary and idiopathic peripheral neuropathy 01/28/2013  . Depression 01/28/2013  . CVA (cerebral infarction) 11/25/2012  . Hypertension, accelerated 11/18/2012  . New Onset Atrial fibrillation with RVR 11/18/2012  . CKD (chronic kidney disease) stage 4, GFR 15-29 ml/min 11/18/2012  . Diabetes mellitus with renal manifestations, controlled 11/18/2012  . Headache 11/18/2012  . Acute cerebral infarction/Large acute left PCA infarct & Small acute right PCA cortical infarct involving the occipital 11/18/2012    CBC    Component Value Date/Time   WBC 5.8 06/24/2013 1430   RBC 5.03 06/24/2013 1430   HGB 14.8 06/24/2013 1430   HCT 43.0 06/24/2013 1430   PLT 339 06/24/2013 1430   MCV 85.5 06/24/2013 1430   LYMPHSABS 1.5 01/28/2013 1110   MONOABS 0.8 01/28/2013 1110   EOSABS 0.1 01/28/2013 1110   BASOSABS 0.0 01/28/2013 1110    CMP     Component Value Date/Time   NA 137 06/24/2013 1430   K 3.6 06/24/2013 1430   CL 98 06/24/2013 1430   CO2 30 06/24/2013 1430   GLUCOSE 175* 06/24/2013 1430   BUN 15 06/24/2013 1430   CREATININE 1.11* 06/24/2013 1430   CALCIUM 9.7 06/24/2013 1430   PROT 9.2* 06/24/2013 1430   ALBUMIN 3.1* 06/24/2013 1430   AST 22 06/24/2013 1430   ALT 28 06/24/2013 1430   ALKPHOS 100 06/24/2013 1430   BILITOT 0.4 06/24/2013 1430   GFRNONAA 51* 06/24/2013 1430   GFRAA 59* 06/24/2013 1430    Assessment and Plan  Diabetes mellitus with renal manifestations, controlled Tradgenta 5 mg to continue; no ACE or ARB 2/2 renal function  CKD (chronic kidney disease) stage 4, GFR 15-29 ml/min Between stage 3 and 4; GFR 32, CrCl 49  Atrial fibrillation Toprol XL 100 mg and cardizem LA 360 mg for rate control;xarelto 15 mg for prophylaxis  Hypertension, accelerated Clonidine o.2mg , Diltiazem 360, toprol XL 100 mg, cardura for BP[no change in  tx  Unspecified hereditary and idiopathic peripheral neuropathy Lyrica 50 mg TID to continue  Depression Celexa 20 mg now;to conti ue  Allergic rhinitis Controlled on zyrtec    Margit HanksAnne D Ming Mcmannis, MD

## 2013-12-08 NOTE — Assessment & Plan Note (Signed)
Celexa 20 mg now;to conti ue

## 2013-12-08 NOTE — Assessment & Plan Note (Signed)
Between stage 3 and 4; GFR 32, CrCl 49

## 2013-12-08 NOTE — Assessment & Plan Note (Signed)
Clonidine o.2mg , Diltiazem 360, toprol XL 100 mg, cardura for BP[no change in tx

## 2013-12-08 NOTE — Assessment & Plan Note (Signed)
Controlled on zyrtec 

## 2013-12-26 ENCOUNTER — Other Ambulatory Visit: Payer: Self-pay | Admitting: *Deleted

## 2013-12-26 MED ORDER — PREGABALIN 50 MG PO CAPS: 50.0000 mg | ORAL_CAPSULE | Freq: Three times a day (TID) | ORAL | Status: DC

## 2013-12-26 NOTE — Telephone Encounter (Signed)
[facility] 

## 2014-03-07 ENCOUNTER — Other Ambulatory Visit: Payer: Self-pay | Admitting: *Deleted

## 2014-03-07 MED ORDER — HYDROCODONE-ACETAMINOPHEN 5-325 MG PO TABS: ORAL_TABLET | ORAL | Status: DC

## 2014-03-07 NOTE — Telephone Encounter (Signed)
[facility] 

## 2014-03-21 ENCOUNTER — Other Ambulatory Visit: Payer: Self-pay | Admitting: *Deleted

## 2014-03-21 MED ORDER — PREGABALIN 50 MG PO CAPS: ORAL_CAPSULE | ORAL | Status: DC

## 2014-03-21 NOTE — Telephone Encounter (Signed)
[facility] 

## 2014-03-21 NOTE — Telephone Encounter (Signed)
[facility] 

## 2014-04-12 ENCOUNTER — Other Ambulatory Visit: Payer: Self-pay | Admitting: *Deleted

## 2014-04-12 MED ORDER — HYDROCODONE-ACETAMINOPHEN 5-325 MG PO TABS: ORAL_TABLET | ORAL | Status: DC

## 2014-04-12 NOTE — Telephone Encounter (Signed)
[facility] 

## 2014-05-15 ENCOUNTER — Non-Acute Institutional Stay (SKILLED_NURSING_FACILITY): Payer: PRIVATE HEALTH INSURANCE | Admitting: Internal Medicine

## 2014-05-15 DIAGNOSIS — D638 Anemia in other chronic diseases classified elsewhere: Secondary | ICD-10-CM

## 2014-05-15 DIAGNOSIS — F0150 Vascular dementia without behavioral disturbance: Secondary | ICD-10-CM

## 2014-05-15 DIAGNOSIS — I10 Essential (primary) hypertension: Secondary | ICD-10-CM

## 2014-05-15 DIAGNOSIS — F320 Major depressive disorder, single episode, mild: Secondary | ICD-10-CM

## 2014-05-15 DIAGNOSIS — I482 Chronic atrial fibrillation, unspecified: Secondary | ICD-10-CM

## 2014-05-15 DIAGNOSIS — I638 Other cerebral infarction: Secondary | ICD-10-CM

## 2014-05-15 DIAGNOSIS — I69920 Aphasia following unspecified cerebrovascular disease: Secondary | ICD-10-CM

## 2014-05-15 DIAGNOSIS — I639 Cerebral infarction, unspecified: Secondary | ICD-10-CM

## 2014-05-15 DIAGNOSIS — N184 Chronic kidney disease, stage 4 (severe): Secondary | ICD-10-CM

## 2014-05-15 NOTE — Progress Notes (Signed)
MRN: [id number] Name: [name]  Sex: female Age: 68 y.o. DOB: 1946-06-10  [facility] Facility/Room:210 Level Of Care: SNF Provider: Merrilee Seashore D Emergency Contacts: Extended Emergency Contact Information Primary Emergency Contact: [name] [place] Mobile Phone: [phone] Relation: Daughter Secondary Emergency Contact: [name],[name] Address: [address]          [place],  [place] Home Phone: [phone] Relation: Sister  Code Status: FULL  Allergies: Review of patient's allergies indicates no known allergies.  Chief Complaint  Patient presents with  . Medical Management of Chronic Issues    HPI: Patient is 68 y.o. female who is being seen for routine problems.  Past Medical History  Diagnosis Date  . Hypertension   . Diabetes mellitus   . Glaucoma   . Cataract   . Stroke   . Renal disorder   . Atrial fibrillation     History reviewed. No pertinent past surgical history.    Medication List       This list is accurate as of: 05/15/14 11:59 PM.  Always use your most recent med list.               acetaminophen 325 MG tablet  Commonly known as:  TYLENOL  Take 650 mg by mouth every 6 (six) hours as needed for mild pain.     citalopram 10 MG tablet  Commonly known as:  CELEXA  Take 20 mg by mouth daily.     cloNIDine 0.1 MG tablet  Commonly known as:  CATAPRES  Take 2 tablets (0.2 mg total) by mouth 2 (two) times daily.     docusate sodium 100 MG capsule  Commonly known as:  COLACE  Take 100 mg by mouth 2 (two) times daily.     donepezil 10 MG tablet  Commonly known as:  ARICEPT  Take 10 mg by mouth at bedtime.     glimepiride 1 MG tablet  Commonly known as:  AMARYL  Take 1 tablet (1 mg total) by mouth daily before breakfast.     HYDROcodone-acetaminophen 5-325 MG per tablet  Commonly known as:  NORCO/VICODIN  Take one tablet by mouth every 6 hours as needed for pain     pregabalin 50 MG capsule  Commonly known as:  LYRICA  Take one capsule by mouth three times daily for pains     Rivaroxaban 15 MG Tabs tablet  Commonly known as:  XARELTO  Take 1 tablet (15 mg total) by mouth daily with supper.     traMADol 50 MG tablet  Commonly known as:  ULTRAM  Take one tablet by mouth three times daily for pain. Hold for sedation        Meds ordered this encounter  Medications  . donepezil (ARICEPT) 10 MG tablet    Sig: Take 10 mg by mouth at bedtime.     There is no immunization history on file for this patient.  History  Substance Use Topics  . Smoking status: Never Smoker   . Smokeless tobacco: Not on file  . Alcohol Use: No    Review of Systems  DATA OBTAINED: from patient, nurse; no concerns GENERAL:  no fevers, fatigue, appetite changes SKIN: No itching, rash HEENT: No complaint RESPIRATORY: No cough, wheezing, SOB CARDIAC: No chest pain, palpitations, lower extremity edema  GI: No abdominal pain, No N/V/D or constipation, No heartburn or reflux  GU: No dysuria, frequency or urgency, or incontinence  MUSCULOSKELETAL: No unrelieved bone/joint  pain NEUROLOGIC: No headache, dizziness  PSYCHIATRIC: No overt anxiety or sadness  Filed Vitals:   05/15/14 1937  BP: 136/84  Pulse: 88  Temp: 97.9 F (36.6 C)  Resp: 20    Physical Exam  GENERAL APPEARANCE: Alert, modconversant, No acute distress  SKIN: No diaphoresis rash HEENT: Unremarkable RESPIRATORY: Breathing is even, unlabored. Lung sounds are clear   CARDIOVASCULAR: Heart irreg no murmurs, rubs or gallops. 1+ peripheral edema  GASTROINTESTINAL: Abdomen is soft, non-tender, not distended w/ normal bowel sounds.  GENITOURINARY: Bladder non tender, not distended  MUSCULOSKELETAL: No abnormal joints or musculature NEUROLOGIC: Cranial nerves 2-12 grossly intact. Moves all extremities, slt weaker on R, self propels in [facility] PSYCHIATRIC: Mood and affect appropriate to situation, no  behavioral issues  Patient Active Problem List   Diagnosis Date Noted  . 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 Vascular dementia without behavioral disturbance 05/20/2014  . Allergic rhinitis 12/08/2013  . Atrial fibrillation, chronic 08/16/2013  . Anemia of chronic disease 08/16/2013  . Chronic pain syndrome 08/16/2013  . Altered mental status 01/28/2013  . Essential hypertension 01/28/2013  . Unspecified hereditary and idiopathic peripheral neuropathy 01/28/2013  . Major depressive disorder, single episode, mild with melancholic features 01/28/2013  . CVA (cerebral infarction) 11/25/2012  . Hypertension, accelerated 11/18/2012  . New Onset Atrial fibrillation with RVR 11/18/2012  . CKD (chronic kidney disease) stage 4, GFR 15-29 ml/min 11/18/2012  . Diabetes mellitus with renal manifestations, controlled 11/18/2012  . Headache 11/18/2012  . Acute cerebral infarction/Large acute left PCA infarct & Small acute right PCA cortical infarct involving the occipital 11/18/2012    CBC    Component Value Date/Time   WBC 5.8 06/24/2013 1430   RBC 5.03 06/24/2013 1430   HGB 14.8 06/24/2013 1430   HCT 43.0 06/24/2013 1430   PLT 339 06/24/2013 1430   MCV 85.5 06/24/2013 1430   LYMPHSABS 1.5 01/28/2013 1110   MONOABS 0.8 01/28/2013 1110   EOSABS 0.1 01/28/2013 1110   BASOSABS 0.0 01/28/2013 1110    CMP     Component Value Date/Time   NA 137 06/24/2013 1430   K 3.6 06/24/2013 1430   CL 98 06/24/2013 1430   CO2 30 06/24/2013 1430   GLUCOSE 175* 06/24/2013 1430   BUN 15 06/24/2013 1430   CREATININE 1.11* 06/24/2013 1430   CALCIUM 9.7 06/24/2013 1430   PROT 9.2* 06/24/2013 1430   ALBUMIN 3.1* 06/24/2013 1430   AST 22 06/24/2013 1430   ALT 28 06/24/2013 1430   ALKPHOS 100 06/24/2013 1430   BILITOT 0.4 06/24/2013 1430   GFRNONAA 51* 06/24/2013 1430   GFRAA 59* 06/24/2013 1430    Assessment and Plan  Aphasia due to late effects of cerebrovascular  disease Chronic and stable with occ word finding noted but able to answer yes and no questions;will momitor and address new problems as needed  Acute cerebral infarction/Large acute left PCA infarct & Small acute right PCA cortical infarct involving the occipital With R side weakness but tingling sensation gone and no contractures;will monitor  Major depressive disorder, single episode, mild with melancholic features Being controlled on celexa 20 mg daily;no changes desired  Vascular dementia without behavioral disturbance Aricept being increased to 10 mg;pt continues on celexa 20 mg  Atrial fibrillation, chronic No change from prior,Toprol and cardizem for rate and xarelto for prophlaxis  Essential hypertension Cardura,toprol,diltiazem, clonidine to control  Anemia of chronic disease Normocyti cHb improved at 12.4 in 04/2014;FOBT neg X 3  CKD (chronic kidney disease) stage 4, GFR 15-29 ml/min BUN 30, Cr 1.72, CRCl 63-all stable/baseline    Margit Hanks, MD

## 2014-05-20 ENCOUNTER — Encounter: Payer: Self-pay | Admitting: Internal Medicine

## 2014-05-20 DIAGNOSIS — I69920 Aphasia following unspecified cerebrovascular disease: Secondary | ICD-10-CM | POA: Insufficient documentation

## 2014-05-20 DIAGNOSIS — F0150 Vascular dementia without behavioral disturbance: Secondary | ICD-10-CM | POA: Insufficient documentation

## 2014-05-20 NOTE — Assessment & Plan Note (Signed)
Chronic and stable with occ word finding noted but able to answer yes and no questions;will momitor and address new problems as needed

## 2014-05-20 NOTE — Assessment & Plan Note (Signed)
BUN 30, Cr 1.72, CRCl 63-all stable/baseline

## 2014-05-20 NOTE — Assessment & Plan Note (Signed)
Aricept being increased to 10 mg;pt continues on celexa 20 mg

## 2014-05-20 NOTE — Assessment & Plan Note (Addendum)
Normocyti cHb improved at 12.4 in 04/2014;FOBT neg X 3

## 2014-05-20 NOTE — Assessment & Plan Note (Signed)
Cardura,toprol,diltiazem, clonidine to control

## 2014-05-20 NOTE — Assessment & Plan Note (Signed)
With R side weakness but tingling sensation gone and no contractures;will monitor

## 2014-05-20 NOTE — Assessment & Plan Note (Signed)
Being controlled on celexa 20 mg daily;no changes desired

## 2014-05-20 NOTE — Assessment & Plan Note (Signed)
No change from prior,Toprol and cardizem for rate and xarelto for prophlaxis

## 2014-05-26 ENCOUNTER — Other Ambulatory Visit: Payer: Self-pay

## 2014-06-16 ENCOUNTER — Other Ambulatory Visit: Payer: Self-pay | Admitting: *Deleted

## 2014-06-16 MED ORDER — TRAMADOL HCL 50 MG PO TABS: ORAL_TABLET | ORAL | Status: DC

## 2014-06-16 NOTE — Telephone Encounter (Signed)
[facility] 

## 2014-08-07 ENCOUNTER — Other Ambulatory Visit: Payer: Self-pay | Admitting: *Deleted

## 2014-08-07 MED ORDER — HYDROCODONE-ACETAMINOPHEN 5-325 MG PO TABS: ORAL_TABLET | ORAL | Status: DC

## 2014-08-08 ENCOUNTER — Other Ambulatory Visit: Payer: Self-pay | Admitting: *Deleted

## 2014-08-08 MED ORDER — HYDROCODONE-ACETAMINOPHEN 5-325 MG PO TABS: ORAL_TABLET | ORAL | Status: DC

## 2014-08-08 NOTE — Telephone Encounter (Signed)
[facility] 

## 2014-08-21 ENCOUNTER — Non-Acute Institutional Stay (SKILLED_NURSING_FACILITY): Payer: Medicare Other | Admitting: Internal Medicine

## 2014-08-21 DIAGNOSIS — I10 Essential (primary) hypertension: Secondary | ICD-10-CM

## 2014-08-21 DIAGNOSIS — K219 Gastro-esophageal reflux disease without esophagitis: Secondary | ICD-10-CM

## 2014-08-21 DIAGNOSIS — N184 Chronic kidney disease, stage 4 (severe): Secondary | ICD-10-CM

## 2014-08-21 DIAGNOSIS — R001 Bradycardia, unspecified: Secondary | ICD-10-CM

## 2014-08-21 DIAGNOSIS — E1121 Type 2 diabetes mellitus with diabetic nephropathy: Secondary | ICD-10-CM

## 2014-08-21 DIAGNOSIS — E1129 Type 2 diabetes mellitus with other diabetic kidney complication: Secondary | ICD-10-CM

## 2014-08-21 DIAGNOSIS — J309 Allergic rhinitis, unspecified: Secondary | ICD-10-CM

## 2014-08-21 NOTE — Progress Notes (Signed)
MRN: [id number] Name: [name]  Sex: female Age: 69 y.o. DOB: 03/01/1946  [facility] Facility[facility]:  Level Of Care: SNF Provider: Merrilee SeashoreALEXANDER, Trude Cansler D Emergency Contacts: Extended Emergency Contact Information Primary Emergency Contact: Rosanne AshingMcLean,Ivy  [place] Mobile Phone: [phone] Relation: Daughter Secondary Emergency Contact: [name],[name] Address: [address]          [place], [place] Home Phone: [phone] Relation: Sister  Code Status: FULL  Allergies: Review of patient's allergies indicates no known allergies.  Chief Complaint  Patient presents with  . Medical Management of Chronic Issues    HPI: Patient is 69 y.o. female who is being seen for routine issues.  Past Medical History  Diagnosis Date  . Hypertension   . Diabetes mellitus   . Glaucoma   . Cataract   . Stroke   . Renal disorder   . Atrial fibrillation     History reviewed. No pertinent past surgical history.    Medication List       This list is accurate as of: 08/21/14 11:59 PM.  Always use your most recent med list.               acetaminophen 325 MG tablet  Commonly known as:  TYLENOL  Take 650 mg by mouth every 6 (six) hours as needed for mild pain.     citalopram 10 MG tablet  Commonly known as:  CELEXA  Take 20 mg by mouth daily.     cloNIDine 0.1 MG tablet  Commonly known as:  CATAPRES  Take 2 tablets (0.2 mg total) by mouth 2 (two) times daily.     docusate sodium 100 MG capsule  Commonly known as:  COLACE  Take 100 mg by mouth 2 (two) times daily.     donepezil 10 MG tablet  Commonly known as:  ARICEPT  Take 10 mg by mouth at bedtime.     HYDROcodone-acetaminophen 5-325 MG per tablet  Commonly known as:  NORCO/VICODIN  Take one tablet by mouth every 6 hours as needed for pain     linagliptin 5 MG Tabs tablet  Commonly known as:  TRADJENTA  Take 5 mg by mouth daily.     metoprolol succinate 50 MG 24 hr  tablet  Commonly known as:  TOPROL-XL  Take 50 mg by mouth daily. Take with or immediately following a meal.     omeprazole 20 MG capsule  Commonly known as:  PRILOSEC  Take 20 mg by mouth daily.     pregabalin 50 MG capsule  Commonly known as:  LYRICA  Take one capsule by mouth three times daily for pains     Rivaroxaban 15 MG Tabs tablet  Commonly known as:  XARELTO  Take 1 tablet (15 mg total) by mouth daily with supper.     traMADol 50 MG tablet  Commonly known as:  ULTRAM  Take one tablet by mouth three times daily for pain. Hold for sedation        Meds ordered this encounter  Medications  . linagliptin (TRADJENTA) 5 MG TABS tablet    Sig: Take 5 mg by mouth daily.  . metoprolol succinate (TOPROL-XL) 50 MG 24 hr tablet    Sig: Take 50 mg by mouth daily. Take with or immediately following a meal.  . omeprazole (PRILOSEC) 20 MG capsule    Sig: Take 20 mg by mouth daily.     There is no immunization history on file for this  patient.  History  Substance Use Topics  . Smoking status: Never Smoker   . Smokeless tobacco: Not on file  . Alcohol Use: No    Family history is noncontributory    Review of Systems  DATA OBTAINED: from patient, nurse, medical record GENERAL:  no fevers, fatigue, appetite changes SKIN: No itching, rash or wounds EYES: No eye pain, redness, discharge EARS: No earache, tinnitus, change in hearing NOSE: No congestion, drainage or bleeding  MOUTH/THROAT: No mouth or tooth pain, No sore throat RESPIRATORY: No cough, wheezing, SOB CARDIAC: No chest pain, palpitations, lower extremity edema  GI: No abdominal pain, No N/V/D or constipation, No heartburn or reflux  GU: No dysuria, frequency or urgency, or incontinence  MUSCULOSKELETAL: No unrelieved bone/joint pain NEUROLOGIC: No headache, dizziness or focal weakness PSYCHIATRIC: No overt anxiety or sadness, No behavior issue.   Filed Vitals:   08/21/14 2123  BP: 129/70  Pulse: 75   Temp: 98.3 F (36.8 C)  Resp: 18    Physical Exam  GENERAL APPEARANCE: Alert, conversant,  No acute distress.  SKIN: No diaphoresis rash HEAD: Normocephalic, atraumatic  EYES: Conjunctiva/lids clear. Pupils round, reactive. EOMs intact.  EARS: External exam WNL, canals clear. Hearing grossly normal.  NOSE: No deformity or discharge.  MOUTH/THROAT: Lips w/o lesions  RESPIRATORY: Breathing is even, unlabored. Lung sounds are clear   CARDIOVASCULAR: Heart irreg no murmurs, rubs or gallops. 1+ peripheral edema.   GASTROINTESTINAL: Abdomen is soft, non-tender, not distended w/ normal bowel sounds. GENITOURINARY: Bladder non tender, not distended  MUSCULOSKELETAL: No abnormal joints or musculature NEUROLOGIC:  Cranial nerves 2-12 grossly intact  PSYCHIATRIC: Mood and affect appropriate to situation, no behavioral issues  Patient Active Problem List   Diagnosis Date Noted  . Bradycardia 08/27/2014  . GERD (gastroesophageal reflux disease) 08/27/2014  . 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 Vascular dementia without behavioral disturbance 05/20/2014  . Allergic rhinitis 12/08/2013  . Atrial fibrillation, chronic 08/16/2013  . Anemia of chronic disease 08/16/2013  . Chronic pain syndrome 08/16/2013  . Altered mental status 01/28/2013  . Essential hypertension 01/28/2013  . Unspecified hereditary and idiopathic peripheral neuropathy 01/28/2013  . Major depressive disorder, single episode, mild with melancholic features 01/28/2013  . CVA (cerebral infarction) 11/25/2012  . Hypertension, accelerated 11/18/2012  . New Onset Atrial fibrillation with RVR 11/18/2012  . CKD (chronic kidney disease) stage 4, GFR 15-29 ml/min 11/18/2012  . Diabetes mellitus with renal manifestations, controlled 11/18/2012  . Headache 11/18/2012  . Acute cerebral infarction/Large acute left PCA infarct & Small acute right PCA cortical infarct involving the occipital 11/18/2012       Assessment and Plan  Bradycardia New in December;pt on bblocker ToprolXL which was dec from 100 mg to 50 mg. Lisinopril 10 mg was started for additional BP control   Essential hypertension Pt on a potpouri of BP meds;of note toprol XL was decreased from 100 mg to 50 mg and Lisinopril 10 mg was added.   Diabetes mellitus with renal manifestations, controlled A1c 6.8 in 06/2014; pt now on lisinopril for BP;plan continue trajenta 5 mg daily and monitor kidney function   Allergic rhinitis Pt has been stable on zyrtec;plan continue zyrtec 10 mg daily   GERD (gastroesophageal reflux disease) Stable without sx on prilosec 20 mg;plan continue prilosec 20 mg   CKD (chronic kidney disease) stage 4, GFR 15-29 ml/min GFR - 48, CrCl-67 in 06/2014, stable;plan-now on ACE will continue to monitor  Hennie Duos, MD

## 2014-08-27 ENCOUNTER — Encounter: Payer: Self-pay | Admitting: Internal Medicine

## 2014-08-27 DIAGNOSIS — R001 Bradycardia, unspecified: Secondary | ICD-10-CM | POA: Insufficient documentation

## 2014-08-27 DIAGNOSIS — K219 Gastro-esophageal reflux disease without esophagitis: Secondary | ICD-10-CM | POA: Insufficient documentation

## 2014-08-27 NOTE — Assessment & Plan Note (Signed)
New in December;pt on bblocker ToprolXL which was dec from 100 mg to 50 mg. Lisinopril 10 mg was started for additional BP control

## 2014-08-27 NOTE — Assessment & Plan Note (Signed)
Pt has been stable on zyrtec;plan continue zyrtec 10 mg daily

## 2014-08-27 NOTE — Assessment & Plan Note (Signed)
GFR - 48, CrCl-67 in 06/2014, stable;plan-now on ACE will continue to monitor

## 2014-08-27 NOTE — Assessment & Plan Note (Signed)
A1c 6.8 in 06/2014; pt now on lisinopril for BP;plan continue trajenta 5 mg daily and monitor kidney function

## 2014-08-27 NOTE — Assessment & Plan Note (Signed)
Pt on a potpouri of BP meds;of note toprol XL was decreased from 100 mg to 50 mg and Lisinopril 10 mg was added.

## 2014-08-27 NOTE — Assessment & Plan Note (Signed)
Stable without sx on prilosec 20 mg;plan continue prilosec 20 mg

## 2014-09-27 ENCOUNTER — Other Ambulatory Visit: Payer: Self-pay

## 2014-09-27 LAB — HM DIABETES EYE EXAM

## 2014-09-27 MED ORDER — HYDROCODONE-ACETAMINOPHEN 5-325 MG PO TABS: ORAL_TABLET | ORAL | Status: DC

## 2014-09-27 MED ORDER — HYDROCODONE-ACETAMINOPHEN 5-325 MG PO TABS
ORAL_TABLET | ORAL | Status: DC
Start: 2014-09-27 — End: 2014-09-27

## 2014-09-27 NOTE — Telephone Encounter (Signed)
Faxed to [facility] Fax Number: [phone], Phone Number [phone]  

## 2014-09-28 ENCOUNTER — Encounter: Payer: Self-pay | Admitting: Internal Medicine

## 2014-09-28 ENCOUNTER — Non-Acute Institutional Stay (SKILLED_NURSING_FACILITY): Payer: Medicare Other | Admitting: Internal Medicine

## 2014-09-28 DIAGNOSIS — I482 Chronic atrial fibrillation, unspecified: Secondary | ICD-10-CM

## 2014-09-28 DIAGNOSIS — F0150 Vascular dementia without behavioral disturbance: Secondary | ICD-10-CM

## 2014-09-28 DIAGNOSIS — F320 Major depressive disorder, single episode, mild: Secondary | ICD-10-CM

## 2014-09-28 DIAGNOSIS — D638 Anemia in other chronic diseases classified elsewhere: Secondary | ICD-10-CM

## 2014-09-28 NOTE — Assessment & Plan Note (Addendum)
Chronic and stable with toprol for rate control and xarelto as prophylaxis

## 2014-09-28 NOTE — Progress Notes (Signed)
MRN: [id number] Name: [name]  Sex: female Age: 69 y.o. DOB: 01-May-1946  [facility] Facility/Room:201A Level Of Care: SNF Provider: Merrilee Seashore D Emergency Contacts: Extended Emergency Contact Information Primary Emergency Contact: [name] [place] Mobile Phone: [phone] Relation: Daughter Secondary Emergency Contact: [name],[name] Address: [address]          [place], [place] Home Phone: [phone] Relation: Sister  Code Status: FULL  Allergies: Review of patient's allergies indicates no known allergies.  Chief Complaint  Patient presents with  . Medical Management of Chronic Issues    HPI: Patient is 69 y.o. female who is being seen for routine issues.  Past Medical History  Diagnosis Date  . Hypertension   . Diabetes mellitus   . Glaucoma   . Cataract   . Stroke   . Renal disorder   . Atrial fibrillation     History reviewed. No pertinent past surgical history.    Medication List       This list is accurate as of: 09/28/14  7:55 PM.  Always use your most recent med list.               acetaminophen 325 MG tablet  Commonly known as:  TYLENOL  Take 650 mg by mouth every 6 (six) hours as needed for mild pain.     citalopram 10 MG tablet  Commonly known as:  CELEXA  Take 20 mg by mouth daily.     cloNIDine 0.1 MG tablet  Commonly known as:  CATAPRES  Take 2 tablets (0.2 mg total) by mouth 2 (two) times daily.     docusate sodium 100 MG capsule  Commonly known as:  COLACE  Take 100 mg by mouth 2 (two) times daily.     donepezil 10 MG tablet  Commonly known as:  ARICEPT  Take 10 mg by mouth at bedtime.     HYDROcodone-acetaminophen 5-325 MG per tablet  Commonly known as:  NORCO/VICODIN  Take two tablet by mouth every 6 hours as needed for pain     linagliptin 5 MG Tabs tablet  Commonly known as:  TRADJENTA  Take 5 mg by mouth daily.     metoprolol succinate 50 MG 24 hr  tablet  Commonly known as:  TOPROL-XL  Take 50 mg by mouth daily. Take with or immediately following a meal.     omeprazole 20 MG capsule  Commonly known as:  PRILOSEC  Take 20 mg by mouth daily.     pregabalin 50 MG capsule  Commonly known as:  LYRICA  Take one capsule by mouth three times daily for pains     Rivaroxaban 15 MG Tabs tablet  Commonly known as:  XARELTO  Take 1 tablet (15 mg total) by mouth daily with supper.     traMADol 50 MG tablet  Commonly known as:  ULTRAM  Take one tablet by mouth three times daily for pain. Hold for sedation        No orders of the defined types were placed in this encounter.     There is no immunization history on file for this patient.  History  Substance Use Topics  . Smoking status: Never Smoker   . Smokeless tobacco: Not on file  . Alcohol Use: No    Review of Systems  DATA OBTAINED: from patient, nurse GENERAL:  no fevers, fatigue, appetite changes SKIN: No itching, rash HEENT: No complaint RESPIRATORY: No cough, wheezing, SOB  CARDIAC: No chest pain, palpitations, lower extremity edema  GI: No abdominal pain, No N/V/D or constipation, No heartburn or reflux  GU: No dysuria, frequency or urgency, or incontinence  MUSCULOSKELETAL: No unrelieved bone/joint pain NEUROLOGIC: No headache, dizziness  PSYCHIATRIC: No overt anxiety or sadness  Filed Vitals:   09/28/14 1947  BP: 124/55  Pulse: 60  Temp: 99.4 F (37.4 C)  Resp: 18    Physical Exam  GENERAL APPEARANCE: Alert, conversant, No acute distress  SKIN: No diaphoresis rash HEENT: Unremarkable RESPIRATORY: Breathing is even, unlabored. Lung sounds are clear   CARDIOVASCULAR: Heart RRR no murmurs, rubs or gallops. 1+ peripheral edema  GASTROINTESTINAL: Abdomen is soft, non-tender, not distended w/ normal bowel sounds.  GENITOURINARY: Bladder non tender, not distended  MUSCULOSKELETAL: No abnormal joints or musculature NEUROLOGIC: Cranial nerves 2-12 grossly  intact; yes/no answers/aphasia PSYCHIATRIC: confusion, no behavioral issues  Patient Active Problem List   Diagnosis Date Noted  . Bradycardia 08/27/2014  . GERD (gastroesophageal reflux disease) 08/27/2014  . 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 Vascular dementia without behavioral disturbance 05/20/2014  . Allergic rhinitis 12/08/2013  . Atrial fibrillation, chronic 08/16/2013  . Anemia of chronic disease 08/16/2013  . Chronic pain syndrome 08/16/2013  . Altered mental status 01/28/2013  . Essential hypertension 01/28/2013  . Unspecified hereditary and idiopathic peripheral neuropathy 01/28/2013  . Major depressive disorder, single episode, mild with melancholic features 01/28/2013  . CVA (cerebral infarction) 11/25/2012  . Hypertension, accelerated 11/18/2012  . New Onset Atrial fibrillation with RVR 11/18/2012  . CKD (chronic kidney disease) stage 4, GFR 15-29 ml/min 11/18/2012  . Diabetes mellitus with renal manifestations, controlled 11/18/2012  . Headache 11/18/2012  . Acute cerebral infarction/Large acute left PCA infarct & Small acute right PCA cortical infarct involving the occipital 11/18/2012    CBC    Component Value Date/Time   WBC 5.8 06/24/2013 1430   RBC 5.03 06/24/2013 1430   HGB 14.8 06/24/2013 1430   HCT 43.0 06/24/2013 1430   PLT 339 06/24/2013 1430   MCV 85.5 06/24/2013 1430   LYMPHSABS 1.5 01/28/2013 1110   MONOABS 0.8 01/28/2013 1110   EOSABS 0.1 01/28/2013 1110   BASOSABS 0.0 01/28/2013 1110    CMP     Component Value Date/Time   NA 137 06/24/2013 1430   K 3.6 06/24/2013 1430   CL 98 06/24/2013 1430   CO2 30 06/24/2013 1430   GLUCOSE 175* 06/24/2013 1430   BUN 15 06/24/2013 1430   CREATININE 1.11* 06/24/2013 1430   CALCIUM 9.7 06/24/2013 1430   PROT 9.2* 06/24/2013 1430   ALBUMIN 3.1* 06/24/2013 1430   AST 22 06/24/2013 1430   ALT 28 06/24/2013 1430   ALKPHOS 100 06/24/2013 1430   BILITOT 0.4 06/24/2013  1430   GFRNONAA 51* 06/24/2013 1430   GFRAA 59* 06/24/2013 1430    Assessment and Plan  Atrial fibrillation, chronic Chronic and stable with toprol for rate control and xarelto as prophylaxis   Vascular dementia without behavioral disturbance Chronic and stable without major decine;pt tolerating Aricept;plan-continue aricept   Major depressive disorder, single episode, mild with melancholic features Stable, no tearful episodes, denies sadness;Continue celexa 20 mg   Anemia of chronic disease Hb 12.4/Hct 37.1 in 09/2014; very stable     [name], Aurelie Dicenzo D, MD

## 2014-09-28 NOTE — Assessment & Plan Note (Signed)
Stable, no tearful episodes, denies sadness;Continue celexa 20 mg

## 2014-09-28 NOTE — Assessment & Plan Note (Signed)
Chronic and stable without major decine;pt tolerating Aricept;plan-continue aricept

## 2014-09-28 NOTE — Assessment & Plan Note (Signed)
Hb 12.4/Hct 37.1 in 09/2014; very stable

## 2014-10-19 ENCOUNTER — Non-Acute Institutional Stay (SKILLED_NURSING_FACILITY): Payer: Medicare Other | Admitting: Internal Medicine

## 2014-10-19 DIAGNOSIS — H2510 Age-related nuclear cataract, unspecified eye: Secondary | ICD-10-CM | POA: Diagnosis not present

## 2014-10-19 DIAGNOSIS — K219 Gastro-esophageal reflux disease without esophagitis: Secondary | ICD-10-CM

## 2014-10-19 DIAGNOSIS — J3089 Other allergic rhinitis: Secondary | ICD-10-CM

## 2014-10-19 DIAGNOSIS — E113299 Type 2 diabetes mellitus with mild nonproliferative diabetic retinopathy without macular edema, unspecified eye: Secondary | ICD-10-CM

## 2014-10-19 DIAGNOSIS — N184 Chronic kidney disease, stage 4 (severe): Secondary | ICD-10-CM

## 2014-10-19 DIAGNOSIS — H4011X0 Primary open-angle glaucoma, stage unspecified: Secondary | ICD-10-CM | POA: Diagnosis not present

## 2014-10-19 DIAGNOSIS — E11329 Type 2 diabetes mellitus with mild nonproliferative diabetic retinopathy without macular edema: Secondary | ICD-10-CM

## 2014-10-19 NOTE — Progress Notes (Signed)
MRN: [id number] Name: [name]  Sex: female Age: 69 y.o. DOB: 09/22/1945  [facility] Facility/Room:201A Level Of Care: SNF Provider: Merrilee SeashoreALEXANDER, Amiyah Shryock D Emergency Contacts: Extended Emergency Contact Information Primary Emergency Contact: Rosanne AshingMcLean,Ivy  [place] Mobile Phone: [phone] Relation: Daughter Secondary Emergency Contact: [name],[name] Address: [address]          [place] Home Phone: [phone] Relation: Sister  Code Status: FULL  Allergies: Review of patient's allergies indicates no known allergies.  Chief Complaint  Patient presents with  . Medical Management of Chronic Issues    HPI: Patient is 69 y.o. female who is being seen for routine issues  Past Medical History  Diagnosis Date  . Hypertension   . Diabetes mellitus   . Glaucoma   . Cataract   . Stroke   . Renal disorder   . Atrial fibrillation     History reviewed. No pertinent past surgical history.    Medication List       This list is accurate as of: 10/19/14 11:59 PM.  Always use your most recent med list.               acetaminophen 325 MG tablet  Commonly known as:  TYLENOL  Take 650 mg by mouth every 6 (six) hours as needed for mild pain.     citalopram 10 MG tablet  Commonly known as:  CELEXA  Take 20 mg by mouth daily.     cloNIDine 0.1 MG tablet  Commonly known as:  CATAPRES  Take 2 tablets (0.2 mg total) by mouth 2 (two) times daily.     docusate sodium 100 MG capsule  Commonly known as:  COLACE  Take 100 mg by mouth 2 (two) times daily.     donepezil 10 MG tablet  Commonly known as:  ARICEPT  Take 10 mg by mouth at bedtime.     HYDROcodone-acetaminophen 5-325 MG per tablet  Commonly known as:  NORCO/VICODIN  Take two tablet by mouth every 6 hours as needed for pain     linagliptin 5 MG Tabs tablet  Commonly known as:  TRADJENTA  Take 5 mg by mouth daily.     metoprolol succinate 50 MG 24 hr  tablet  Commonly known as:  TOPROL-XL  Take 50 mg by mouth daily. Take with or immediately following a meal.     omeprazole 20 MG capsule  Commonly known as:  PRILOSEC  Take 20 mg by mouth daily.     pregabalin 50 MG capsule  Commonly known as:  LYRICA  Take one capsule by mouth three times daily for pains     Rivaroxaban 15 MG Tabs tablet  Commonly known as:  XARELTO  Take 1 tablet (15 mg total) by mouth daily with supper.     traMADol 50 MG tablet  Commonly known as:  ULTRAM  Take one tablet by mouth three times daily for pain. Hold for sedation        No orders of the defined types were placed in this encounter.     There is no immunization history on file for this patient.  History  Substance Use Topics  . Smoking status: Never Smoker   . Smokeless tobacco: Not on file  . Alcohol Use: No    Review of Systems  DATA OBTAINED: from patient, nurse, medical recor GENERAL:  no fevers, fatigue, appetite changes SKIN: No itching, rash HEENT: No complaint RESPIRATORY: No cough, wheezing,  SOB CARDIAC: No chest pain, palpitations, lower extremity edema  GI: No abdominal pain, No N/V/D or constipation, No heartburn or reflux  GU: No dysuria, frequency or urgency, or incontinence  MUSCULOSKELETAL: No unrelieved bone/joint pain NEUROLOGIC: No headache, dizziness  PSYCHIATRIC: No overt anxiety or sadness  Filed Vitals:   10/19/14 1509  BP: 124/59  Pulse: 60  Temp: 99.4 F (37.4 C)  Resp: 18    Physical Exam  GENERAL APPEARANCE: Alert, conversant, No acute distress  SKIN: No diaphoresis rash HEENT: Unremarkable RESPIRATORY: Breathing is even, unlabored. Lung sounds are clear   CARDIOVASCULAR: Heart RRR no murmurs, rubs or gallops.1+ peripheral edema  GASTROINTESTINAL: Abdomen is soft, non-tender, not distended w/ normal bowel sounds.  GENITOURINARY: Bladder non tender, not distended  MUSCULOSKELETAL: No abnormal joints or musculature NEUROLOGIC: Cranial nerves  2-12 grossly intact PSYCHIATRIC: Mood and affect appropriate to situation, no behavioral issues  Patient Active Problem List   Diagnosis Date Noted  . Nonproliferative diabetic retinopathy associated with type 2 diabetes mellitus 10/22/2014  . Primary open angle glaucoma of both eyes 10/22/2014  . Nuclear senile cataract 10/22/2014  . Bradycardia 08/27/2014  . GERD (gastroesophageal reflux disease) 08/27/2014  . 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 Vascular dementia without behavioral disturbance 05/20/2014  . Allergic rhinitis 12/08/2013  . Atrial fibrillation, chronic 08/16/2013  . Anemia of chronic disease 08/16/2013  . Chronic pain syndrome 08/16/2013  . Altered mental status 01/28/2013  . Essential hypertension 01/28/2013  . Unspecified hereditary and idiopathic peripheral neuropathy 01/28/2013  . Major depressive disorder, single episode, mild with melancholic features 01/28/2013  . CVA (cerebral infarction) 11/25/2012  . Hypertension, accelerated 11/18/2012  . New Onset Atrial fibrillation with RVR 11/18/2012  . CKD (chronic kidney disease) stage 4, GFR 15-29 ml/min 11/18/2012  . Diabetes mellitus with renal manifestations, controlled 11/18/2012  . Headache 11/18/2012  . Acute cerebral infarction/Large acute left PCA infarct & Small acute right PCA cortical infarct involving the occipital 11/18/2012    CBC    Component Value Date/Time   WBC 5.8 06/24/2013 1430   RBC 5.03 06/24/2013 1430   HGB 14.8 06/24/2013 1430   HCT 43.0 06/24/2013 1430   PLT 339 06/24/2013 1430   MCV 85.5 06/24/2013 1430   LYMPHSABS 1.5 01/28/2013 1110   MONOABS 0.8 01/28/2013 1110   EOSABS 0.1 01/28/2013 1110   BASOSABS 0.0 01/28/2013 1110    CMP     Component Value Date/Time   NA 137 06/24/2013 1430   K 3.6 06/24/2013 1430   CL 98 06/24/2013 1430   CO2 30 06/24/2013 1430   GLUCOSE 175* 06/24/2013 1430   BUN 15 06/24/2013 1430   CREATININE 1.11*  06/24/2013 1430   CALCIUM 9.7 06/24/2013 1430   PROT 9.2* 06/24/2013 1430   ALBUMIN 3.1* 06/24/2013 1430   AST 22 06/24/2013 1430   ALT 28 06/24/2013 1430   ALKPHOS 100 06/24/2013 1430   BILITOT 0.4 06/24/2013 1430   GFRNONAA 51* 06/24/2013 1430   GFRAA 59* 06/24/2013 1430    Assessment and Plan  Allergic rhinitis Without recent c/o sx;Plan -continue Zyrtec 10 mg daily   Nonproliferative diabetic retinopathy associated with type 2 diabetes mellitus Chronic and stable per ophthalmology visit 09/27/14; no visual changed=s noted;Plan - RTC 1 year   Primary open angle glaucoma of both eyes Chronic and stable per eye doctor 09/27/2014;Plan - monitor for changes and f/u with ophthalmology yearly   Nuclear senile cataract Nuclear sclerosis chronic and stable  per ophthalmology;Plan - f/u visit 1 year   CKD (chronic kidney disease) stage 4, GFR 15-29 ml/min Improved;GFR -41,CrCl -60 in 09/2014;Plan - continue lisinopril 10 mg   GERD (gastroesophageal reflux disease) Denies any sx of reflux;Plan - continue prilosec 20 mg     Margit Hanks, MD

## 2014-10-22 ENCOUNTER — Encounter: Payer: Self-pay | Admitting: Internal Medicine

## 2014-10-22 DIAGNOSIS — H401130 Primary open-angle glaucoma, bilateral, stage unspecified: Secondary | ICD-10-CM | POA: Insufficient documentation

## 2014-10-22 DIAGNOSIS — H2510 Age-related nuclear cataract, unspecified eye: Secondary | ICD-10-CM | POA: Insufficient documentation

## 2014-10-22 DIAGNOSIS — E113299 Type 2 diabetes mellitus with mild nonproliferative diabetic retinopathy without macular edema, unspecified eye: Secondary | ICD-10-CM | POA: Insufficient documentation

## 2014-10-22 NOTE — Assessment & Plan Note (Signed)
Chronic and stable per ophthalmology visit 09/27/14; no visual changed=s noted;Plan - RTC 1 year

## 2014-10-22 NOTE — Assessment & Plan Note (Signed)
Chronic and stable per eye doctor 09/27/2014;Plan - monitor for changes and f/u with ophthalmology yearly

## 2014-10-22 NOTE — Assessment & Plan Note (Signed)
Nuclear sclerosis chronic and stable per ophthalmology;Plan - f/u visit 1 year

## 2014-10-22 NOTE — Assessment & Plan Note (Signed)
Without recent c/o sx;Plan -continue Zyrtec 10 mg daily

## 2014-10-22 NOTE — Assessment & Plan Note (Signed)
Improved;GFR -41,CrCl -60 in 09/2014;Plan - continue lisinopril 10 mg

## 2014-10-22 NOTE — Assessment & Plan Note (Signed)
Denies any sx of reflux;Plan - continue prilosec 20 mg

## 2014-11-08 ENCOUNTER — Other Ambulatory Visit: Payer: Self-pay

## 2014-11-08 MED ORDER — TRAMADOL HCL 50 MG PO TABS: ORAL_TABLET | ORAL | Status: DC

## 2014-11-08 NOTE — Telephone Encounter (Signed)
Faxed to [facility] Fax Number: [phone], Phone Number [phone]  

## 2014-11-17 ENCOUNTER — Other Ambulatory Visit: Payer: Self-pay | Admitting: *Deleted

## 2014-11-17 MED ORDER — HYDROCODONE-ACETAMINOPHEN 5-325 MG PO TABS: ORAL_TABLET | ORAL | Status: DC

## 2014-11-17 NOTE — Telephone Encounter (Signed)
[facility] 

## 2014-12-21 ENCOUNTER — Other Ambulatory Visit: Payer: Self-pay | Admitting: *Deleted

## 2014-12-21 MED ORDER — TRAMADOL HCL 50 MG PO TABS: ORAL_TABLET | ORAL | Status: DC

## 2014-12-21 NOTE — Telephone Encounter (Signed)
[facility] 

## 2014-12-22 ENCOUNTER — Other Ambulatory Visit: Payer: Self-pay | Admitting: *Deleted

## 2014-12-22 MED ORDER — TRAMADOL HCL 50 MG PO TABS
ORAL_TABLET | ORAL | Status: DC
Start: 2014-12-22 — End: 2015-07-09

## 2014-12-22 NOTE — Telephone Encounter (Signed)
[facility] 

## 2015-01-01 ENCOUNTER — Encounter: Payer: Self-pay | Admitting: Internal Medicine

## 2015-01-01 ENCOUNTER — Non-Acute Institutional Stay (SKILLED_NURSING_FACILITY): Payer: Medicare Other | Admitting: Internal Medicine

## 2015-01-01 DIAGNOSIS — I482 Chronic atrial fibrillation, unspecified: Secondary | ICD-10-CM

## 2015-01-01 DIAGNOSIS — I1310 Hypertensive heart and chronic kidney disease without heart failure, with stage 1 through stage 4 chronic kidney disease, or unspecified chronic kidney disease: Secondary | ICD-10-CM

## 2015-01-01 DIAGNOSIS — R001 Bradycardia, unspecified: Secondary | ICD-10-CM | POA: Diagnosis not present

## 2015-01-01 DIAGNOSIS — N184 Chronic kidney disease, stage 4 (severe): Secondary | ICD-10-CM | POA: Diagnosis not present

## 2015-01-01 DIAGNOSIS — N183 Chronic kidney disease, stage 3 unspecified: Secondary | ICD-10-CM

## 2015-01-01 NOTE — Assessment & Plan Note (Signed)
Chronic and stable despite  changes in meds 2/2 bradycardia; now on cardizem 180 mg and xarelto as prophylaxis

## 2015-01-01 NOTE — Assessment & Plan Note (Signed)
NEW problem since last visit; In April metoprolol d/c, cardiazemCD dec from 240 to 180; plan -cont monitor and cont titrate meds as needed

## 2015-01-01 NOTE — Progress Notes (Signed)
MRN: [id number] Name: [name]  Sex: female Age: 69 y.o. DOB: 1945/08/31  [facility] Facility/Room: 201A Level Of Care: SNF Provider: Merrilee Seashore D Emergency Contacts: Extended Emergency Contact Information Primary Emergency Contact: [name] [place] Mobile Phone: [phone] Relation: Daughter Secondary Emergency Contact: [name],[name] Address: [address]          [place], [place] Home Phone: [phone] Relation: Sister  Code Status:FULL   Allergies: Review of patient's allergies indicates no known allergies.  Chief Complaint  Patient presents with  . Medical Management of Chronic Issues    HPI: Patient is 69 y.o. female who is being seen for routine issues.  Past Medical History  Diagnosis Date  . Hypertension   . Diabetes mellitus   . Glaucoma   . Cataract   . Stroke   . Renal disorder   . Atrial fibrillation     History reviewed. No pertinent past surgical history.    Medication List       This list is accurate as of: 01/01/15 11:14 AM.  Always use your most recent med list.               acetaminophen 325 MG tablet  Commonly known as:  TYLENOL  Take 650 mg by mouth every 6 (six) hours as needed for mild pain.     citalopram 10 MG tablet  Commonly known as:  CELEXA  Take 20 mg by mouth daily.     cloNIDine 0.1 MG tablet  Commonly known as:  CATAPRES  Take 2 tablets (0.2 mg total) by mouth 2 (two) times daily.     docusate sodium 100 MG capsule  Commonly known as:  COLACE  Take 100 mg by mouth 2 (two) times daily.     donepezil 10 MG tablet  Commonly known as:  ARICEPT  Take 10 mg by mouth at bedtime.     HYDROcodone-acetaminophen 5-325 MG per tablet  Commonly known as:  NORCO/VICODIN  Take two tablet by mouth every 6 hours as needed for pain     linagliptin 5 MG Tabs tablet  Commonly known as:  TRADJENTA  Take 5 mg by mouth daily.     metoprolol succinate 50 MG 24 hr  tablet  Commonly known as:  TOPROL-XL  Take 50 mg by mouth daily. Take with or immediately following a meal.     omeprazole 20 MG capsule  Commonly known as:  PRILOSEC  Take 20 mg by mouth daily.     pregabalin 50 MG capsule  Commonly known as:  LYRICA  Take one capsule by mouth three times daily for pains     Rivaroxaban 15 MG Tabs tablet  Commonly known as:  XARELTO  Take 1 tablet (15 mg total) by mouth daily with supper.     traMADol 50 MG tablet  Commonly known as:  ULTRAM  Take one tablet by mouth three times daily for pain. Hold for sedation        No orders of the defined types were placed in this encounter.     There is no immunization history on file for this patient.  History  Substance Use Topics  . Smoking status: Never Smoker   . Smokeless tobacco: Not on file  . Alcohol Use: No    Review of Systems  DATA OBTAINED: from patient, nurse GENERAL:  no fevers, fatigue, appetite changes SKIN: No itching, rash HEENT: No complaint RESPIRATORY: No cough, wheezing, SOB  CARDIAC: No chest pain, palpitations, lower extremity edema  GI: No abdominal pain, No N/V/D or constipation, No heartburn or reflux  GU: No dysuria, frequency or urgency, or incontinence  MUSCULOSKELETAL: No unrelieved bone/joint pain NEUROLOGIC: No headache, dizziness  PSYCHIATRIC: No overt anxiety or sadness  There were no vitals filed for this visit.  Physical Exam  GENERAL APPEARANCE: Alert, conversant,BF No acute distress  SKIN: No diaphoresis rash, or wounds HEENT: Unremarkable RESPIRATORY: Breathing is even, unlabored. Lung sounds are clear   CARDIOVASCULAR: Heart RRR no murmurs, rubs or gallops. trace peripheral edema  GASTROINTESTINAL: Abdomen is soft, non-tender, not distended w/ normal bowel sounds.  GENITOURINARY: Bladder non tender, not distended  MUSCULOSKELETAL: No abnormal joints or musculature NEUROLOGIC: Cranial nerves 2-12 grossly intact PSYCHIATRIC: dementia,  oriented to self and city, no behavioral issues  Patient Active Problem List   Diagnosis Date Noted  . Nonproliferative diabetic retinopathy associated with type 2 diabetes mellitus 10/22/2014  . Primary open angle glaucoma of both eyes 10/22/2014  . Nuclear senile cataract 10/22/2014  . Bradycardia 08/27/2014  . GERD (gastroesophageal reflux disease) 08/27/2014  . 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 Vascular dementia without behavioral disturbance 05/20/2014  . Allergic rhinitis 12/08/2013  . Atrial fibrillation, chronic 08/16/2013  . Anemia of chronic disease 08/16/2013  . Chronic pain syndrome 08/16/2013  . Altered mental status 01/28/2013  . 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 Unspecified hereditary and idiopathic peripheral neuropathy 01/28/2013  . Major depressive disorder, single episode, mild with melancholic features 01/28/2013  . CVA (cerebral infarction) 11/25/2012  . Hypertension, accelerated 11/18/2012  . New Onset Atrial fibrillation with RVR 11/18/2012  . CKD (chronic kidney disease) stage 4, GFR 15-29 ml/min 11/18/2012  . Diabetes mellitus with renal manifestations, controlled 11/18/2012  . Headache(784.0) 11/18/2012  . Acute cerebral infarction/Large acute left PCA infarct & Small acute right PCA cortical infarct involving the occipital 11/18/2012    CBC    Component Value Date/Time   WBC 5.8 06/24/2013 1430   RBC 5.03 06/24/2013 1430   HGB 14.8 06/24/2013 1430   HCT 43.0 06/24/2013 1430   PLT 339 06/24/2013 1430   MCV 85.5 06/24/2013 1430   LYMPHSABS 1.5 01/28/2013 1110   MONOABS 0.8 01/28/2013 1110   EOSABS 0.1 01/28/2013 1110   BASOSABS 0.0 01/28/2013 1110    CMP     Component Value Date/Time   NA 137 06/24/2013 1430   K 3.6 06/24/2013 1430   CL 98 06/24/2013 1430   CO2 30 06/24/2013 1430   GLUCOSE 175* 06/24/2013 1430   BUN 15  06/24/2013 1430   CREATININE 1.11* 06/24/2013 1430   CALCIUM 9.7 06/24/2013 1430   PROT 9.2* 06/24/2013 1430   ALBUMIN 3.1* 06/24/2013 1430   AST 22 06/24/2013 1430   ALT 28 06/24/2013 1430   ALKPHOS 100 06/24/2013 1430   BILITOT 0.4 06/24/2013 1430   GFRNONAA 51* 06/24/2013 1430   GFRAA 59* 06/24/2013 1430    Assessment and Plan  Bradycardia NEW problem since last visit; In April metoprolol d/c, cardiazemCD dec from 240 to 180; plan -cont monitor and cont titrate meds as needed   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

## 2015-01-01 NOTE — Assessment & Plan Note (Signed)
Lisinopril d/c with dec CrCl to 50 ;Follow up labs due

## 2015-01-01 NOTE — Assessment & Plan Note (Signed)
meds have been recently changed 2/2 bradycardia ; metoprolol d/c, cardizem dec form 240 to 180 and lisinopril d/c  Because of renal fxPlan-cont cardura 1 mg, clonidine 0.2 mg BID; prn clonidine 0.1 mg

## 2015-01-31 ENCOUNTER — Other Ambulatory Visit: Payer: Self-pay | Admitting: *Deleted

## 2015-01-31 MED ORDER — PREGABALIN 50 MG PO CAPS: ORAL_CAPSULE | ORAL | Status: DC

## 2015-01-31 NOTE — Telephone Encounter (Signed)
[facility] 

## 2015-02-01 ENCOUNTER — Non-Acute Institutional Stay (SKILLED_NURSING_FACILITY): Payer: Medicare Other | Admitting: Internal Medicine

## 2015-02-01 ENCOUNTER — Encounter: Payer: Self-pay | Admitting: Internal Medicine

## 2015-02-01 DIAGNOSIS — D638 Anemia in other chronic diseases classified elsewhere: Secondary | ICD-10-CM

## 2015-02-01 DIAGNOSIS — E1121 Type 2 diabetes mellitus with diabetic nephropathy: Secondary | ICD-10-CM

## 2015-02-01 DIAGNOSIS — E1129 Type 2 diabetes mellitus with other diabetic kidney complication: Secondary | ICD-10-CM

## 2015-02-01 DIAGNOSIS — K219 Gastro-esophageal reflux disease without esophagitis: Secondary | ICD-10-CM | POA: Diagnosis not present

## 2015-02-01 NOTE — Assessment & Plan Note (Signed)
On 6/22 A1c 6.3, good control on current regimen.

## 2015-02-01 NOTE — Assessment & Plan Note (Signed)
6/22 Hb 11.2/Dct 34.3 which is stable from prior; anemua studies and FOBT all neg; Plan- cont xarelto

## 2015-02-01 NOTE — Progress Notes (Signed)
MRN: [id number] Name: Vanisha Whiten  Sex: female Age: 69 y.o. DOB: 05/17/1946  [facility] Facility/Room:305 Level Of Care: SNF Provider: Merrilee Seashore D Emergency Contacts: Extended Emergency Contact Information Primary Emergency Contact: [name] [place] Mobile Phone: [phone] Relation: Daughter Secondary Emergency Contact: [name],[name] Address: [address]          [place], [place] Home Phone: [phone] Relation: Sister  Code Status:FULL   Allergies: Review of patient's allergies indicates no known allergies.  Chief Complaint  Patient presents with  . Acute Visit    HPI: Patient is 69 y.o. female who is being seen for routine issues.  Past Medical History  Diagnosis Date  . Hypertension   . Diabetes mellitus   . Glaucoma   . Cataract   . Stroke   . Renal disorder   . Atrial fibrillation     History reviewed. No pertinent past surgical history.    Medication List       This list is accurate as of: 02/01/15  1:19 PM.  Always use your most recent med list.               acetaminophen 325 MG tablet  Commonly known as:  TYLENOL  Take 650 mg by mouth every 6 (six) hours as needed for mild pain.     citalopram 10 MG tablet  Commonly known as:  CELEXA  Take 20 mg by mouth daily.     cloNIDine 0.1 MG tablet  Commonly known as:  CATAPRES  Take 2 tablets (0.2 mg total) by mouth 2 (two) times daily.     docusate sodium 100 MG capsule  Commonly known as:  COLACE  Take 100 mg by mouth 2 (two) times daily.     donepezil 10 MG tablet  Commonly known as:  ARICEPT  Take 10 mg by mouth at bedtime.     HYDROcodone-acetaminophen 5-325 MG per tablet  Commonly known as:  NORCO/VICODIN  Take two tablet by mouth every 6 hours as needed for pain     linagliptin 5 MG Tabs tablet  Commonly known as:  TRADJENTA  Take 5 mg by mouth daily.     metoprolol succinate 50 MG 24 hr tablet  Commonly known  as:  TOPROL-XL  Take 50 mg by mouth daily. Take with or immediately following a meal.     omeprazole 20 MG capsule  Commonly known as:  PRILOSEC  Take 20 mg by mouth daily.     pregabalin 50 MG capsule  Commonly known as:  LYRICA  Take one capsule by mouth three times daily for pains     Rivaroxaban 15 MG Tabs tablet  Commonly known as:  XARELTO  Take 1 tablet (15 mg total) by mouth daily with supper.     traMADol 50 MG tablet  Commonly known as:  ULTRAM  Take one tablet by mouth three times daily for pain. Hold for sedation        No orders of the defined types were placed in this encounter.     There is no immunization history on file for this patient.  History  Substance Use Topics  . Smoking status: Never Smoker   . Smokeless tobacco: Not on file  . Alcohol Use: No    Review of Systems  DATA OBTAINED: from patient nurse, medical record GENERAL:  no fevers, fatigue, appetite changes SKIN: No itching, rash HEENT: No complaint RESPIRATORY: No cough, wheezing, SOB CARDIAC:  No chest pain, palpitations, lower extremity edema  GI: No abdominal pain, No N/V/D or constipation, No heartburn or reflux  GU: No dysuria, frequency or urgency, or incontinence  MUSCULOSKELETAL: No unrelieved bone/joint pain NEUROLOGIC: No headache, dizziness  PSYCHIATRIC: No overt anxiety or sadness  Filed Vitals:   02/01/15 1302  BP: 133/77  Pulse: 53  Temp: 98.5 F (36.9 C)  Resp: 20    Physical Exam  GENERAL APPEARANCE: Alert, conversant, BFNo acute distress  SKIN: No diaphoresis rash, or wounds HEENT: Unremarkable RESPIRATORY: Breathing is even, unlabored. Lung sounds are clear   CARDIOVASCULAR: Heart RRR no murmurs, rubs or gallops. No peripheral edema  GASTROINTESTINAL: Abdomen is soft, non-tender, not distended w/ normal bowel sounds.  GENITOURINARY: Bladder non tender, not distended  MUSCULOSKELETAL: No abnormal joints or musculature NEUROLOGIC: Cranial nerves 2-12  grossly intact PSYCHIATRIC: Mood and affect appropriate to situation with dementia, no behavioral issues  Patient Active Problem List   Diagnosis Date Noted  . Nonproliferative diabetic retinopathy associated with type 2 diabetes mellitus 10/22/2014  . Primary open angle glaucoma of both eyes 10/22/2014  . Nuclear senile cataract 10/22/2014  . Bradycardia 08/27/2014  . GERD (gastroesophageal reflux disease) 08/27/2014  . 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 Vascular dementia without behavioral disturbance 05/20/2014  . Allergic rhinitis 12/08/2013  . Atrial fibrillation, chronic 08/16/2013  . Anemia of chronic disease 08/16/2013  . Chronic pain syndrome 08/16/2013  . Altered mental status 01/28/2013  . 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 Unspecified hereditary and idiopathic peripheral neuropathy 01/28/2013  . Major depressive disorder, single episode, mild with melancholic features 01/28/2013  . CVA (cerebral infarction) 11/25/2012  . Hypertension, accelerated 11/18/2012  . New Onset Atrial fibrillation with RVR 11/18/2012  . CKD (chronic kidney disease) stage 4, GFR 15-29 ml/min 11/18/2012  . Diabetes mellitus with renal manifestations, controlled 11/18/2012  . Headache(784.0) 11/18/2012  . Acute cerebral infarction/Large acute left PCA infarct & Small acute right PCA cortical infarct involving the occipital 11/18/2012    CBC    Component Value Date/Time   WBC 5.8 06/24/2013 1430   RBC 5.03 06/24/2013 1430   HGB 14.8 06/24/2013 1430   HCT 43.0 06/24/2013 1430   PLT 339 06/24/2013 1430   MCV 85.5 06/24/2013 1430   LYMPHSABS 1.5 01/28/2013 1110   MONOABS 0.8 01/28/2013 1110   EOSABS 0.1 01/28/2013 1110   BASOSABS 0.0 01/28/2013 1110    CMP     Component Value Date/Time   NA 137 06/24/2013 1430   K 3.6 06/24/2013 1430   CL 98 06/24/2013 1430   CO2 30  06/24/2013 1430   GLUCOSE 175* 06/24/2013 1430   BUN 15 06/24/2013 1430   CREATININE 1.11* 06/24/2013 1430   CALCIUM 9.7 06/24/2013 1430   PROT 9.2* 06/24/2013 1430   ALBUMIN 3.1* 06/24/2013 1430   AST 22 06/24/2013 1430   ALT 28 06/24/2013 1430   ALKPHOS 100 06/24/2013 1430   BILITOT 0.4 06/24/2013 1430   GFRNONAA 51* 06/24/2013 1430   GFRAA 59* 06/24/2013 1430    Assessment and Plan  Anemia of chronic disease 6/22 Hb 11.2/Dct 34.3 which is stable from prior; anemua studies and FOBT all neg; Plan- cont xarelto  Diabetes mellitus with renal manifestations, controlled On 6/22 A1c 6.3, good control on current regimen.  GERD (gastroesophageal reflux disease) No signs or sx reflux; continue prilosec    ALEXANDER, Randon Goldsmith,  MD     

## 2015-02-01 NOTE — Assessment & Plan Note (Signed)
No signs or sx reflux; continue prilosec

## 2015-02-05 ENCOUNTER — Other Ambulatory Visit: Payer: Self-pay

## 2015-06-07 LAB — BASIC METABOLIC PANEL
BUN: 33 mg/dL — AB (ref 4–21)
CREATININE: 1.9 mg/dL — AB (ref 0.5–1.1)
Potassium: 4.6 mmol/L (ref 3.4–5.3)
Sodium: 140 mmol/L (ref 137–147)

## 2015-06-07 LAB — HEMOGLOBIN A1C: HEMOGLOBIN A1C: 6.1 % — AB (ref 4.0–6.0)

## 2015-06-07 LAB — CBC AND DIFFERENTIAL
HCT: 34 % — AB (ref 36–46)
HEMOGLOBIN: 11.3 g/dL — AB (ref 12.0–16.0)

## 2015-06-13 IMAGING — CT CT HEAD W/O CM
1 of 2 series · 14 of 30 positions shown, 18 images · non-contrast
Comparison: 01/27/2013

CLINICAL DATA: Altered mental status.

CT HEAD WITHOUT CONTRAST
TECHNIQUE: Contiguous axial images were obtained from the base of
the skull through the vertex without contrast.

[Series 2: brain · axial · 0.47mm/px · z∈[+120,+235]mm · 14 of 32 slices shown, 18 images]
[im 3/32  brain]
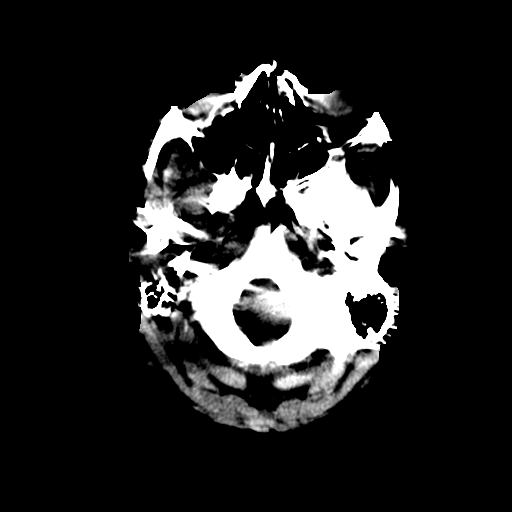
[im 3/32  bone]
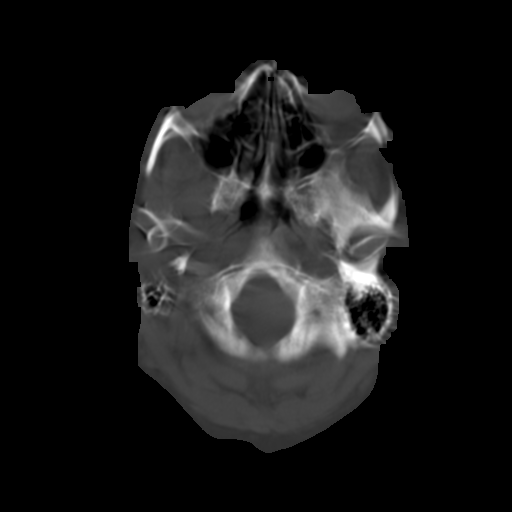
[im 5/32  brain]
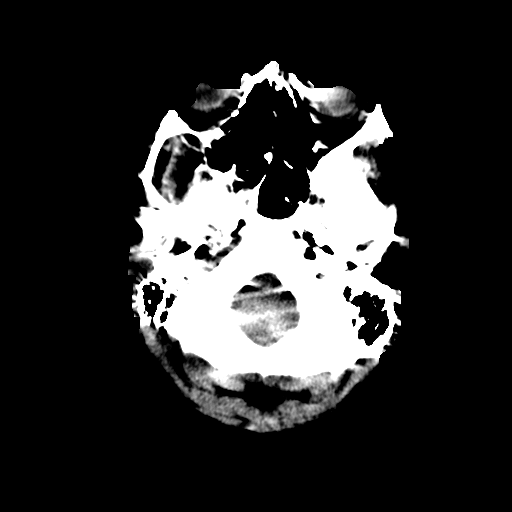
[im 7/32  brain]
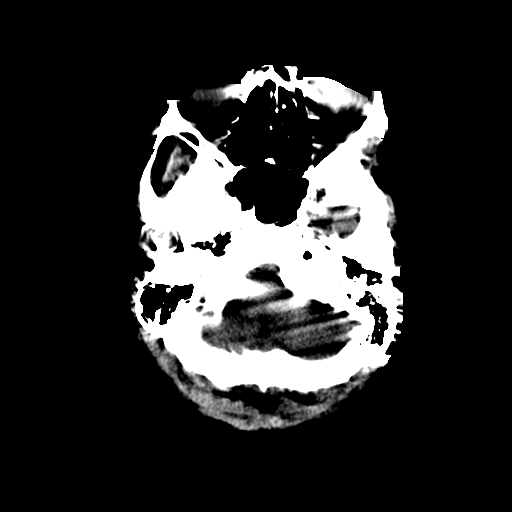
[im 9/32  brain]
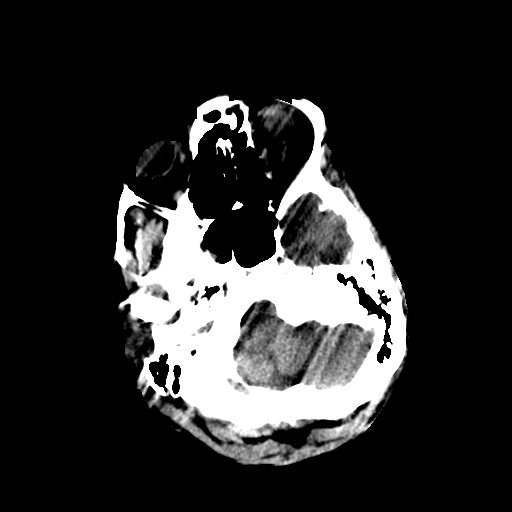
[im 11/32  brain]
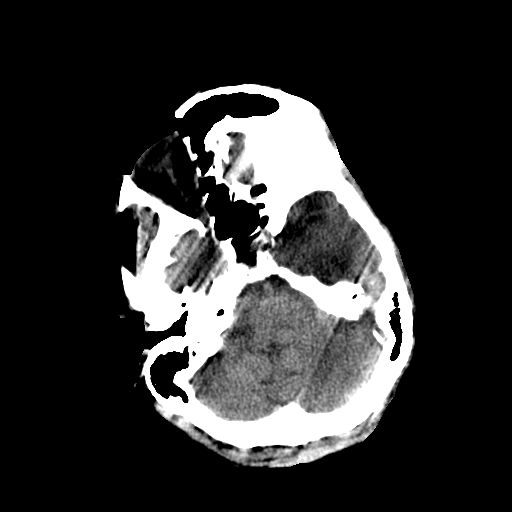
[im 11/32  bone]
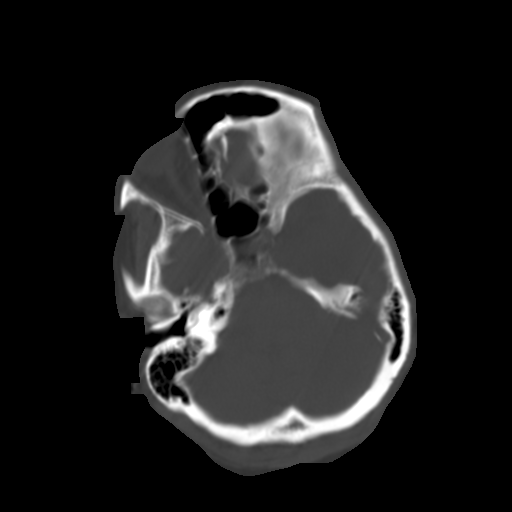
[im 13/32  brain]
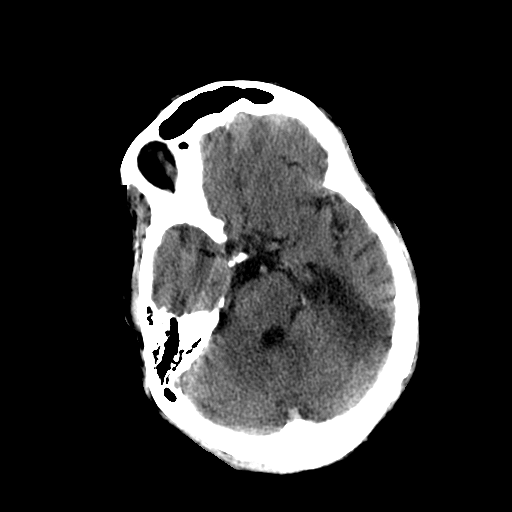
[im 15/32  brain]
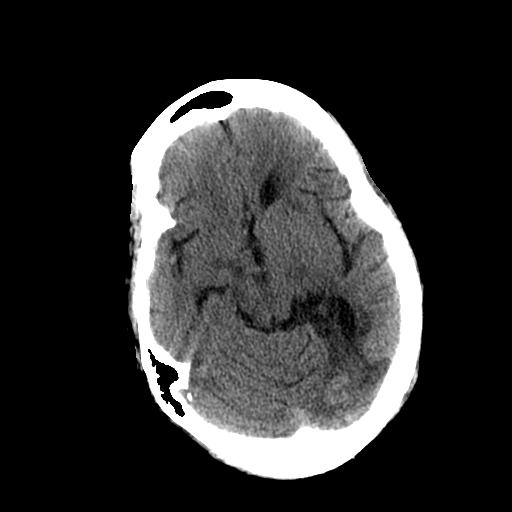
[im 17/32  brain]
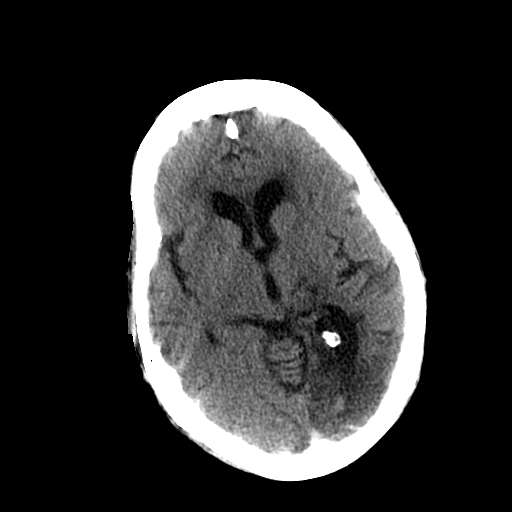
[im 19/32  brain]
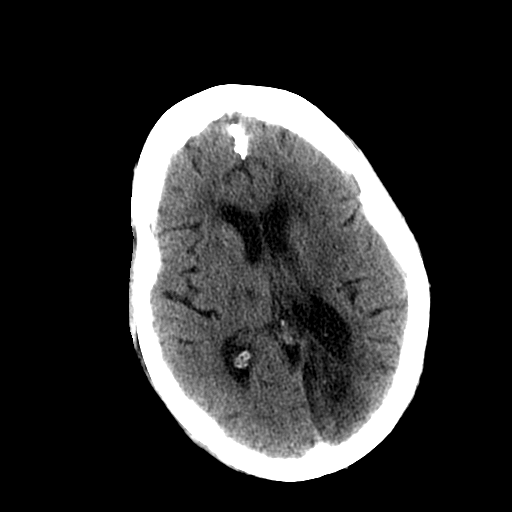
[im 19/32  bone]
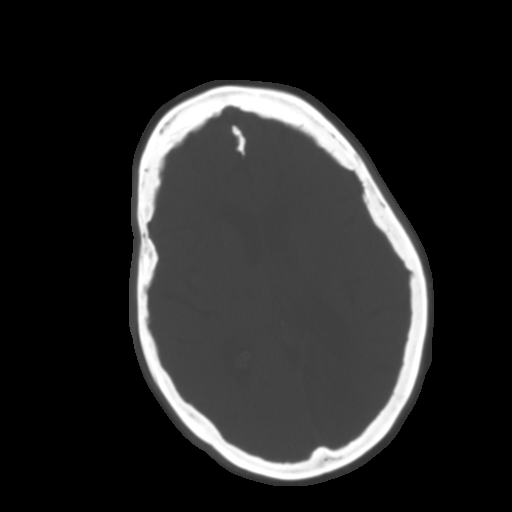
[im 21/32  brain]
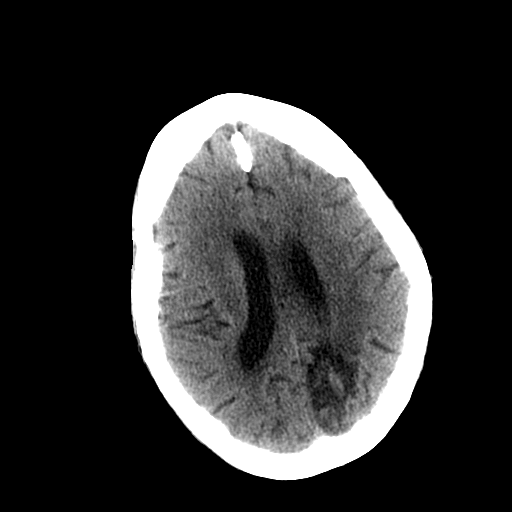
[im 23/32  brain]
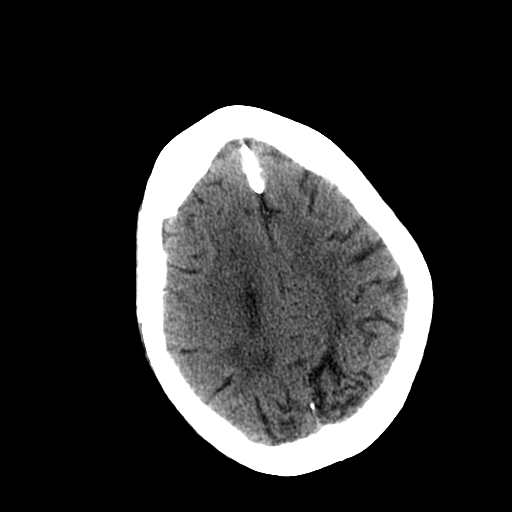
[im 25/32  brain]
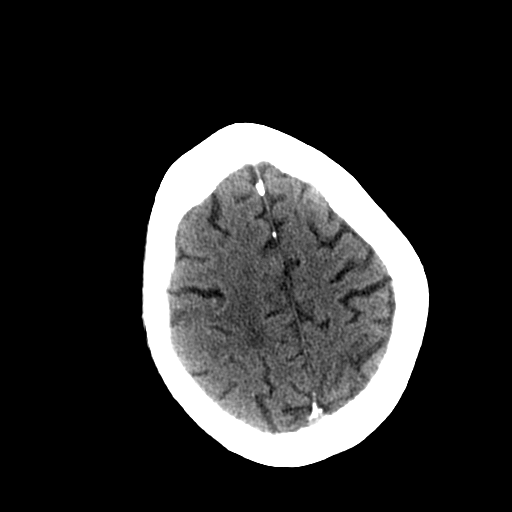
[im 27/32  brain]
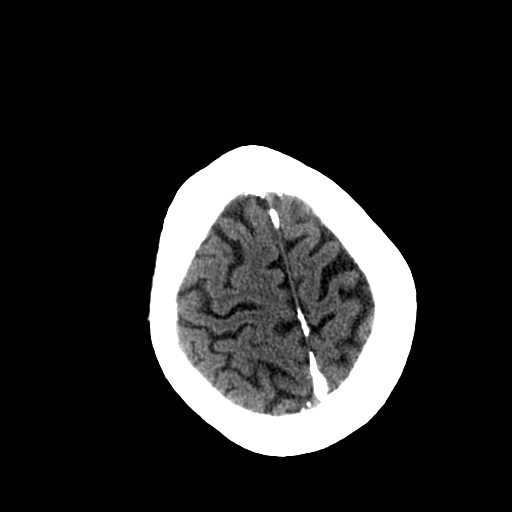
[im 27/32  bone]
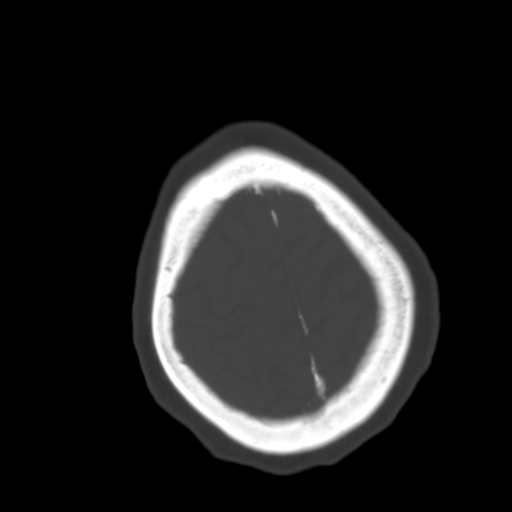
[im 29/32  brain]
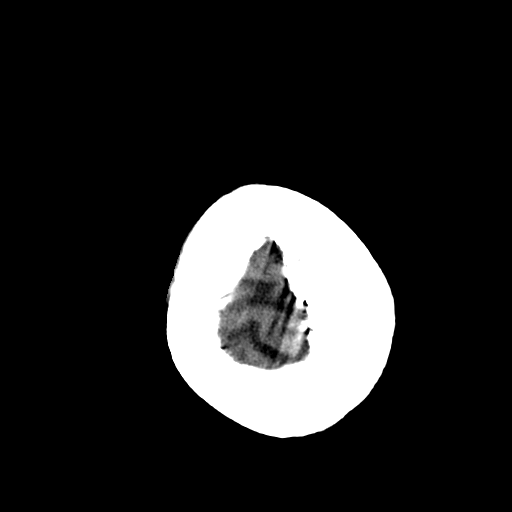

[14 of 30 positions shown; findings below may reference images not displayed]

FINDINGS: Again noted is the area of evolving chronic infarct /
encephalomalacia in the left PCA territory involving the left
occipital lobe and posterior parietal lobe.  Less pronounced area
of encephalomalacia within the posterior right parietal lobe,
stable.  Chronic microvascular changes throughout the white matter.
Old lacunar infarcts in the thalami bilaterally.  No acute
infarction.  No hemorrhage.  No hydrocephalus or extra-axial fluid
collection.  No acute bony abnormality. Visualized paranasal
sinuses and mastoids clear.  Orbital soft tissues unremarkable.
IMPRESSION: Stable appearance of the brain.  No acute findings.

## 2015-07-02 ENCOUNTER — Non-Acute Institutional Stay (SKILLED_NURSING_FACILITY): Payer: Medicare Other | Admitting: Internal Medicine

## 2015-07-02 ENCOUNTER — Encounter: Payer: Self-pay | Admitting: Internal Medicine

## 2015-07-02 DIAGNOSIS — I6930 Unspecified sequelae of cerebral infarction: Secondary | ICD-10-CM

## 2015-07-02 DIAGNOSIS — I10 Essential (primary) hypertension: Secondary | ICD-10-CM

## 2015-07-02 DIAGNOSIS — I482 Chronic atrial fibrillation, unspecified: Secondary | ICD-10-CM

## 2015-07-02 DIAGNOSIS — R001 Bradycardia, unspecified: Secondary | ICD-10-CM

## 2015-07-02 DIAGNOSIS — N183 Chronic kidney disease, stage 3 (moderate): Secondary | ICD-10-CM | POA: Diagnosis not present

## 2015-07-02 DIAGNOSIS — F320 Major depressive disorder, single episode, mild: Secondary | ICD-10-CM

## 2015-07-02 DIAGNOSIS — F0150 Vascular dementia without behavioral disturbance: Secondary | ICD-10-CM | POA: Diagnosis not present

## 2015-07-02 DIAGNOSIS — E0822 Diabetes mellitus due to underlying condition with diabetic chronic kidney disease: Secondary | ICD-10-CM

## 2015-07-02 NOTE — Progress Notes (Signed)
Patient ID: [name] Havens, female   DOB: 10/06/1945, 69 y.o.   MRN: [id number]    DATE: 07/02/15  Location:  [facility]    Place of Service: SNF (31)   Extended Emergency Contact Information Primary Emergency Contact: Rosanne AshingMcLean,Ivy  [place] Mobile Phone: [phone] Relation: Daughter Secondary Emergency Contact: [name],[name] Address: [address]          [place] Home Phone: [phone] Relation: Sister  Advanced Directive information  DNR  Chief Complaint  Patient presents with  . Medical Management of Chronic Issues    HPI:  69 yo female long term resident seen today for f/u. She c/o right leg pain intermittent x unknown duration. She has hx CVA with deficits.  Unable to obtain further HPI due to dementia. Hx obtained from chart  HTN - BP stable on metoprolol and clonidine  DM - stable on tradjenta. A1c <7%  afib - rate controlled on metoprolol. Takes xeralto for anticoagulation  Dementia/MDD - cognition stable on aricept. Mood stable on celexa  Hx CVA - on xeralto. Stable.  Chronic pain - stable on prn norco, tramadol and lyrica  CKD - Cr 1.9  Anemia - Hgb stable at 11.3  Past Medical History  Diagnosis Date  . Hypertension   . Diabetes mellitus   . Glaucoma   . Cataract   . Stroke (HCC)   . Renal disorder   . Atrial fibrillation (HCC)     No past surgical history on file.  Patient Care Team: Kimber RelicArthur G Green, MD as PCP - General (Internal Medicine)  Social History   Social History  . Marital Status: Widowed    Spouse Name: N/A  . Number of Children: N/A  . Years of Education: N/A   Occupational History  . Not on file.   Social History Main Topics  . Smoking status: Never Smoker   . Smokeless tobacco: Not on file  . Alcohol Use: No  . Drug Use: No  . Sexual Activity: Not on file   Other Topics Concern  . Not on file   Social History Narrative     reports that  she has never smoked. She does not have any smokeless tobacco history on file. She reports that she does not drink alcohol or use illicit drugs.   There is no immunization history on file for this patient.  No Known Allergies  Medications: Patient's Medications  New Prescriptions   No medications on file  Previous Medications   ACETAMINOPHEN (TYLENOL) 325 MG TABLET    Take 650 mg by mouth every 6 (six) hours as needed for mild pain.   CITALOPRAM (CELEXA) 10 MG TABLET    Take 20 mg by mouth daily.    CLONIDINE (CATAPRES) 0.1 MG TABLET    Take 2 tablets (0.2 mg total) by mouth 2 (two) times daily.   DOCUSATE SODIUM (COLACE) 100 MG CAPSULE    Take 100 mg by mouth 2 (two) times daily.   DONEPEZIL (ARICEPT) 10 MG TABLET    Take 10 mg by mouth at bedtime.   HYDROCODONE-ACETAMINOPHEN (NORCO/VICODIN) 5-325 MG PER TABLET    Take two tablet by mouth every 6 hours as needed for pain   LINAGLIPTIN (TRADJENTA) 5 MG TABS TABLET    Take 5 mg by mouth daily.   METOPROLOL SUCCINATE (TOPROL-XL) 50 MG 24 HR TABLET    Take 50 mg by mouth daily. Take with or immediately following a meal.  OMEPRAZOLE (PRILOSEC) 20 MG CAPSULE    Take 20 mg by mouth daily.   PREGABALIN (LYRICA) 50 MG CAPSULE    Take one capsule by mouth three times daily for pains   RIVAROXABAN (XARELTO) 15 MG TABS TABLET    Take 1 tablet (15 mg total) by mouth daily with supper.   TRAMADOL (ULTRAM) 50 MG TABLET    Take one tablet by mouth three times daily for pain. Hold for sedation  Modified Medications   No medications on file  Discontinued Medications   No medications on file    Review of Systems  Unable to perform ROS: Dementia    Filed Vitals:   07/02/15 1630  BP: 140/76  Pulse: 55  Temp: 98.1 F (36.7 C)  Weight: 242 lb 9.6 oz (110.043 kg)   Body mass index is 42.99 kg/(m^2).  Physical Exam  Constitutional: She appears well-developed and well-nourished.  Sitting in w/c in NAD  HENT:  Mouth/Throat: Oropharynx is clear  and moist. No oropharyngeal exudate.  Eyes: Pupils are equal, round, and reactive to light. No scleral icterus.  Neck: Neck supple. Carotid bruit is not present. No tracheal deviation present. No thyromegaly present.  Cardiovascular: Regular rhythm, normal heart sounds and intact distal pulses.  Bradycardia present.  Exam reveals no gallop and no friction rub.   No murmur heard. RLE swelling but no overt edema. No calf TTP b/l. No LLE edema  Pulmonary/Chest: Effort normal and breath sounds normal. No stridor. No respiratory distress. She has no wheezes. She has no rales.  Abdominal: Soft. Bowel sounds are normal. She exhibits no distension and no mass. There is no hepatomegaly. There is no tenderness. There is no rebound and no guarding.  Musculoskeletal: She exhibits edema.  Lymphadenopathy:    She has no cervical adenopathy.  Neurological: She is alert.  Right hemiparesis with strength 4/5; expressive aphasia  Skin: Skin is warm and dry. No rash noted.  Psychiatric: She has a normal mood and affect. Her behavior is normal. Her speech is slurred.     Labs reviewed: CBC Latest Ref Rng 06/07/2015 06/24/2013 01/28/2013  WBC 4.0 - 10.5 K/uL - 5.8 6.9  Hemoglobin 12.0 - 16.0 g/dL 11.3(A) 14.8 13.5  Hematocrit 36 - 46 % 34(A) 43.0 40.2  Platelets 150 - 400 K/uL - 339 349    CMP Latest Ref Rng 06/07/2015 06/24/2013 02/01/2013  Glucose 70 - 99 mg/dL - 409(W) 119(J)  BUN 4 - 21 mg/dL 47(W) 15 22  Creatinine 0.5 - 1.1 mg/dL 1.9(A) 1.11(H) 1.44(H)  Sodium 137 - 147 mmol/L 140 137 136  Potassium 3.4 - 5.3 mmol/L 4.6 3.6 4.2  Chloride 96 - 112 mEq/L - 98 101  CO2 19 - 32 mEq/L - 30 26  Calcium 8.4 - 10.5 mg/dL - 9.7 9.0  Total Protein 6.0 - 8.3 g/dL - 9.2(H) 7.1  Total Bilirubin 0.3 - 1.2 mg/dL - 0.4 0.4  Alkaline Phos 39 - 117 U/L - 100 61  AST 0 - 37 U/L - 22 15  ALT 0 - 35 U/L - 28 19  Iron                 35 Magnesium    1.9    No results found.   Assessment/Plan   ICD-9-CM  ICD-10-CM   1. Bradycardia 427.89 R00.1   2. Atrial fibrillation, chronic (HCC) 427.31 I48.2   3. Vascular dementia without behavioral disturbance 290.40 F01.50   4. Essential hypertension 401.9 I10  5. Major depressive disorder, single episode, mild with melancholic features (HCC) 296.21 F32.0   6. Diabetes mellitus due to underlying condition, controlled, with stage 3 chronic kidney disease, unspecified long term insulin use status (HCC) 249.40 E08.22    585.3 N18.3   7. History of CVA with residual deficit 438.9 I69.30    right hemiparesis; aphasia    Pt is medically stable on current tx plan. Continue current medications as ordered. PT/OT/ST as indicated. Will follow  Javaria Knapke S. Ancil Linsey  [facility] [address] [place], [place] [address] [phone] Cell (Monday-Friday 8 AM - 5 PM) [phone] After 5 PM and follow prompts

## 2015-07-09 ENCOUNTER — Other Ambulatory Visit: Payer: Self-pay | Admitting: *Deleted

## 2015-07-09 MED ORDER — TRAMADOL HCL 50 MG PO TABS: ORAL_TABLET | ORAL | Status: DC

## 2015-07-09 NOTE — Telephone Encounter (Signed)
[facility] 

## 2015-08-31 LAB — CBC AND DIFFERENTIAL
HCT: 37 % (ref 36–46)
HEMOGLOBIN: 11.8 g/dL — AB (ref 12.0–16.0)
PLATELETS: 279 10*3/uL (ref 150–399)
WBC: 5.5 10*3/mL

## 2015-08-31 LAB — LIPID PANEL
CHOLESTEROL: 123 mg/dL (ref 0–200)
HDL: 48 mg/dL (ref 35–70)
LDL Cholesterol: 58 mg/dL
TRIGLYCERIDES: 87 mg/dL (ref 40–160)

## 2015-08-31 LAB — BASIC METABOLIC PANEL
BUN: 48 mg/dL — AB (ref 4–21)
CREATININE: 1.9 mg/dL — AB (ref 0.5–1.1)
Glucose: 114 mg/dL
POTASSIUM: 4.4 mmol/L (ref 3.4–5.3)
Sodium: 139 mmol/L (ref 137–147)

## 2015-08-31 LAB — HEPATIC FUNCTION PANEL
ALT: 13 U/L (ref 7–35)
AST: 15 U/L (ref 13–35)
Alkaline Phosphatase: 94 U/L (ref 25–125)

## 2015-08-31 LAB — HEMOGLOBIN A1C: Hemoglobin A1C: 7.8

## 2015-09-20 ENCOUNTER — Other Ambulatory Visit: Payer: Self-pay | Admitting: *Deleted

## 2015-09-20 ENCOUNTER — Encounter: Payer: Self-pay | Admitting: Adult Health

## 2015-09-20 MED ORDER — TRAMADOL HCL 50 MG PO TABS: ORAL_TABLET | ORAL | Status: DC

## 2015-09-20 MED ORDER — HYDROCODONE-ACETAMINOPHEN 5-325 MG PO TABS: ORAL_TABLET | ORAL | Status: AC

## 2015-09-20 MED ORDER — PREGABALIN 50 MG PO CAPS: ORAL_CAPSULE | ORAL | Status: AC

## 2015-09-20 NOTE — Progress Notes (Signed)
Patient ID: [name], female   DOB: 1946/04/17, 70 y.o.   MRN: [id number]  This encounter was created in error - please disregard.

## 2015-09-21 ENCOUNTER — Encounter: Payer: Self-pay | Admitting: Internal Medicine

## 2015-09-21 ENCOUNTER — Non-Acute Institutional Stay (SKILLED_NURSING_FACILITY): Payer: Medicare Other | Admitting: Internal Medicine

## 2015-09-21 DIAGNOSIS — K5900 Constipation, unspecified: Secondary | ICD-10-CM | POA: Diagnosis not present

## 2015-09-21 DIAGNOSIS — G894 Chronic pain syndrome: Secondary | ICD-10-CM

## 2015-09-21 DIAGNOSIS — I482 Chronic atrial fibrillation, unspecified: Secondary | ICD-10-CM

## 2015-09-21 DIAGNOSIS — I69391 Dysphagia following cerebral infarction: Secondary | ICD-10-CM

## 2015-09-21 DIAGNOSIS — I1310 Hypertensive heart and chronic kidney disease without heart failure, with stage 1 through stage 4 chronic kidney disease, or unspecified chronic kidney disease: Secondary | ICD-10-CM

## 2015-09-21 DIAGNOSIS — F32A Depression, unspecified: Secondary | ICD-10-CM

## 2015-09-21 DIAGNOSIS — N189 Chronic kidney disease, unspecified: Secondary | ICD-10-CM

## 2015-09-21 DIAGNOSIS — E559 Vitamin D deficiency, unspecified: Secondary | ICD-10-CM | POA: Diagnosis not present

## 2015-09-21 DIAGNOSIS — N183 Chronic kidney disease, stage 3 unspecified: Secondary | ICD-10-CM

## 2015-09-21 DIAGNOSIS — K5909 Other constipation: Secondary | ICD-10-CM | POA: Insufficient documentation

## 2015-09-21 DIAGNOSIS — F329 Major depressive disorder, single episode, unspecified: Secondary | ICD-10-CM | POA: Diagnosis not present

## 2015-09-21 DIAGNOSIS — K219 Gastro-esophageal reflux disease without esophagitis: Secondary | ICD-10-CM

## 2015-09-21 DIAGNOSIS — F0150 Vascular dementia without behavioral disturbance: Secondary | ICD-10-CM | POA: Diagnosis not present

## 2015-09-21 DIAGNOSIS — E1122 Type 2 diabetes mellitus with diabetic chronic kidney disease: Secondary | ICD-10-CM | POA: Diagnosis not present

## 2015-09-21 DIAGNOSIS — E1142 Type 2 diabetes mellitus with diabetic polyneuropathy: Secondary | ICD-10-CM | POA: Diagnosis not present

## 2015-09-21 DIAGNOSIS — D631 Anemia in chronic kidney disease: Secondary | ICD-10-CM

## 2015-09-21 DIAGNOSIS — J3089 Other allergic rhinitis: Secondary | ICD-10-CM

## 2015-09-21 NOTE — Progress Notes (Signed)
This encounter was created in error - please disregard.  This encounter was created in error - please disregard.

## 2015-09-21 NOTE — Progress Notes (Signed)
Patient ID: [name], female   DOB: 1946-06-28, 70 y.o.   MRN: [id number]    LOCATION: [facility]  PCP: [name], MD   Code Status: DNR  Goals of care: Advanced Directive information Advanced Directives 09/21/2015  Does patient have an advance directive? Yes  Type of Advance Directive Out of facility DNR (pink MOST or yellow form)  Does patient want to make changes to advanced directive? No - Patient declined  Copy of advanced directive(s) in chart? Yes    Extended Emergency Contact Information Primary Emergency Contact: [facility] Address: [address]          [place], [place] [address] [place] Phone: [phone] Relation: Daughter Secondary Emergency Contact: [facility] Address: [address] [place], [place] [address] [place] Home Phone: [phone] Work Phone: [phone] Relation: Sister   No Known Allergies  Chief Complaint  Patient presents with  . New Admit To SNF    New Admission     HPI:  Patient is a 70 y.o. female seen today for long term care. She has medical history of chronic afib, HTN, CKD stage 3, gerd, DM, CVA, dementia among others. She was residing in another SNF prior to this. She can feed herself. She needs assistance with bathing, dressing and toileting. She is dependent with transfers and uses a wheelchair. She denies any complaint this visit.   Review of Systems:  Constitutional: Negative for fever, chills, diaphoresis.  HENT: Negative for headache, congestion, nasal discharge.   Eyes: Negative for blurred vision, double vision and discharge.  Respiratory: Negative for cough, shortness of breath and wheezing.   Cardiovascular: Negative for chest pain, palpitations Gastrointestinal: Negative for heartburn, nausea, vomiting, abdominal pain. Genitourinary: Negative for dysuria Musculoskeletal: Negative for back pain.  Skin: Negative for itching, rash.  Neurological:  Negative for dizziness. Psychiatric/Behavioral: Negative for depression. has memory loss.    Past Medical History  Diagnosis Date  . Hypertension   . Diabetes mellitus   . Glaucoma   . Cataract   . Stroke (HCC)   . Renal disorder   . Atrial fibrillation (HCC)    History reviewed. No pertinent past surgical history. Social History:   reports that she has never smoked. She does not have any smokeless tobacco history on file. She reports that she does not drink alcohol or use illicit drugs.  Family History  Problem Relation Age of Onset  . Stroke Mother   . Diabetes type II Mother   . CAD Father     Medications:   Medication List       This list is accurate as of: 09/21/15  9:23 AM.  Always use your most recent med list.               acetaminophen 325 MG tablet  Commonly known as:  TYLENOL  Take 650 mg by mouth every 6 (six) hours as needed for mild pain.     BIOFREEZE 4 % Gel  Generic drug:  Menthol (Topical Analgesic)  Apply topically 3 (three) times daily.     cetirizine 10 MG tablet  Commonly known as:  ZYRTEC  Take 10 mg by mouth daily.     citalopram 10 MG tablet  Commonly known as:  CELEXA  Take 20 mg by mouth daily.     cloNIDine 0.1 MG tablet  Commonly known as:  CATAPRES  Take 0.1 mg by mouth  daily as needed (For BP >170/90).     cloNIDine 0.2 MG tablet  Commonly known as:  CATAPRES  Take 0.2 mg by mouth 2 (two) times daily.     diltiazem 120 MG tablet  Commonly known as:  CARDIZEM  Take 120 mg by mouth daily.     docusate sodium 100 MG capsule  Commonly known as:  COLACE  Take 100 mg by mouth 2 (two) times daily. Hold for loose stools     donepezil 10 MG tablet  Commonly known as:  ARICEPT  Take 10 mg by mouth at bedtime.     doxazosin 2 MG tablet  Commonly known as:  CARDURA  Take 2 mg by mouth daily.     ferrous sulfate 325 (65 FE) MG tablet  Take 325 mg by mouth 2 (two) times daily with a meal.     hydrochlorothiazide 12.5 MG  capsule  Commonly known as:  MICROZIDE  Take 12.5 mg by mouth daily.     HYDROcodone-acetaminophen 5-325 MG tablet  Commonly known as:  NORCO/VICODIN  Take two tablet by mouth every 6 hours as needed for pain     linagliptin 5 MG Tabs tablet  Commonly known as:  TRADJENTA  Take 5 mg by mouth daily.     lisinopril 10 MG tablet  Commonly known as:  PRINIVIL,ZESTRIL  Take 10 mg by mouth daily.     omeprazole 20 MG capsule  Commonly known as:  PRILOSEC  Take 20 mg by mouth daily.     polyethylene glycol packet  Commonly known as:  MIRALAX / GLYCOLAX  Take 17 g by mouth daily.     pregabalin 50 MG capsule  Commonly known as:  LYRICA  Take one capsule by mouth three times daily for pains     Rivaroxaban 15 MG Tabs tablet  Commonly known as:  XARELTO  Take 1 tablet (15 mg total) by mouth daily with supper.     SYSTANE ULTRA 0.4-0.3 % Soln  Generic drug:  Polyethyl Glycol-Propyl Glycol  Apply 1 drop to eye 2 (two) times daily. Both eyes.  Give 3-5 minutes between 2 different eye meds     traMADol 50 MG tablet  Commonly known as:  ULTRAM  Take one tablet by mouth three times daily for pain. Hold for sedation     vitamin C 500 MG tablet  Commonly known as:  ASCORBIC ACID  Take 500 mg by mouth 2 (two) times daily.     Vitamin D3 50000 units Caps  Take 1 capsule by mouth every 30 (thirty) days.        Immunizations: Immunization History  Administered Date(s) Administered  . Influenza-Unspecified 05/23/2015     Physical Exam: Filed Vitals:   09/21/15 0906  BP: 166/92  Pulse: 67  Temp: 96.4 F (35.8 C)  TempSrc: Oral  Resp: 16  Height:  (1.6 m)  Weight: 226 lb (102.513 kg)  SpO2: 98%   Body mass index is 40.04 kg/(m^2).  General- elderly female, obese, in no acute distress Head- normocephalic, atraumatic Nose- no maxillary or frontal sinus tenderness, no nasal discharge Throat- moist mucus membrane, poor dentition  Eyes- PERRLA, EOMI, no pallor, no  icterus, no discharge, normal conjunctiva, normal sclera Neck- no cervical lymphadenopathy Cardiovascular- normal s1,s2, no murmurs, no leg edema, diminished pedal pulses Respiratory- bilateral clear to auscultation, no wheeze, no rhonchi, no crackles, no use of accessory muscles Abdomen- bowel sounds present, soft, non tender Musculoskeletal- able to move  all 4 extremities, generalized weakness, strength 4/5 in UE and 3/5 in LE, ROM on right shoulder limited > left shoulder, self propels on wheelchair Neurological- alert and oriented to person only Skin- warm and dry, no skin breakdown Psychiatry- normal mood and affect    Labs reviewed: Basic Metabolic Panel:  Recent Labs  16/10/96 08/31/15  NA 140 139  K 4.6 4.4  BUN 33* 48*  CREATININE 1.9* 1.9*   Liver Function Tests:  Recent Labs  08/31/15  AST 15  ALT 13  ALKPHOS 94   No results for input(s): LIPASE, AMYLASE in the last 8760 hours. No results for input(s): AMMONIA in the last 8760 hours. CBC:  Recent Labs  06/07/15 08/31/15  WBC  --  5.5  HGB 11.3* 11.8*  HCT 34* 37  PLT  --  279    Assessment/Plan  ckd stage 3 With history of DM and HTN. Continue lisinopril for renal protection and monitor BMP  Dm type 2 in obese Lab Results  Component Value Date   HGBA1C 7.8 08/31/2015  monitor cbg. Continue tradjenta. Continue lisinopril for renal protection  Diabetic polyneuropathy Continue diabetes manage,ent. Continue lyrica. Will need routine foot exam  HTN BP Readings from Last 3 Encounters:  09/21/15 166/92  09/20/15 149/94  07/02/15 140/76  Elevated SBP. Check bp bid x 1 week. With her SBP greater than 130, change clonidine to 0.2 mg tid. Continue lisinopril 10 mg daily and discontinue hctz given her ckd stage 3. Continue cardura.   Allergic rhinitis Stable. Continue zyrtec for now  Vitamin d def Continue vitamin d for now. Check vitamin d level  Chronic depression Stable, continue  celexa  afib Rate controlled. Continue cardizem 120 mg daily for rate control. Continue xarelto for anticoagulation  Constipation On colace 100 mg bid and miralax daily, monitor  Dementia Continue aricept for now and monitor  Anemia of chronic disease Monitor cbc, continue iron supplement and vitamin c  gerd Stable symptom, continue prilosec  Chronic pain syndrome On prn tramadol and prn norco at present. Monitor clinically, encourage to be OOB. Will have PT evaluate for restorative program to help maintain her independence with ADLs.   Dysphagia Post CVA. On mechanical soft diet at present. Will have SLP evaluate for screening.    Goals of care: long term care   Labs/tests ordered: cbc, cmp, a1c, lipid panel, vitamin d , tsh in 1 month  Family/ staff Communication: reviewed care plan with patient and nursing supervisor    Oneal Grout, MD Internal Medicine [facility] [address] [place], [place] [address] Cell Phone (Monday-Friday 8 am - 5 pm): [phone] On Call: [phone] and follow prompts after 5 pm and on weekends Office Phone: [phone] Office Fax: [phone]

## 2015-10-23 LAB — BASIC METABOLIC PANEL
BUN: 34 mg/dL — AB (ref 4–21)
Creatinine: 1.8 mg/dL — AB (ref 0.5–1.1)
Glucose: 104 mg/dL
POTASSIUM: 5.1 mmol/L (ref 3.4–5.3)
Sodium: 140 mmol/L (ref 137–147)

## 2015-10-23 LAB — TSH: TSH: 1.11 u[IU]/mL (ref 0.41–5.90)

## 2015-10-23 LAB — CBC AND DIFFERENTIAL
HCT: 37 % (ref 36–46)
HEMOGLOBIN: 12 g/dL (ref 12.0–16.0)
Platelets: 275 10*3/uL (ref 150–399)
WBC: 5 10*3/mL

## 2015-10-23 LAB — LIPID PANEL
CHOLESTEROL: 110 mg/dL (ref 0–200)
HDL: 47 mg/dL (ref 35–70)
LDL Cholesterol: 47 mg/dL
Triglycerides: 80 mg/dL (ref 40–160)

## 2015-10-23 LAB — HEPATIC FUNCTION PANEL
ALT: 9 U/L (ref 7–35)
AST: 10 U/L — AB (ref 13–35)
Alkaline Phosphatase: 83 U/L (ref 25–125)
Bilirubin, Total: 0.4 mg/dL

## 2015-10-23 LAB — HEMOGLOBIN A1C: Hemoglobin A1C: 7

## 2015-11-21 ENCOUNTER — Non-Acute Institutional Stay (SKILLED_NURSING_FACILITY): Payer: Medicare Other | Admitting: Internal Medicine

## 2015-11-21 ENCOUNTER — Encounter: Payer: Self-pay | Admitting: Internal Medicine

## 2015-11-21 DIAGNOSIS — E1129 Type 2 diabetes mellitus with other diabetic kidney complication: Secondary | ICD-10-CM | POA: Diagnosis not present

## 2015-11-21 DIAGNOSIS — R809 Proteinuria, unspecified: Secondary | ICD-10-CM | POA: Diagnosis not present

## 2015-11-21 DIAGNOSIS — M79604 Pain in right leg: Secondary | ICD-10-CM

## 2015-11-21 DIAGNOSIS — N183 Chronic kidney disease, stage 3 unspecified: Secondary | ICD-10-CM

## 2015-11-21 DIAGNOSIS — F0390 Unspecified dementia without behavioral disturbance: Secondary | ICD-10-CM | POA: Diagnosis not present

## 2015-11-21 DIAGNOSIS — E559 Vitamin D deficiency, unspecified: Secondary | ICD-10-CM | POA: Diagnosis not present

## 2015-11-21 NOTE — Progress Notes (Signed)
Patient ID: [name], female   DOB: 1945/11/15, 70 y.o.   MRN: [id number]    LOCATION: [facility]  PCP: [name], MD   Code Status: DNR  Goals of care: Advanced Directive information Advanced Directives 09/21/2015  Does patient have an advance directive? Yes  Type of Advance Directive Out of facility DNR (pink MOST or yellow form)  Does patient want to make changes to advanced directive? No - Patient declined  Copy of advanced directive(s) in chart? Yes    Extended Emergency Contact Information Primary Emergency Contact: [facility] Address: [address]          [place], [place] [address] [place] Phone: [phone] Relation: Daughter Secondary Emergency Contact: [facility] Address: [address] [place], [place] [address] [place] Home Phone: [phone] Work Phone: [phone] Relation: Sister   No Known Allergies  Chief Complaint  Patient presents with  . Medical Management of Chronic Issues    Routine Visit     HPI:  Patient is a 70 y.o. female seen today for routine visit. She complaints of pain to her right leg and groin area. She mentions this being chronic issue and worsening at present denies any left leg pain. Reviewed her labs this visit. She has medical history of chronic afib, HTN, CKD stage 3, gerd, DM, CVA, dementia among others.   Review of Systems:  Constitutional: Negative for fever.  HENT: Negative for headache, congestion   Eyes: Negative for blurred vision Respiratory: Negative for cough, shortness of breath Cardiovascular: Negative for chest pain, palpitations Gastrointestinal: Negative for heartburn, nausea, vomiting, abdominal pain. Genitourinary: Negative for dysuria Musculoskeletal: Negative for back pain.  Skin: Negative for itching, rash.  Neurological: Negative for dizziness. Psychiatric/Behavioral: Negative for depression. has memory loss.    Past Medical  History  Diagnosis Date  . Hypertension   . Diabetes mellitus   . Glaucoma   . Cataract   . Stroke (HCC)   . Renal disorder   . Atrial fibrillation (HCC)    No past surgical history on file. Social History:   reports that she has never smoked. She does not have any smokeless tobacco history on file. She reports that she does not drink alcohol or use illicit drugs.  Family History  Problem Relation Age of Onset  . Stroke Mother   . Diabetes type II Mother   . CAD Father     Medications:   Medication List       This list is accurate as of: 11/21/15  3:19 PM.  Always use your most recent med list.               acetaminophen 325 MG tablet  Commonly known as:  TYLENOL  Take 650 mg by mouth every 6 (six) hours as needed for mild pain.     BIOFREEZE 4 % Gel  Generic drug:  Menthol (Topical Analgesic)  Apply topically 3 (three) times daily.     cetirizine 10 MG tablet  Commonly known as:  ZYRTEC  Take 10 mg by mouth daily.     citalopram 10 MG tablet  Commonly known as:  CELEXA  Take 20 mg by mouth daily.     cloNIDine 0.1 MG tablet  Commonly known as:  CATAPRES  Take 0.1 mg by mouth daily as needed (For BP >170/90).     cloNIDine 0.2 MG tablet  Commonly known as:  CATAPRES  Take 0.2 mg by mouth 3 (three) times daily.     diltiazem 120 MG tablet  Commonly known as:  CARDIZEM  Take 120 mg by mouth daily.     docusate sodium 100 MG capsule  Commonly known as:  COLACE  Take 100 mg by mouth 2 (two) times daily. Hold for loose stools     donepezil 10 MG tablet  Commonly known as:  ARICEPT  Take 10 mg by mouth at bedtime.     doxazosin 2 MG tablet  Commonly known as:  CARDURA  Take 2 mg by mouth daily.     ferrous sulfate 325 (65 FE) MG tablet  Take 325 mg by mouth 2 (two) times daily with a meal.     HYDROcodone-acetaminophen 5-325 MG tablet  Commonly known as:  NORCO/VICODIN  Take two tablet by mouth every 6 hours as needed for pain     linagliptin 5  MG Tabs tablet  Commonly known as:  TRADJENTA  Take 5 mg by mouth daily.     lisinopril 10 MG tablet  Commonly known as:  PRINIVIL,ZESTRIL  Take 10 mg by mouth daily.     omeprazole 20 MG capsule  Commonly known as:  PRILOSEC  Take 20 mg by mouth daily.     polyethylene glycol packet  Commonly known as:  MIRALAX / GLYCOLAX  Take 17 g by mouth daily.     pregabalin 50 MG capsule  Commonly known as:  LYRICA  Take one capsule by mouth three times daily for pains     Rivaroxaban 15 MG Tabs tablet  Commonly known as:  XARELTO  Take 1 tablet (15 mg total) by mouth daily with supper.     SYSTANE ULTRA 0.4-0.3 % Soln  Generic drug:  Polyethyl Glycol-Propyl Glycol  Apply 1 drop to eye 2 (two) times daily. Both eyes.  Give 3-5 minutes between 2 different eye meds     traMADol 50 MG tablet  Commonly known as:  ULTRAM  Take one tablet by mouth three times daily for pain. Hold for sedation     vitamin C 500 MG tablet  Commonly known as:  ASCORBIC ACID  Take 500 mg by mouth 2 (two) times daily.     Vitamin D3 50000 units Caps  Take 1 capsule by mouth every 30 (thirty) days.        Immunizations: Immunization History  Administered Date(s) Administered  . Influenza-Unspecified 05/23/2015     Physical Exam: Filed Vitals:   11/21/15 1507  BP: 148/88  Pulse: 89  Temp: 97.3 F (36.3 C)  TempSrc: Oral  Resp: 20  Height: 5\' 3"  (1.6 m)  Weight: 246 lb 9.6 oz (111.857 kg)  SpO2: 93%   Body mass index is 43.69 kg/(m^2).  General- elderly female, obese, in no acute distress Head- normocephalic, atraumatic Nose- no nasal discharge Throat- moist mucus membrane, poor dentition  Eyes- PERRLA, EOMI, no pallor, no icterus, no discharge, normal conjunctiva, normal sclera Neck- no cervical lymphadenopathy Cardiovascular- normal s1,s2, no murmurs, no leg edema, diminished pedal pulses Respiratory- bilateral clear to auscultation, no wheeze, no rhonchi, no crackles, no use of  accessory muscles Abdomen- bowel sounds present, soft, non tender Musculoskeletal- able to move all 4 extremities, generalized weakness, strength 4/5 in UE and 3/5 in LE, ROM on right shoulder limited > left shoulder, limited ROM to both hips Neurological- alert and oriented to person only Skin- warm and dry, no skin breakdown Psychiatry- normal mood and  affect    Labs reviewed: Basic Metabolic Panel:  Recent Labs  16/10/96 08/31/15 10/23/15  NA 140 139 140  K 4.6 4.4 5.1  BUN 33* 48* 34*  CREATININE 1.9* 1.9* 1.8*   Liver Function Tests:  Recent Labs  08/31/15 10/23/15  AST 15 10*  ALT 13 9  ALKPHOS 94 83   CBC:  Recent Labs  06/07/15 08/31/15 10/23/15  WBC  --  5.5 5.0  HGB 11.3* 11.8* 12.0  HCT 34* 37 37  PLT  --  279 275   Lipid Panel     Component Value Date/Time   CHOL 110 10/23/2015   TRIG 80 10/23/2015   HDL 47 10/23/2015   CHOLHDL 1.6 01/29/2013 0630   VLDL 14 01/29/2013 0630   LDLCALC 47 10/23/2015   Lab Results  Component Value Date   HGBA1C 7.0 10/23/2015   Lab Results  Component Value Date   TSH 1.11 10/23/2015   Urine microalbuminuria +   Assessment/Plan  Dm type 2 with microalbuminuria Lab Results  Component Value Date   HGBA1C 7.0 10/23/2015  monitor cbg. Continue tradjenta 5 mg daily. Continue lisinopril for renal protection  ckd stage 3 With history of DM and HTN. Continue lisinopril for renal protection and monitor BMP  Right leg pain Has history of chronic pain syndrome and neuropathy. Will have PMR consult to evaluate and treat pain further. Currently on tylenol and norco 5-325 mg on need basis. Monitor  Vitamin d def Low vitamin d level. Continue vitamin d supplement and monitor  Dementia Continue aricept for now and continue supportive care    Oneal Grout, MD Internal Medicine [facility] [address] [place], [place] [address] Cell Phone (Monday-Friday 8 am - 5 pm):  [phone] On Call: [phone] and follow prompts after 5 pm and on weekends Office Phone: [phone] Office Fax: [phone]

## 2015-12-11 DIAGNOSIS — F0390 Unspecified dementia without behavioral disturbance: Secondary | ICD-10-CM | POA: Insufficient documentation

## 2015-12-11 DIAGNOSIS — R809 Proteinuria, unspecified: Secondary | ICD-10-CM

## 2015-12-11 DIAGNOSIS — E1129 Type 2 diabetes mellitus with other diabetic kidney complication: Secondary | ICD-10-CM | POA: Insufficient documentation

## 2015-12-11 DIAGNOSIS — E559 Vitamin D deficiency, unspecified: Secondary | ICD-10-CM | POA: Insufficient documentation

## 2016-01-04 ENCOUNTER — Non-Acute Institutional Stay (SKILLED_NURSING_FACILITY): Payer: Medicare Other | Admitting: Internal Medicine

## 2016-01-04 ENCOUNTER — Encounter: Payer: Self-pay | Admitting: Internal Medicine

## 2016-01-04 DIAGNOSIS — G8929 Other chronic pain: Secondary | ICD-10-CM

## 2016-01-04 DIAGNOSIS — I482 Chronic atrial fibrillation, unspecified: Secondary | ICD-10-CM

## 2016-01-04 DIAGNOSIS — E1149 Type 2 diabetes mellitus with other diabetic neurological complication: Secondary | ICD-10-CM | POA: Diagnosis not present

## 2016-01-04 DIAGNOSIS — T402X5A Adverse effect of other opioids, initial encounter: Secondary | ICD-10-CM

## 2016-01-04 DIAGNOSIS — K5903 Drug induced constipation: Secondary | ICD-10-CM | POA: Diagnosis not present

## 2016-01-04 DIAGNOSIS — I10 Essential (primary) hypertension: Secondary | ICD-10-CM

## 2016-01-04 NOTE — Progress Notes (Signed)
Patient ID: [name], female   DOB: March 24, 1946, 70 y.o.   MRN: [id number]    LOCATION: [facility]  PCP: [name], MD   Code Status: DNR  Goals of care: Advanced Directive information Advanced Directives 09/21/2015  Does patient have an advance directive? Yes  Type of Advance Directive Out of facility DNR (pink MOST or yellow form)  Does patient want to make changes to advanced directive? No - Patient declined  Copy of advanced directive(s) in chart? Yes    Extended Emergency Contact Information Primary Emergency Contact: [name] Address: [address]          [place], [place] [address] [place] Phone: [phone] Relation: Daughter Secondary Emergency Contact: [facility] Address: [address] [place], [place] [address] [place] Home Phone: [phone] Work Phone: [phone] Relation: Sister   No Known Allergies  Chief Complaint  Patient presents with  . Medical Management of Chronic Issues    Routine Visit     HPI:  Patient is a 70 y.o. female seen today for routine visit. Her pain is under control with current pain regimen. bp reading over last 1 week has been stable. She has been at her baseline per staff.   Review of Systems:  Constitutional: Negative for fever.  HENT: Negative for headache, congestion   Eyes: Negative for blurred vision Respiratory: Negative for cough, shortness of breath Cardiovascular: Negative for chest pain, palpitations Gastrointestinal: Negative for heartburn, nausea, vomiting, abdominal pain. Genitourinary: Negative for dysuria Musculoskeletal: Negative for back pain.  Skin: Negative for itching, rash.  Neurological: Negative for dizziness. Psychiatric/Behavioral: Negative for depression. has memory loss.    Past Medical History  Diagnosis Date  . Hypertension   . Diabetes mellitus   . Glaucoma   . Cataract   . Stroke (HCC)   . Renal disorder   .  Atrial fibrillation (HCC)    No past surgical history on file. Social History:   reports that she has never smoked. She does not have any smokeless tobacco history on file. She reports that she does not drink alcohol or use illicit drugs.  Family History  Problem Relation Age of Onset  . Stroke Mother   . Diabetes type II Mother   . CAD Father     Medications:   Medication List       This list is accurate as of: 01/04/16  9:17 AM.  Always use your most recent med list.               acetaminophen 500 MG tablet  Commonly known as:  TYLENOL  Take 500 mg by mouth daily. At 2 pm     acetaminophen 325 MG tablet  Commonly known as:  TYLENOL  Take 650 mg by mouth every 6 (six) hours as needed for mild pain.     BIOFREEZE 4 % Gel  Generic drug:  Menthol (Topical Analgesic)  Apply topically 3 (three) times daily. Reported on 01/04/2016     cetirizine 10 MG tablet  Commonly known as:  ZYRTEC  Take 10 mg by mouth daily.     citalopram 10 MG tablet  Commonly known as:  CELEXA  Take 20 mg by mouth daily.     cloNIDine 0.1 MG tablet  Commonly known as:  CATAPRES  Take 0.1 mg by mouth daily as needed (For BP >170/90).     cloNIDine 0.2 MG tablet  Commonly known as:  CATAPRES  Take 0.2 mg by mouth 3 (three) times daily.     diltiazem 120 MG tablet  Commonly known as:  CARDIZEM  Take 120 mg by mouth daily.     docusate sodium 100 MG capsule  Commonly known as:  COLACE  Take 100 mg by mouth 2 (two) times daily. Hold for loose stools     donepezil 10 MG tablet  Commonly known as:  ARICEPT  Take 10 mg by mouth at bedtime.     doxazosin 2 MG tablet  Commonly known as:  CARDURA  Take 2 mg by mouth daily.     ferrous sulfate 325 (65 FE) MG tablet  Take 325 mg by mouth 2 (two) times daily with a meal.     HYDROcodone-acetaminophen 5-325 MG tablet  Commonly known as:  NORCO/VICODIN  Take two tablet by mouth every 6 hours as needed for pain     linagliptin 5 MG Tabs  tablet  Commonly known as:  TRADJENTA  Take 5 mg by mouth daily.     lisinopril 10 MG tablet  Commonly known as:  PRINIVIL,ZESTRIL  Take 10 mg by mouth daily.     omeprazole 20 MG capsule  Commonly known as:  PRILOSEC  Take 20 mg by mouth daily.     polyethylene glycol packet  Commonly known as:  MIRALAX / GLYCOLAX  Take 17 g by mouth daily.     pregabalin 50 MG capsule  Commonly known as:  LYRICA  Take one capsule by mouth three times daily for pains     Rivaroxaban 15 MG Tabs tablet  Commonly known as:  XARELTO  Take 1 tablet (15 mg total) by mouth daily with supper.     SYSTANE ULTRA 0.4-0.3 % Soln  Generic drug:  Polyethyl Glycol-Propyl Glycol  Apply 1 drop to eye 2 (two) times daily. Both eyes.  Give 3-5 minutes between 2 different eye meds     traMADol 50 MG tablet  Commonly known as:  ULTRAM  Take 50 mg by mouth 2 (two) times daily.     vitamin C 500 MG tablet  Commonly known as:  ASCORBIC ACID  Take 500 mg by mouth 2 (two) times daily.     Vitamin D3 50000 units Caps  Take 1 capsule by mouth every 30 (thirty) days.        Immunizations: Immunization History  Administered Date(s) Administered  . Influenza-Unspecified 05/23/2015     Physical Exam: Filed Vitals:   01/04/16 0909  BP: 136/72  Pulse: 84  Temp: 97.9 F (36.6 C)  TempSrc: Oral  Resp: 18  Height: 5\' 3"  (1.6 m)  Weight: 246 lb 3.2 oz (111.676 kg)   Body mass index is 43.62 kg/(m^2).  General- elderly female, obese, in no acute distress Head- normocephalic, atraumatic Nose- no nasal discharge Throat- moist mucus membrane, poor dentition with missing teeth Eyes- PERRLA, EOMI, no pallor, no icterus, no discharge, normal conjunctiva, normal sclera Neck- no cervical lymphadenopathy Cardiovascular- normal s1,s2, no murmurs, no leg edema, diminished pedal pulses Respiratory- bilateral clear to auscultation, no wheeze, no rhonchi, no crackles, no use of accessory muscles Abdomen- bowel  sounds present, soft, non tender Musculoskeletal- able to move all 4 extremities, generalized weakness, strength 4/5 in UE and 3/5 in LE, ROM on right shoulder limited > left shoulder, limited ROM to both hips right > left Neurological- alert and oriented to person only Skin- warm and dry, no skin breakdown Psychiatry- normal  mood and affect    Labs reviewed: Basic Metabolic Panel:  Recent Labs  16/10/96 08/31/15 10/23/15  NA 140 139 140  K 4.6 4.4 5.1  BUN 33* 48* 34*  CREATININE 1.9* 1.9* 1.8*   Liver Function Tests:  Recent Labs  08/31/15 10/23/15  AST 15 10*  ALT 13 9  ALKPHOS 94 83   CBC:  Recent Labs  06/07/15 08/31/15 10/23/15  WBC  --  5.5 5.0  HGB 11.3* 11.8* 12.0  HCT 34* 37 37  PLT  --  279 275   Lipid Panel     Component Value Date/Time   CHOL 110 10/23/2015   TRIG 80 10/23/2015   HDL 47 10/23/2015   CHOLHDL 1.6 01/29/2013 0630   VLDL 14 01/29/2013 0630   LDLCALC 47 10/23/2015   Lab Results  Component Value Date   HGBA1C 7.0 10/23/2015   Lab Results  Component Value Date   TSH 1.11 10/23/2015   Urine microalbuminuria +   Assessment/Plan  HTN Stable. Continue clonidine and lisinopril and monitor bp  Diabetic neuropathy Stable, continue lyrica and monitor  Chronic afib Rate controlled. Continue diltiazem and xarelto.  Chronic pain Tolerating reduced dosing of tramadol 50 mg bid with norco 5-325 mg 2 tab q6h prn and tylenol. Follow up with PMR team  Opioid induced constipation Stable, continue colace 100 mg bid and miralax daily. Monitor    Oneal Grout, MD Internal Medicine [facility] [address] [place], [place] [address] Cell Phone (Monday-Friday 8 am - 5 pm): [phone] On Call: [phone] and follow prompts after 5 pm and on weekends Office Phone: [phone] Office Fax: [phone]

## 2016-02-25 LAB — HEPATIC FUNCTION PANEL
ALT: 10 U/L (ref 7–35)
AST: 14 U/L (ref 13–35)
Alkaline Phosphatase: 55 U/L (ref 25–125)
BILIRUBIN, TOTAL: 0.6 mg/dL

## 2016-02-25 LAB — BASIC METABOLIC PANEL
BUN: 30 mg/dL — AB (ref 4–21)
Creatinine: 2.4 mg/dL — AB (ref 0.5–1.1)
GLUCOSE: 132 mg/dL
Potassium: 3.6 mmol/L (ref 3.4–5.3)
Sodium: 142 mmol/L (ref 137–147)

## 2016-03-03 LAB — CBC AND DIFFERENTIAL
HCT: 42 % (ref 36–46)
HEMOGLOBIN: 13.1 g/dL (ref 12.0–16.0)
PLATELETS: 252 10*3/uL (ref 150–399)
WBC: 9.6 10*3/mL

## 2016-03-03 LAB — BASIC METABOLIC PANEL
BUN: 70 mg/dL — AB (ref 4–21)
CREATININE: 5.5 mg/dL — AB (ref 0.5–1.1)
GLUCOSE: 190 mg/dL
POTASSIUM: 4 mmol/L (ref 3.4–5.3)
Sodium: 144 mmol/L (ref 137–147)

## 2016-03-05 LAB — BASIC METABOLIC PANEL
BUN: 47 mg/dL — AB (ref 4–21)
Creatinine: 2.3 mg/dL — AB (ref 0.5–1.1)
Glucose: 152 mg/dL
Potassium: 4.5 mmol/L (ref 3.4–5.3)
SODIUM: 142 mmol/L (ref 137–147)

## 2016-03-06 LAB — BASIC METABOLIC PANEL
BUN: 47 mg/dL — AB (ref 4–21)
CREATININE: 2.3 mg/dL — AB (ref 0.5–1.1)
GLUCOSE: 152 mg/dL
POTASSIUM: 4.5 mmol/L (ref 3.4–5.3)
SODIUM: 142 mmol/L (ref 137–147)

## 2016-03-10 LAB — BASIC METABOLIC PANEL
BUN: 16 mg/dL (ref 4–21)
CREATININE: 1.5 mg/dL — AB (ref 0.5–1.1)
Glucose: 120 mg/dL
Potassium: 4 mmol/L (ref 3.4–5.3)
Sodium: 140 mmol/L (ref 137–147)

## 2016-03-12 ENCOUNTER — Encounter: Payer: Self-pay | Admitting: Internal Medicine

## 2016-03-12 ENCOUNTER — Non-Acute Institutional Stay (SKILLED_NURSING_FACILITY): Payer: Medicare Other | Admitting: Internal Medicine

## 2016-03-12 DIAGNOSIS — R627 Adult failure to thrive: Secondary | ICD-10-CM

## 2016-03-12 DIAGNOSIS — G8929 Other chronic pain: Secondary | ICD-10-CM | POA: Diagnosis not present

## 2016-03-12 DIAGNOSIS — M792 Neuralgia and neuritis, unspecified: Secondary | ICD-10-CM

## 2016-03-12 DIAGNOSIS — D631 Anemia in chronic kidney disease: Secondary | ICD-10-CM

## 2016-03-12 DIAGNOSIS — E1142 Type 2 diabetes mellitus with diabetic polyneuropathy: Secondary | ICD-10-CM | POA: Diagnosis not present

## 2016-03-12 DIAGNOSIS — I482 Chronic atrial fibrillation, unspecified: Secondary | ICD-10-CM

## 2016-03-12 DIAGNOSIS — N179 Acute kidney failure, unspecified: Secondary | ICD-10-CM | POA: Diagnosis not present

## 2016-03-12 DIAGNOSIS — N183 Chronic kidney disease, stage 3 unspecified: Secondary | ICD-10-CM

## 2016-03-12 DIAGNOSIS — N189 Chronic kidney disease, unspecified: Secondary | ICD-10-CM

## 2016-03-12 DIAGNOSIS — I1310 Hypertensive heart and chronic kidney disease without heart failure, with stage 1 through stage 4 chronic kidney disease, or unspecified chronic kidney disease: Secondary | ICD-10-CM

## 2016-03-12 NOTE — Progress Notes (Signed)
Patient ID: [name], female   DOB: July 21, 1946, 70 y.o.   MRN: [id number]    LOCATION: [facility]  PCP: Murray Hodgkins, MD   Code Status: DNR  Goals of care: Advanced Directive information Advanced Directives 09/21/2015  Does patient have an advance directive? Yes  Type of Advance Directive Out of facility DNR (pink MOST or yellow form)  Does patient want to make changes to advanced directive? No - Patient declined  Copy of advanced directive(s) in chart? Yes  Pre-existing out of facility DNR order (yellow form or pink MOST form) -    Extended Emergency Contact Information Primary Emergency Contact: [facility] Address: [address]          [place], [place] [address] [place] Phone: [phone] Relation: Daughter Secondary Emergency Contact: [name],[name] Address: [address] [place], [place] [address] [place] Home Phone: [phone] Work Phone: [phone] Relation: Sister   No Known Allergies  Chief Complaint  Patient presents with  . Medical Management of Chronic Issues    Routine Visit     HPI:  Patient is a 70 y.o. female seen today for routine visit. She has had active decline since my last visit. She was noted to have acute on chronic renal failure with rise in cr to 5.5 and had change in her mental state. She required iv fluids following which her renal function improved some. She is seen in her room today with her sisters at bedside. She does not participate in HPI and ROS. Per nursing staff, she has been refusing food and medications.   Review of Systems: unable to obtain   Past Medical History:  Diagnosis Date  . Atrial fibrillation (HCC)   . Cataract   . Diabetes mellitus   . Glaucoma   . Hypertension   . Renal disorder   . Stroke [facility])    No past surgical history on file. Social History:   reports that she has never smoked. She does not have any smokeless tobacco history on file.  She reports that she does not drink alcohol or use drugs.  Family History  Problem Relation Age of Onset  . Stroke Mother   . Diabetes type II Mother   . CAD Father     Medications:   Medication List       Accurate as of 03/12/16  3:05 PM. Always use your most recent med list.          acetaminophen 325 MG tablet Commonly known as:  TYLENOL Take 650 mg by mouth every 6 (six) hours as needed for mild pain.   BIOFREEZE 4 % Gel Generic drug:  Menthol (Topical Analgesic) Apply topically 3 (three) times daily. Reported on 01/04/2016   cetirizine 10 MG tablet Commonly known as:  ZYRTEC Take 10 mg by mouth daily.   citalopram 20 MG tablet Commonly known as:  CELEXA Take 20 mg by mouth daily.   cloNIDine 0.1 MG tablet Commonly known as:  CATAPRES Take 0.1 mg by mouth daily as needed (For BP >170/90).   cloNIDine 0.1 mg/24hr patch Commonly known as:  CATAPRES - Dosed in mg/24 hr Place 0.1 mg onto the skin once a week.   diltiazem 120 MG tablet Commonly known as:  CARDIZEM Take 120 mg by mouth daily.   docusate sodium 100 MG capsule Commonly known as:  COLACE Take 100 mg by mouth 2 (two) times daily. Hold  for loose stools   donepezil 10 MG tablet Commonly known as:  ARICEPT Take 10 mg by mouth at bedtime.   doxazosin 2 MG tablet Commonly known as:  CARDURA Take 2 mg by mouth daily.   ferrous sulfate 325 (65 FE) MG tablet Take 325 mg by mouth 2 (two) times daily with a meal.   HYDROcodone-acetaminophen 5-325 MG tablet Commonly known as:  NORCO/VICODIN Take two tablet by mouth every 6 hours as needed for pain   linagliptin 5 MG Tabs tablet Commonly known as:  TRADJENTA Take 5 mg by mouth daily.   lisinopril 20 MG tablet Commonly known as:  PRINIVIL,ZESTRIL Take 20 mg by mouth daily.   omeprazole 20 MG capsule Commonly known as:  PRILOSEC Take 20 mg by mouth daily.   polyethylene glycol packet Commonly known as:  MIRALAX / GLYCOLAX Take 17 g by mouth  daily.   pregabalin 50 MG capsule Commonly known as:  LYRICA Take one capsule by mouth three times daily for pains   Rivaroxaban 15 MG Tabs tablet Commonly known as:  XARELTO Take 1 tablet (15 mg total) by mouth daily with supper.   SYSTANE ULTRA 0.4-0.3 % Soln Generic drug:  Polyethyl Glycol-Propyl Glycol Apply 1 drop to eye 2 (two) times daily. Both eyes.  Give 3-5 minutes between 2 different eye meds   traMADol 50 MG tablet Commonly known as:  ULTRAM Take 25 mg by mouth 3 (three) times daily.   vitamin C 500 MG tablet Commonly known as:  ASCORBIC ACID Take 500 mg by mouth 2 (two) times daily.   Vitamin D3 50000 units Caps Take 1 capsule by mouth every 30 (thirty) days.       Immunizations: Immunization History  Administered Date(s) Administered  . Influenza-Unspecified 05/23/2015     Physical Exam: Vitals:   03/12/16 1445  BP: (!) 154/98  Pulse: 91  Resp: 20  Temp: 97.2 F (36.2 C)  TempSrc: Oral  SpO2: 96%  Weight: 231 lb 3.2 oz (104.9 kg)  Height:  (1.6 m)   Body mass index is 40.96 kg/m.  General- elderly female, obese, in no acute distress Head- normocephalic, atraumatic Nose- no nasal discharge Throat- dry mucus membrane Eyes- PERRLA, EOMI, no pallor, no icterus, no discharge Neck- no cervical lymphadenopathy Cardiovascular- normal s1,s2, no murmur Respiratory- bilateral clear to auscultation, no wheeze, no rhonchi, no crackles, no use of accessory muscles Abdomen- bowel sounds present, soft, non tender Musculoskeletal- generalized weakness Neurological- unable to assess Skin- warm and dry    Labs reviewed: Basic Metabolic Panel:  Recent Labs  16/10/96 03/06/16 03/10/16  NA 142 142 140  K 4.5 4.5 4.0  BUN 47* 47* 16  CREATININE 2.3* 2.3* 1.5*   Liver Function Tests:  Recent Labs  08/31/15 10/23/15 02/25/16  AST 15 10* 14  ALT ALKPHOS 94 83 55   CBC:  Recent Labs  08/31/15 10/23/15 03/03/16  WBC 5.5 5.0 9.6    HGB 11.8* 12.0 13.1  HCT 37 37 42  PLT 279 275 252   Lipid Panel     Component Value Date/Time   CHOL 110 10/23/2015   TRIG 80 10/23/2015   HDL 47 10/23/2015   CHOLHDL 1.6 01/29/2013 0630   VLDL 14 01/29/2013 0630   LDLCALC 47 10/23/2015   Lab Results  Component Value Date   HGBA1C 7.0 10/23/2015   Lab Results  Component Value Date   TSH 1.11 10/23/2015   Urine microalbuminuria +  Assessment/Plan  Acute renal failure She has ckd stage 3, improved renal function post iv fluids. With poor po intake, decline anticipated. Will get hospice consult for comfort measures  Failure to thrive Refusing feed. Has recieved iv fluids last week. Encourage po intake. Decline anticipated.  Neuropathic pain Continue pregabalin 50 mg tid for pain  Chronic pain Continue tramadol 25 mg tid with norco 5-325 2 tab q6h prn pain with tylenol 650 mg q6h prn  HTN Has been having elevated bp reading. Continue lisinopril 20 mg daily and clonidine patch weekly with prn clonidine.    Chronic afib Rate controlled. Continue diltiazem and xarelto.  Dm type 2 with neuropathy Lab Results  Component Value Date   HGBA1C 7.0 10/23/2015   With her refusing po feed, d/c tradjenta for now with risk for hypoglycemia.   Anemia of chronic disease With her refusing medications and feed, d/c feso4  Reviewed care plan with her sisters. Hospice consult has been placed.   Oneal Grout, MD Internal Medicine [facility] [address] [place], [place] [address] Cell Phone (Monday-Friday 8 am - 5 pm): [phone] On Call: [phone] and follow prompts after 5 pm and on weekends Office Phone: [phone] Office Fax: [phone]

## 2016-04-11 DEATH — deceased
